# Patient Record
Sex: Female | Born: 1980 | Race: White | Hispanic: No | Marital: Married | Smoking: Current some day smoker
Health system: Southern US, Community
[De-identification: ages and names within clinical notes are randomized; demographics above are authoritative.]

## PROBLEM LIST (undated history)

## (undated) DIAGNOSIS — A749 Chlamydial infection, unspecified: Secondary | ICD-10-CM

## (undated) DIAGNOSIS — N946 Dysmenorrhea, unspecified: Secondary | ICD-10-CM

## (undated) DIAGNOSIS — N760 Acute vaginitis: Secondary | ICD-10-CM

## (undated) DIAGNOSIS — F32A Depression, unspecified: Secondary | ICD-10-CM

## (undated) DIAGNOSIS — K626 Ulcer of anus and rectum: Secondary | ICD-10-CM

## (undated) DIAGNOSIS — F329 Major depressive disorder, single episode, unspecified: Secondary | ICD-10-CM

## (undated) DIAGNOSIS — N879 Dysplasia of cervix uteri, unspecified: Secondary | ICD-10-CM

## (undated) DIAGNOSIS — L509 Urticaria, unspecified: Secondary | ICD-10-CM

## (undated) DIAGNOSIS — E559 Vitamin D deficiency, unspecified: Secondary | ICD-10-CM

## (undated) DIAGNOSIS — N83209 Unspecified ovarian cyst, unspecified side: Secondary | ICD-10-CM

## (undated) DIAGNOSIS — B9689 Other specified bacterial agents as the cause of diseases classified elsewhere: Secondary | ICD-10-CM

## (undated) DIAGNOSIS — O24419 Gestational diabetes mellitus in pregnancy, unspecified control: Secondary | ICD-10-CM

## (undated) DIAGNOSIS — R87629 Unspecified abnormal cytological findings in specimens from vagina: Secondary | ICD-10-CM

## (undated) DIAGNOSIS — N920 Excessive and frequent menstruation with regular cycle: Secondary | ICD-10-CM

## (undated) HISTORY — DX: Ulcer of anus and rectum: K62.6

## (undated) HISTORY — DX: Unspecified abnormal cytological findings in specimens from vagina: R87.629

## (undated) HISTORY — DX: Other specified bacterial agents as the cause of diseases classified elsewhere: N76.0

## (undated) HISTORY — DX: Dysplasia of cervix uteri, unspecified: N87.9

## (undated) HISTORY — DX: Urticaria, unspecified: L50.9

## (undated) HISTORY — PX: FINGER SURGERY: SHX640

## (undated) HISTORY — DX: Unspecified ovarian cyst, unspecified side: N83.209

## (undated) HISTORY — DX: Other specified bacterial agents as the cause of diseases classified elsewhere: B96.89

## (undated) HISTORY — DX: Major depressive disorder, single episode, unspecified: F32.9

## (undated) HISTORY — DX: Chlamydial infection, unspecified: A74.9

## (undated) HISTORY — PX: PILONIDAL CYST EXCISION: SHX744

## (undated) HISTORY — PX: CRYOTHERAPY: SHX1416

## (undated) HISTORY — PX: WISDOM TOOTH EXTRACTION: SHX21

## (undated) HISTORY — DX: Gestational diabetes mellitus in pregnancy, unspecified control: O24.419

## (undated) HISTORY — DX: Vitamin D deficiency, unspecified: E55.9

## (undated) HISTORY — DX: Depression, unspecified: F32.A

## (undated) HISTORY — DX: Dysmenorrhea, unspecified: N94.6

## (undated) HISTORY — DX: Excessive and frequent menstruation with regular cycle: N92.0

---

## 2012-03-28 ENCOUNTER — Ambulatory Visit: Payer: Self-pay | Admitting: General Practice

## 2013-03-30 ENCOUNTER — Ambulatory Visit: Payer: Self-pay | Admitting: Podiatry

## 2013-04-06 ENCOUNTER — Ambulatory Visit: Payer: Self-pay | Admitting: Podiatry

## 2013-04-17 ENCOUNTER — Ambulatory Visit (INDEPENDENT_AMBULATORY_CARE_PROVIDER_SITE_OTHER): Payer: No Typology Code available for payment source | Admitting: Podiatry

## 2013-04-17 ENCOUNTER — Encounter: Payer: Self-pay | Admitting: Podiatry

## 2013-04-17 VITALS — BP 113/71 | HR 64 | Resp 16 | Ht 61.0 in | Wt 145.0 lb

## 2013-04-17 DIAGNOSIS — M7750 Other enthesopathy of unspecified foot: Secondary | ICD-10-CM

## 2013-04-17 DIAGNOSIS — Q828 Other specified congenital malformations of skin: Secondary | ICD-10-CM

## 2013-04-17 MED ORDER — TRIAMCINOLONE ACETONIDE 10 MG/ML IJ SUSP
10.0000 mg | Freq: Once | INTRAMUSCULAR | Status: AC
  Administered 2013-04-17: 10 mg

## 2013-04-18 NOTE — Progress Notes (Signed)
Subjective:     Patient ID: Deborah Mccarthy, female   DOB: [birth year], 32 y.o.   MRN: [id number]  HPI patient is found to have lesions plantar aspect left that are improved over previous visit. She has developed inflammation base of fifth metatarsal right with fluid buildup and deep keratotic tissue formation   Review of Systems     Objective:   Physical Exam Neurovascular status intact with a painful inflamed lesion plantar right and lesions noted on both feet that have improved    Assessment:     Inflammation of base of fifth metatarsal right with fluid buildup and lesion formation noted of both feet    Plan:     Injected the base of fifth metatarsal 3 mg Kenalog 5 mg Xylocaine Marcaine mixture and did deep debridement of lesions on both feet with reappoint to occur in 3 months

## 2013-07-17 ENCOUNTER — Ambulatory Visit (INDEPENDENT_AMBULATORY_CARE_PROVIDER_SITE_OTHER): Payer: No Typology Code available for payment source | Admitting: Podiatry

## 2013-07-17 ENCOUNTER — Encounter: Payer: Self-pay | Admitting: Podiatry

## 2013-07-17 VITALS — BP 104/47 | HR 72 | Resp 16

## 2013-07-17 DIAGNOSIS — L84 Corns and callosities: Secondary | ICD-10-CM

## 2013-07-17 DIAGNOSIS — Q828 Other specified congenital malformations of skin: Secondary | ICD-10-CM

## 2013-07-17 NOTE — Progress Notes (Signed)
Subjective:     Patient ID: Deborah Mccarthy, female   DOB: September 18, 1980, 33 y.o.   MRN: [id number]  HPI patient presents with painful calluses of both feet and also the fact that she is now [redacted] weeks pregnant   Review of Systems     Objective:   Physical Exam Neurovascular status intact with severe keratotic lesions sub-left second metatarsal right fifth metatarsal and others that are not as deep    Assessment:     Porokeratosis with keratotic tissue formation    Plan:     Debridement of lesions on both feet with no iatrogenic bleeding noted

## 2013-11-13 ENCOUNTER — Encounter: Payer: Self-pay | Admitting: Podiatry

## 2013-11-13 ENCOUNTER — Ambulatory Visit (INDEPENDENT_AMBULATORY_CARE_PROVIDER_SITE_OTHER): Payer: No Typology Code available for payment source | Admitting: Podiatry

## 2013-11-13 DIAGNOSIS — Q828 Other specified congenital malformations of skin: Secondary | ICD-10-CM

## 2013-11-13 DIAGNOSIS — L84 Corns and callosities: Secondary | ICD-10-CM

## 2013-11-13 NOTE — Progress Notes (Signed)
Subjective:     Patient ID: Deborah Mccarthy, female   DOB: May 28, 1980, 33 y.o.   MRN: [id number]  HPI patient has extremely thick calluses on the bottom of both feet with the left one being nucleated and more painful   Review of Systems     Objective:   Physical Exam Neurovascular status intact with severe thick keratotic lesions plantar aspect of both feet with a nucleated core left    Assessment:     Corn callus with a porokeratosis of the left second metatarsal    Plan:     Debridement lesions on both feet and reappoint when symptomatic

## 2014-01-04 ENCOUNTER — Ambulatory Visit: Payer: No Typology Code available for payment source | Admitting: Podiatry

## 2014-01-04 ENCOUNTER — Ambulatory Visit (INDEPENDENT_AMBULATORY_CARE_PROVIDER_SITE_OTHER): Payer: No Typology Code available for payment source | Admitting: Podiatry

## 2014-01-04 VITALS — BP 110/63 | HR 82 | Resp 16

## 2014-01-04 DIAGNOSIS — L84 Corns and callosities: Secondary | ICD-10-CM

## 2014-01-04 DIAGNOSIS — M216X9 Other acquired deformities of unspecified foot: Secondary | ICD-10-CM

## 2014-01-04 DIAGNOSIS — M2040 Other hammer toe(s) (acquired), unspecified foot: Secondary | ICD-10-CM

## 2014-01-04 NOTE — Progress Notes (Signed)
Subjective:     Patient ID: Deborah Mccarthy, female   DOB: March 12, 1981, 33 y.o.   MRN: [id number]  HPI patient presents stating this lesion on the bottom my foot has awful and I may have to have it fixed. I'm due to have my baby in the beginning of October and I will be off of work for 3 months sore immediately get it done during that time   Review of Systems  All other systems reviewed and are negative.      Objective:   Physical Exam Neurovascular status intact with muscle strength adequate and range of motion subtalar and midtarsal joint within normal limits. Patient's found to have a painful keratotic lesion second metatarsal left and elevation of the second toe with inflammation and pain around the lesion itself. Also has lesions on the fifth toe and elevation of the fourth toe left that can become painful    Assessment:     Plantarflexed metatarsal with plantar keratotic lesion it's becoming increasingly tender and is not responding well to aggressive trimming    Plan:     Reviewed condition and discussed possibilities for elevating osteotomy removal of plantar skin wedge and correction of hammertoe second fourth and fifth toes left foot teary at we will see her back when symptomatic again and this decision will be deferred till then with possibility for surgical intervention depending on response to trimming which was accomplished today

## 2014-02-22 ENCOUNTER — Ambulatory Visit (INDEPENDENT_AMBULATORY_CARE_PROVIDER_SITE_OTHER): Payer: No Typology Code available for payment source | Admitting: Podiatry

## 2014-02-22 DIAGNOSIS — Q667 Congenital pes cavus: Secondary | ICD-10-CM

## 2014-02-22 DIAGNOSIS — M216X9 Other acquired deformities of unspecified foot: Secondary | ICD-10-CM

## 2014-02-22 DIAGNOSIS — L84 Corns and callosities: Secondary | ICD-10-CM

## 2014-02-24 NOTE — Progress Notes (Signed)
Subjective:     Patient ID: Deborah Mccarthy, female   DOB: Sep 04, 1980, 33 y.o.   MRN: [id number]  HPI patient presents stating the callus on my left foot is really bothering me and I had relief for about 4 weeks. Due to have my baby in 2 weeks   Review of Systems     Objective:   Physical Exam Neurovascular status intact with continued severe keratotic lesion subsecond metatarsal left with deep core noted    Assessment:     Lesion that is due to mechanical dysfunction and keratotic tissue formation    Plan:     Debris the lesion and discussed possibility for surgery during postop recovery. Which we may or may not do depending on continued response

## 2014-03-12 ENCOUNTER — Observation Stay: Payer: Self-pay

## 2014-03-13 ENCOUNTER — Inpatient Hospital Stay: Payer: Self-pay | Admitting: Obstetrics and Gynecology

## 2014-03-13 LAB — CBC WITH DIFFERENTIAL/PLATELET
Basophil #: 0.3 10*3/uL — ABNORMAL HIGH (ref 0.0–0.1)
Basophil %: 1.1 %
EOS PCT: 0.4 %
Eosinophil #: 0.1 10*3/uL (ref 0.0–0.7)
HCT: 35.3 % (ref 35.0–47.0)
HGB: 11.1 g/dL — ABNORMAL LOW (ref 12.0–16.0)
LYMPHS ABS: 1.8 10*3/uL (ref 1.0–3.6)
Lymphocyte %: 7.6 %
MCH: 28.8 pg (ref 26.0–34.0)
MCHC: 31.4 g/dL — ABNORMAL LOW (ref 32.0–36.0)
MCV: 92 fL (ref 80–100)
MONO ABS: 1.6 x10 3/mm — AB (ref 0.2–0.9)
Monocyte %: 6.5 %
NEUTROS ABS: 20.3 10*3/uL — AB (ref 1.4–6.5)
Neutrophil %: 84.4 %
PLATELETS: 340 10*3/uL (ref 150–440)
RBC: 3.85 10*6/uL (ref 3.80–5.20)
RDW: 12.9 % (ref 11.5–14.5)
WBC: 24 10*3/uL — AB (ref 3.6–11.0)

## 2014-03-13 LAB — GC/CHLAMYDIA PROBE AMP

## 2014-03-14 LAB — HEMATOCRIT: HCT: 27.9 % — AB (ref 35.0–47.0)

## 2014-03-15 LAB — URINE CULTURE

## 2014-03-25 ENCOUNTER — Encounter: Payer: Self-pay | Admitting: Podiatry

## 2014-04-05 ENCOUNTER — Ambulatory Visit (INDEPENDENT_AMBULATORY_CARE_PROVIDER_SITE_OTHER): Payer: No Typology Code available for payment source | Admitting: Podiatry

## 2014-04-05 ENCOUNTER — Encounter: Payer: Self-pay | Admitting: Podiatry

## 2014-04-05 VITALS — BP 116/59 | HR 91 | Resp 16

## 2014-04-05 DIAGNOSIS — L84 Corns and callosities: Secondary | ICD-10-CM

## 2014-04-05 DIAGNOSIS — M216X9 Other acquired deformities of unspecified foot: Secondary | ICD-10-CM

## 2014-04-05 DIAGNOSIS — Q667 Congenital pes cavus: Secondary | ICD-10-CM

## 2014-04-07 NOTE — Progress Notes (Signed)
Subjective:     Patient ID: Deborah Mccarthy, female   DOB: 09-18-1980, 33 y.o.   MRN: [id number]  HPIpatient presents with painful sub-callus second metatarsal left that is very painful when pressed and is causing difficulty with walking. She is just had her child and at this time has not been weightbearing so it's not been as tender as previously   Review of Systems     Objective:   Physical Exam Neurovascular status intact no change in health history with severe keratotic lesion subsecond metatarsal left it's painful when pressed and makes ambulation difficult    Assessment:     Plantar flexed second metatarsal left with pain when pressed    Plan:     H&P was performed condition discussed and reviewed again elevating osteotomy with excision of plantar lesion. Today deep debridement of lesion accomplished which will be done for the short-term with eventual surgery which will be necessary due to the long-term nature of this condition

## 2014-08-09 ENCOUNTER — Ambulatory Visit (INDEPENDENT_AMBULATORY_CARE_PROVIDER_SITE_OTHER): Payer: No Typology Code available for payment source | Admitting: Podiatry

## 2014-08-09 DIAGNOSIS — M7752 Other enthesopathy of left foot: Secondary | ICD-10-CM

## 2014-08-09 DIAGNOSIS — M779 Enthesopathy, unspecified: Secondary | ICD-10-CM

## 2014-08-09 DIAGNOSIS — L84 Corns and callosities: Secondary | ICD-10-CM | POA: Diagnosis not present

## 2014-08-09 MED ORDER — TRIAMCINOLONE ACETONIDE 10 MG/ML IJ SUSP
10.0000 mg | Freq: Once | INTRAMUSCULAR | Status: AC
  Administered 2014-08-09: 10 mg

## 2014-08-09 NOTE — Progress Notes (Signed)
Subjective:     Patient ID: Deborah Mccarthy, female   DOB: Aug 15, 1980, 34 y.o.   MRN: [id number]  HPI presents stating that the calluses started to hurt me again and there is 1 on my right foot that's inflamed with fluid and it is also sore   Review of Systems     Objective:   Physical Exam Neurovascular status intact with muscle strength adequate and noted to have severe keratotic lesion subsecond metatarsal left sub-fifth metatarsal right with fluid buildup around the base of the fifth metatarsal right and pain noted with pressure with fluid accumulation    Assessment:     Inflammatory capsulitis base of fifth metatarsal right and keratotic lesion second metatarsal left fifth metatarsal right    Plan:     Injected the base of the fifth metatarsal 3 mg dexamethasone Kenalog 5 mg Xylocaine and did deep debridement of lesion on right and left foot with no iatrogenic bleeding noted

## 2014-09-06 ENCOUNTER — Ambulatory Visit: Payer: No Typology Code available for payment source | Admitting: Podiatry

## 2014-10-01 NOTE — H&P (Signed)
L&D Evaluation:  History:  HPI 34 year old G6 P10 with EDC=03/07/14 by LMP=05/31/2013 and c/w 6wk5d ultrasound presents at 40 6/7 weeks with c/o intense ctxs since 0300 this AM. Was seen yesterday in L&D for ctxs and sent home with cx dilated to 1 cm.  She has progressed to 4 cm. Denies VB (had spotting after membranes stripped) or LOF. Recently had cough, nasal/sinus congestion...which has largely resolved. No fever, dysuria, sore throat. Prenatal care at [facility] remarkable for Chlamydia and Trichimonas (negative TOC), AMA (normal Informaseq and normal anatomy scan), and mild anemia. Patient is a smoker(cigars)-stopped smoking early pregnancy.  LABS: A POS, VI, RI, GBS negative. Elevated one hr=140 with normal 3hr GTT. Given TDAP 8/6. Desires to breastfeed. Plans minipill. OB HX: SVD 2005 7#5oz female. Elective AB 2007, 2014. SAB 2009   Presents with contractions   Patient's Medical History cervical dysplasia   Patient's Surgical History cryo 2008, finger surgery 2004, excision of pilonidal cyst 2008, wisdom teeth extraction 2002   Medications Pre Natal Vitamins   Allergies NKDA   Social History tobacco   Family History Non-Contributory   ROS:  ROS see HPI   Exam:  Vital Signs stable  afebrile   General breathing with contractions   Mental Status clear   Chest rhonchi cleared with cough   Heart normal sinus rhythm, no murmur/gallop/rubs   Abdomen gravid, tender with contractions   Estimated Fetal Weight Average for gestational age, 7-10 EFW   Fetal Position cephalic   Edema 1+   Pelvic 4cm/75%/-1 on arrival, now 6cm per RN exam   Mebranes Intact   FHT normal rate with no decels, 140s with minimal variability, initially, improved with IV fluids   Ucx q4-5 with coupling   Skin dry   Other WBC=24K   Impression:  Impression IUP at 40 6/7 weeks in labor. Leukocytosis   Plan:  Plan EFM/NST, monitor contractions and for cervical change, fluids, Stadol for pain  which she has already received. Anesthesia consulted for epidural-won't be able to do due to elevated WBCs.   Electronic Signatures: Karene Fry (CNM)  (Signed 21-Oct-15 08:51)  Authored: L&D Evaluation   Last Updated: 21-Oct-15 08:51 by Karene Fry (CNM)

## 2014-12-19 ENCOUNTER — Ambulatory Visit (INDEPENDENT_AMBULATORY_CARE_PROVIDER_SITE_OTHER): Payer: PRIVATE HEALTH INSURANCE | Admitting: Podiatry

## 2014-12-19 DIAGNOSIS — Q828 Other specified congenital malformations of skin: Secondary | ICD-10-CM

## 2014-12-19 DIAGNOSIS — B070 Plantar wart: Secondary | ICD-10-CM | POA: Diagnosis not present

## 2014-12-19 NOTE — Progress Notes (Signed)
Subjective:     Patient ID: Deborah Mccarthy, female   DOB: [birth year], 34 y.o.   MRN: [id number]  HPI 34 year old female presents the office today for follow-up evaluation and continued care of calluses to bilateral feet. She states that she periodically comes the office of the areas trimmed once the become painful. She states that of left Achilles insert become painful particularly with weightbearing. She denies any redness or drainage. Denies any acute changes since last appointment and no other complaints at this time.  Review of Systems  All other systems reviewed and are negative.      Objective:   Physical Exam AAO x3, NAD DP/PT pulses palpable bilaterally, CRT less than 3 seconds Protective sensation intact with Simms Weinstein monofilament, Achilles tendon reflex intact Submetatarsal 2 and 5 bilaterally are hyperkeratotic lesions. Lesions are also present to medial hallux. Particularly submetatarsal 2 are deep deep, annular lesions. Upon debridement there is no underlying ulceration, drainage or other clinical signs of infection. There is tenderness to palpation overlying hyperkeratotic lesions prior to debridement. No areas of tenderness to bilateral lower extremities. MMT 5/5, ROM WNL.  No open lesions or pre-ulcerative lesions.  No overlying edema, erythema, increase in warmth to bilateral lower extremities.  No pain with calf compression, swelling, warmth, erythema bilaterally.      Assessment:     Symptomatic porokeratosis    Plan:     -Treatment options discussed including all alternatives, risks, and complications -Lesions were sharply debrided 6 without complication/bleeding. Over submetatarsal 2 a donut pad was applied followed by salinocaine and a DSD. Post procedure instructions were discussed the patient. Monitor for any clinical signs or symptoms of infection and directed to call the office immediately should any occur or go to the ER. -Follow-up as needed or sooner  if any problems arise. In the meantime, encouraged to call the office with any questions, concerns, change in symptoms.   Celesta Gentile, DPM

## 2015-07-09 ENCOUNTER — Other Ambulatory Visit: Payer: Self-pay | Admitting: Emergency Medicine

## 2015-07-09 MED ORDER — BUPROPION HCL ER (XL) 300 MG PO TB24: 300.0000 mg | ORAL_TABLET | Freq: Every day | ORAL | Status: DC

## 2015-07-09 NOTE — Telephone Encounter (Signed)
Received a faxed medication request from [facility] [place] [address].  Please advise.  Thank you.

## 2015-07-09 NOTE — Telephone Encounter (Signed)
ok'd refill on wellbutrin, refused request for flexeril, don't like for patients to be on this for long period of time

## 2015-07-11 ENCOUNTER — Encounter: Payer: Self-pay | Admitting: Physician Assistant

## 2015-07-11 ENCOUNTER — Ambulatory Visit: Payer: Self-pay | Admitting: Physician Assistant

## 2015-07-11 VITALS — BP 119/65 | HR 78 | Temp 98.3°F

## 2015-07-11 DIAGNOSIS — M62838 Other muscle spasm: Secondary | ICD-10-CM

## 2015-07-11 MED ORDER — METHOCARBAMOL 750 MG PO TABS: 750.0000 mg | ORAL_TABLET | Freq: Four times a day (QID) | ORAL | Status: DC

## 2015-07-11 NOTE — Progress Notes (Signed)
   Subjective:Neck muscle spasm    Patient ID: Deborah Mccarthy, female    DOB: Aug 30, 1980, 35 y.o.   MRN: [id number]  HPI Patient c/o muscle spasms radiating from neck to left shoulder. States increase spasm after Chiropractor sessions. Denies loss of sensation or strength.    Review of Systems Chronic neck/back pain.    Objective:   Physical Exam No spinal deformity. F/E ROM of neck. Left Scapular muscle spasms with over head reaching.       Assessment & Plan:Left scapular muscle strain/spasm. Trial of Robaxin..  Follow if no improvement or worsen of compliant.

## 2015-10-02 ENCOUNTER — Encounter: Payer: Self-pay | Admitting: Podiatry

## 2015-10-02 ENCOUNTER — Ambulatory Visit (INDEPENDENT_AMBULATORY_CARE_PROVIDER_SITE_OTHER): Payer: Managed Care, Other (non HMO) | Admitting: Podiatry

## 2015-10-02 DIAGNOSIS — L84 Corns and callosities: Secondary | ICD-10-CM

## 2015-10-02 DIAGNOSIS — Q828 Other specified congenital malformations of skin: Secondary | ICD-10-CM

## 2015-10-02 NOTE — Progress Notes (Signed)
Patient ID: Deborah Mccarthy, female   DOB: [birth year], 35 y.o.   MRN: [id number]  Subjective: 35 year old female presents the office today for follow-up evaluation and continued care of calluses to bilateral feet. She states they have started to become painful again. No redness or swelling or drainage.   Objective: AAO x3, NAD DP/PT pulses palpable bilaterally, CRT less than 3 seconds Submetatarsal 2 and 5 bilaterally are hyperkeratotic lesions. Lesions are also present to medial hallux. Particularly submetatarsal 2 on the left and sub 5 on the right are deep, annular lesions. Upon debridement there is no underlying ulceration, drainage or other clinical signs of infection. There is tenderness to palpation overlying hyperkeratotic lesions prior to debridement. No areas of tenderness to bilateral lower extremities. MMT 5/5, ROM WNL.  No open lesions or pre-ulcerative lesions.  No overlying edema, erythema, increase in warmth to bilateral lower extremities.  No pain with calf compression, swelling, warmth, erythema bilaterally.   Assessment: Symptomatic porokeratosis  Plan: -Treatment options discussed including all alternatives, risks, and complications -Lesions were sharply debrided 6 without complication/bleeding. Over submetatarsal 2 on the left and sub 5 on the right a donut pad was applied followed by salinocaine and a DSD. Post procedure instructions were discussed the patient. Monitor for any clinical signs or symptoms of infection and directed to call the office immediately should any occur or go to the ER. -Follow-up as needed or sooner if any problems arise. In the meantime, encouraged to call the office with any questions, concerns, change in symptoms.   Celesta Gentile, DPM

## 2015-10-02 NOTE — Patient Instructions (Signed)
Can use urea cream.

## 2015-12-25 ENCOUNTER — Encounter: Payer: Self-pay | Admitting: Podiatry

## 2015-12-25 ENCOUNTER — Ambulatory Visit (INDEPENDENT_AMBULATORY_CARE_PROVIDER_SITE_OTHER): Payer: Managed Care, Other (non HMO) | Admitting: Podiatry

## 2015-12-25 DIAGNOSIS — Q828 Other specified congenital malformations of skin: Secondary | ICD-10-CM

## 2015-12-25 DIAGNOSIS — M79673 Pain in unspecified foot: Secondary | ICD-10-CM

## 2015-12-29 NOTE — Progress Notes (Signed)
Patient ID: Deborah Mccarthy, female   DOB: February 09, 1981, 35 y.o.   MRN: [id number]  Subjective: 35 year old female presents the office today for painful calluses to bilateral feet. She states they have started to become painful again. No redness or swelling or drainage. No new complaints.   Objective: AAO x3, NAD DP/PT pulses palpable bilaterally, CRT less than 3 seconds Submetatarsal 2 and 5 bilaterally are hyperkeratotic lesions. Lesions are also present to medial hallux. Particularly submetatarsal 2 on the left and sub 5 on the right are deep, annular lesions. Upon debridement there is no underlying ulceration, drainage or other clinical signs of infection. There is tenderness to palpation overlying hyperkeratotic lesions prior to debridement. No areas of tenderness to bilateral lower extremities. MMT 5/5, ROM WNL.  No open lesions or pre-ulcerative lesions.  No overlying edema, erythema, increase in warmth to bilateral lower extremities.  No pain with calf compression, swelling, warmth, erythema bilaterally.   Assessment: Symptomatic porokeratosis  Plan: -Treatment options discussed including all alternatives, risks, and complications -Lesions were sharply debrided 6 without complication/bleeding. Over submetatarsal 2 on the left and sub 5 on the right a donut pad was applied followed by salinocaine and a DSD. Post procedure instructions were discussed the patient. Monitor for any clinical signs or symptoms of infection and directed to call the office immediately should any occur or go to the ER. -Follow-up as needed or sooner if any problems arise. In the meantime, encouraged to call the office with any questions, concerns, change in symptoms.   Celesta Gentile, DPM

## 2016-02-02 ENCOUNTER — Encounter: Payer: Self-pay | Admitting: Physician Assistant

## 2016-02-02 ENCOUNTER — Ambulatory Visit: Payer: Self-pay | Admitting: Physician Assistant

## 2016-02-02 VITALS — BP 120/60 | HR 72 | Temp 98.7°F

## 2016-02-02 DIAGNOSIS — F329 Major depressive disorder, single episode, unspecified: Secondary | ICD-10-CM

## 2016-02-02 DIAGNOSIS — M542 Cervicalgia: Secondary | ICD-10-CM

## 2016-02-02 DIAGNOSIS — F32A Depression, unspecified: Secondary | ICD-10-CM

## 2016-02-02 MED ORDER — BACLOFEN 10 MG PO TABS: 10.0000 mg | ORAL_TABLET | Freq: Three times a day (TID) | ORAL | 0 refills | Status: DC

## 2016-02-02 MED ORDER — BUPROPION HCL ER (XL) 300 MG PO TB24: 300.0000 mg | ORAL_TABLET | Freq: Every day | ORAL | 11 refills | Status: DC

## 2016-02-02 NOTE — Progress Notes (Signed)
S: here for med refill of wellbutrin , states is doing well on the medication, also ?if can have a rx for muscle relaxer due to trigger point pain in neck/shoulders , doesn't take med all of the time just occasionally, no numbness or tingling  O: vitals wnl, nad, good mood and affect, cspine nontender, + trigger point tenderness in shoulders b/l, grips = b/l, n/v intact  A: med refill, (depression), shoulder and neck painj  P: wellbutrin xl 300mg  qd, baclofen

## 2016-11-01 ENCOUNTER — Ambulatory Visit (INDEPENDENT_AMBULATORY_CARE_PROVIDER_SITE_OTHER): Payer: Managed Care, Other (non HMO) | Admitting: Obstetrics & Gynecology

## 2016-11-01 ENCOUNTER — Encounter: Payer: Self-pay | Admitting: Obstetrics & Gynecology

## 2016-11-01 VITALS — BP 112/58 | HR 87 | Ht 61.0 in | Wt 158.0 lb

## 2016-11-01 DIAGNOSIS — N9089 Other specified noninflammatory disorders of vulva and perineum: Secondary | ICD-10-CM | POA: Diagnosis not present

## 2016-11-01 DIAGNOSIS — Z124 Encounter for screening for malignant neoplasm of cervix: Secondary | ICD-10-CM | POA: Diagnosis not present

## 2016-11-01 DIAGNOSIS — Z01419 Encounter for gynecological examination (general) (routine) without abnormal findings: Secondary | ICD-10-CM

## 2016-11-01 DIAGNOSIS — F17200 Nicotine dependence, unspecified, uncomplicated: Secondary | ICD-10-CM | POA: Diagnosis not present

## 2016-11-01 DIAGNOSIS — Z0000 Encounter for general adult medical examination without abnormal findings: Secondary | ICD-10-CM

## 2016-11-01 MED ORDER — NORETHIN ACE-ETH ESTRAD-FE 1-20 MG-MCG(24) PO CHEW: 1.0000 | CHEWABLE_TABLET | Freq: Every day | ORAL | 12 refills | Status: DC

## 2016-11-01 NOTE — Progress Notes (Signed)
HPI:      [name] is a 36 y.o. G1P0 who LMP was Patient's last menstrual period was 10/01/2016 (approximate)., she presents today for her annual examination. The patient has no complaints today. The patient is sexually active. Her last pap: approximate date 2015 and was normal. The patient does perform self breast exams.  There is notable family history of breast or ovarian cancer in her family.  The patient has regular exercise: yes.  The patient denies current symptoms of depression.  Stopped tobacco use in January.  GYN History: Contraception: OCP (estrogen/progesterone). No periods on this regimen, wants to continue.  PMHx: History reviewed. No pertinent past medical history. History reviewed. No pertinent surgical history. Family History  Problem Relation Age of Onset  . Breast cancer Maternal Grandmother 28  . Colon cancer Other    Social History  Substance Use Topics  . Smoking status: Smoker, Current Status Unknown    Types: Cigarettes  . Smokeless tobacco: Never Used  . Alcohol use No    Current Outpatient Prescriptions:  .  baclofen (LIORESAL) 10 MG tablet, Take 1 tablet (10 mg total) by mouth 3 (three) times daily., Disp: 30 each, Rfl: 0 .  buPROPion (WELLBUTRIN XL) 300 MG 24 hr tablet, Take 1 tablet (300 mg total) by mouth daily., Disp: 30 tablet, Rfl: 11 .  Norethin Ace-Eth Estrad-FE (MIBELAS 24 FE) 1-20 MG-MCG(24) CHEW, Chew 1 tablet by mouth daily., Disp: 28 tablet, Rfl: 12 Allergies: Patient has no known allergies.  Review of Systems  Constitutional: Negative for chills, fever and malaise/fatigue.  HENT: Negative for congestion, sinus pain and sore throat.   Eyes: Negative for blurred vision and pain.  Respiratory: Negative for cough and wheezing.   Cardiovascular: Negative for chest pain and leg swelling.  Gastrointestinal: Negative for abdominal pain, constipation, diarrhea, heartburn, nausea and vomiting.  Genitourinary: Negative for dysuria,  frequency, hematuria and urgency.  Musculoskeletal: Negative for back pain, joint pain, myalgias and neck pain.  Skin: Negative for itching and rash.  Neurological: Negative for dizziness, tremors and weakness.  Endo/Heme/Allergies: Does not bruise/bleed easily.  Psychiatric/Behavioral: Negative for depression. The patient is not nervous/anxious and does not have insomnia.     Objective: BP (!) 112/58 (BP Location: Right Arm, Patient Position: Sitting, Cuff Size: Normal)   Pulse 87   Ht 5\' 1"  (1.549 m)   Wt 158 lb (71.7 kg)   LMP 10/01/2016 (Approximate)   Breastfeeding? No   BMI 29.85 kg/m   Filed Weights   11/01/16 0905  Weight: 158 lb (71.7 kg)   Body mass index is 29.85 kg/m. Physical Exam  Constitutional: She is oriented to person, place, and time. She appears well-developed and well-nourished. No distress.  Genitourinary: Rectum normal, vagina normal and uterus normal. Pelvic exam was performed with patient supine. There is no rash or lesion on the right labia. There is no rash or lesion on the left labia. Vagina exhibits no lesion. No bleeding in the vagina. Right adnexum does not display mass and does not display tenderness. Left adnexum does not display mass and does not display tenderness. Cervix does not exhibit motion tenderness, lesion, friability or polyp.   Uterus is mobile and midaxial. Uterus is not enlarged or exhibiting a mass.  HENT:  Head: Normocephalic and atraumatic. Head is without laceration.  Right Ear: Hearing normal.  Left Ear: Hearing normal.  Nose: No epistaxis.  No foreign bodies.  Mouth/Throat: Uvula is midline, oropharynx is clear and moist and  mucous membranes are normal.  Eyes: Pupils are equal, round, and reactive to light.  Neck: Normal range of motion. Neck supple. No thyromegaly present.  Cardiovascular: Normal rate and regular rhythm.  Exam reveals no gallop and no friction rub.   No murmur heard. Pulmonary/Chest: Effort normal and breath  sounds normal. No respiratory distress. She has no wheezes. Right breast exhibits no mass, no skin change and no tenderness. Left breast exhibits no mass, no skin change and no tenderness.  Abdominal: Soft. Bowel sounds are normal. She exhibits no distension. There is no tenderness. There is no rebound.  Musculoskeletal: Normal range of motion.  Neurological: She is alert and oriented to person, place, and time. No cranial nerve deficit.  Skin: Skin is warm and dry.  Psychiatric: She has a normal mood and affect. Judgment normal.  Vitals reviewed.   Assessment:  ANNUAL EXAM 1. Annual physical exam   2. Screening for cervical cancer   3. Skin tag of female perineum   4. Tobacco dependence    Screening Plan:            1.  Cervical Screening-  Pap smear done today  2. Breast screening- Exam annually and mammogram>40 planned   3. Colonoscopy every 10 years, Hemoccult testing - after age 51  4. Labs managed by PCP  5. Counseling for contraception: oral contraceptives (estrogen/progesterone)  Other:  1. Annual physical exam  2. Screening for cervical cancer  3. Skin tag of female perineum Plan visit for excision, prefers a Friday  4. Tobacco dependence Stopped since January.  If restarts need to discuss again alternatives to OCPs.    F/U  Return Any Friday for procedure visit (any room, skin tag excision).  Barnett Applebaum, MD, [facility], [facility] 11/01/2016  9:46 AM

## 2016-11-01 NOTE — Addendum Note (Signed)
Addended by: Gae Dry on: 11/01/2016 10:59 AM   Modules accepted: Orders

## 2016-11-04 ENCOUNTER — Encounter: Payer: Self-pay | Admitting: Obstetrics & Gynecology

## 2016-11-04 LAB — IGP, APTIMA HPV
HPV Aptima: NEGATIVE
PAP SMEAR COMMENT: 0

## 2016-11-23 ENCOUNTER — Encounter: Payer: Self-pay | Admitting: Obstetrics & Gynecology

## 2016-11-26 ENCOUNTER — Encounter: Payer: Self-pay | Admitting: Obstetrics & Gynecology

## 2016-11-26 ENCOUNTER — Ambulatory Visit (INDEPENDENT_AMBULATORY_CARE_PROVIDER_SITE_OTHER): Payer: Managed Care, Other (non HMO) | Admitting: Obstetrics & Gynecology

## 2016-11-26 VITALS — BP 110/70 | HR 80 | Ht 61.0 in | Wt 157.0 lb

## 2016-11-26 DIAGNOSIS — N9089 Other specified noninflammatory disorders of vulva and perineum: Secondary | ICD-10-CM

## 2016-11-26 NOTE — Progress Notes (Signed)
VULVAR Skin Lesion Excision NOTE The indications for vulvar excision of lesion (rule out neoplasia, establish lichen sclerosus diagnosis) were reviewed.   Risks of the biopsy including pain, bleeding, infection, inadequate specimen, scarring and need for additional procedures  were discussed. The patient stated understanding and agreed to undergo procedure today. Consent was signed,  time out performed.   The patient's vulva was prepped with Betadine. 1% lidocaine was injected into area of concern. A scapel was used to excise raised .75cm skin lesion, with biopsy tissue was picked up with sterile forceps and sterile scissors were used to excise the lesion.  Small bleeding was noted and hemostasis was achieved using3-0 Vicryl Suture.  The patient tolerated the procedure well. Post-procedure instructions  (pelvic rest for one week) were given to the patient. The patient is to call with heavy bleeding, fever greater than 100.4, foul smelling vaginal discharge or other concerns.   F/u 2 weeks for evaluation of healing.  Minimize activity.  Barnett Applebaum, MD, [facility], [facility] 11/26/2016  11:53 AM

## 2016-11-27 ENCOUNTER — Other Ambulatory Visit: Payer: Self-pay | Admitting: Obstetrics & Gynecology

## 2016-11-27 NOTE — Addendum Note (Signed)
Addended by: Gae Dry on: 11/27/2016 08:42 AM   Modules accepted: Orders

## 2016-12-01 LAB — PATHOLOGY

## 2016-12-02 NOTE — Progress Notes (Signed)
Patient called.  Unable to reach patient. Pt has f/u appt scheduled

## 2016-12-16 ENCOUNTER — Encounter: Payer: Self-pay | Admitting: Obstetrics & Gynecology

## 2016-12-16 ENCOUNTER — Ambulatory Visit (INDEPENDENT_AMBULATORY_CARE_PROVIDER_SITE_OTHER): Payer: Managed Care, Other (non HMO) | Admitting: Obstetrics & Gynecology

## 2016-12-16 VITALS — BP 120/80 | HR 99 | Ht 61.0 in | Wt 157.0 lb

## 2016-12-16 DIAGNOSIS — L918 Other hypertrophic disorders of the skin: Secondary | ICD-10-CM

## 2016-12-16 DIAGNOSIS — N9089 Other specified noninflammatory disorders of vulva and perineum: Secondary | ICD-10-CM

## 2016-12-16 NOTE — Progress Notes (Signed)
  History of Present Illness:  Deborah Mccarthy is a 36 y.o. who had excision of vulvar lesion 2 weeks ago, w no c/o pain bleeding or infection since that time. Path revealed fibroepithelial polyp.  Pt has another one near it she would also like removed due to irritation w activity.  PMHx: She  has a past medical history of Abnormal Pap smear of vagina; BV (bacterial vaginosis); Cervical dysplasia; Chlamydia; Dysmenorrhea; Menorrhagia; Ovarian cyst; and Vitamin D deficiency. Also,  has a past surgical history that includes Cryotherapy; Finger surgery; Pilonidal cyst excision; and Wisdom tooth extraction., family history includes Breast cancer (age of onset: 26) in her maternal grandmother; Colon cancer in her maternal grandfather and other; Coronary artery disease in her father and maternal grandmother; Hypertension in her father.,  reports that she has been smoking Cigarettes.  She has never used smokeless tobacco. She reports that she does not drink alcohol or use drugs. No outpatient prescriptions have been marked as taking for the 12/16/16 encounter (Office Visit) with Gae Dry, MD.  . Also, has No Known Allergies..  Review of Systems  All other systems reviewed and are negative.   Physical Exam:  BP 120/80   Pulse 99   Ht 5\' 1"  (1.549 m)   Wt 157 lb (71.2 kg)   BMI 29.66 kg/m  Body mass index is 29.66 kg/m. Constitutional: Well nourished, well developed female in no acute distress.  Abdomen: diffusely non tender to palpation, non distended, and no masses, hernias Neuro: Grossly intact Psych:  Normal mood and affect. GU- well healed right labia where excision took place Left perineal skin tag pigmented and raised  Assessment:  Problem List Items Addressed This Visit      Genitourinary   Skin tag of female perineum   Relevant Orders   Pathology    Other Visit Diagnoses    Fibroepithelial polyp    -  Primary   Relevant Orders   Pathology    Plan: She will undergo  excision of second lesion today. First site well healed.  VULVAR Skin Lesion Excision NOTE The indications for vulvar excision of lesion (rule out neoplasia, establish lichen sclerosus diagnosis) were reviewed. Risks of the biopsy including pain, bleeding, infection, inadequate specimen, scarring and need for additional procedures were discussed. The patient stated understanding and agreed to undergo procedure today. Consent was signed,  time out performed.   The patient's vulva was prepped with Betadine. 1% lidocaine was injected into area of concern. A scapel was used to excise raised .75cm skin lesion, with biopsy tissue was picked up with sterile forceps and sterile scissors were used to excise the lesion. Small bleeding was noted and hemostasis was achieved using3-0 Vicryl Suture.  The patient tolerated the procedure well. Post-procedure instructions (pelvic rest for one week) were given to the patient. The patient is to call with heavy bleeding, fever greater than 100.4, foul smelling vaginal discharge or other concerns.   F/u 2 weeks for evaluation of healing.  Minimize activity.  Barnett Applebaum, MD, [facility], [facility] 12/16/2016  2:33 PM

## 2016-12-20 LAB — PATHOLOGY

## 2017-01-04 ENCOUNTER — Ambulatory Visit (INDEPENDENT_AMBULATORY_CARE_PROVIDER_SITE_OTHER): Payer: Managed Care, Other (non HMO) | Admitting: Obstetrics & Gynecology

## 2017-01-04 ENCOUNTER — Encounter: Payer: Self-pay | Admitting: Obstetrics & Gynecology

## 2017-01-04 VITALS — BP 100/60 | HR 80 | Ht 61.0 in | Wt 153.0 lb

## 2017-01-04 DIAGNOSIS — A630 Anogenital (venereal) warts: Secondary | ICD-10-CM

## 2017-01-04 NOTE — Progress Notes (Signed)
History of Present Illness:  Deborah Mccarthy is a 36 y.o. who had excision of vulvar lesion 6 weeks ago, and another lesion on left buttocks excised 2 weeks ago, w no c/o pain bleeding or infection since that time. 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    PMHx: She  has a past medical history of Abnormal Pap smear of vagina; BV (bacterial vaginosis); Cervical dysplasia; Chlamydia; Dysmenorrhea; Menorrhagia; Ovarian cyst; and Vitamin D deficiency. Also,  has a past surgical history that includes Cryotherapy; Finger surgery; Pilonidal cyst excision; and Wisdom tooth extraction., family history includes Breast cancer (age of onset: 3) in her maternal grandmother; Colon cancer in her maternal grandfather and other; Coronary artery disease in her father and maternal grandmother; Hypertension in her father.,  reports that she has been smoking Cigarettes.  She has never used smokeless tobacco. She reports that she does not drink alcohol or use drugs. Meds- none Also, has No Known Allergies..  Review of Systems  All other systems reviewed and are negative.  Physical Exam:  BP 100/60   Pulse 80   Ht 5\' 1"  (1.549 m)   Wt 153 lb (69.4 kg)   BMI 28.91 kg/m  Constitutional: Well nourished, well developed female in no acute distress.  Abdomen: diffusely non tender to palpation, non distended, and no masses, hernias Neuro: Grossly intact Psych:  Normal mood and affect. GU- well healed right labia where excision took place Left buttocks with eschar at site but no signs of infection or wound breakdown  1. Condyloma acuminata Consider Aldara if future lesions to avoid repeated excisions/ procedures. No new sx's now. Cont to heal Annual/PRN f/u  Deborah Applebaum, MD, [facility], [facility] 01/04/2017  9:15 AM

## 2017-06-27 ENCOUNTER — Other Ambulatory Visit: Payer: Self-pay | Admitting: Maternal Newborn

## 2017-06-27 DIAGNOSIS — Z3041 Encounter for surveillance of contraceptive pills: Secondary | ICD-10-CM

## 2017-06-27 MED ORDER — NORETHIN ACE-ETH ESTRAD-FE 1-20 MG-MCG PO TABS: 1.0000 | ORAL_TABLET | Freq: Every day | ORAL | 6 refills | Status: DC

## 2017-06-27 NOTE — Progress Notes (Signed)
Patient requested new OCP Rx due to cost.  Avel Sensor, CNM 06/27/2017  8:55 PM

## 2017-06-28 ENCOUNTER — Telehealth: Payer: Self-pay

## 2017-06-28 NOTE — Telephone Encounter (Signed)
Pt called after hour nurse c/o bloating and bc too expensive, wants generic.  Was adv to apply heat to lower abdomen.  On call provider called in generic bcp.

## 2017-06-29 NOTE — Telephone Encounter (Signed)
Pt calling triage stating that she has not heard back about RX for OCP generic. Lm with pt informing her that JS has sent in generic to [facility]. This should be more affordable.

## 2017-07-11 ENCOUNTER — Ambulatory Visit: Payer: Self-pay | Admitting: Emergency Medicine

## 2017-07-11 VITALS — BP 122/76 | HR 85 | Temp 98.3°F

## 2017-07-11 DIAGNOSIS — J209 Acute bronchitis, unspecified: Secondary | ICD-10-CM

## 2017-07-11 MED ORDER — AZITHROMYCIN 250 MG PO TABS: ORAL_TABLET | ORAL | 0 refills | Status: DC

## 2017-07-11 MED ORDER — BENZONATATE 200 MG PO CAPS: ORAL_CAPSULE | ORAL | 0 refills | Status: DC

## 2017-07-11 NOTE — Progress Notes (Addendum)
sheSubjective All started 2 weeks ago with sore throat and hoarseness, but that has been improving over the past several days. Then, 4 days ago, symptoms worsened with acute flu symptoms of myalgias, fever, chills, chest congestion and worsening hoarseness. However, the above flu symptoms have seemed to improve somewhat and fever and chills have resolved. She is still fatigued but no focal neurologic symptoms. Trying OTC Mucinex and that helps a little.  Objective: Fatigued, but not toxic . No acute distress. Mildly hoarse. TMs normal Nose, boggy terminates, slight seromucoid drainage. Pharynx, mild injection without exudate. Otherwise negative. Mildly hoarse. Occasional cough noted. Neck supple, no adenopathy Lungs: Few anterior rhonchi, otherwise clear. No wheezing. Breath sounds equal. No rales. Heart, regular rate and rhythm Skin, no rash  Assessment: Likely has bacterial bronchitis, onset 2 weeks ago, without improvement. She may have had acute influenza 4 days ago, but the fever has resolved resolved and myalgias improving. Therefore, not a candidate for Tamiflu.  Plans: Treatment options discussed, as well as risks, benefits, alternatives. Patient voiced understanding and agreement with the following plans: Z-Pak Tessalon Perles Rest, push fluids, other symptomatic care Follow-up with your primary care doctor in 5-7 days if not improving, or sooner if symptoms become worse. Precautions discussed. Red flags discussed. Questions invited and answered. Patient voiced understanding and agreement.

## 2017-07-11 NOTE — Patient Instructions (Addendum)
Work note: Excused from work Monday and Tuesday,  Return to work Wed 07/13/2017.   Acute Bronchitis, Adult Acute bronchitis is sudden (acute) swelling of the air tubes (bronchi) in the lungs. Acute bronchitis causes these tubes to fill with mucus, which can make it hard to breathe. It can also cause coughing or wheezing. In adults, acute bronchitis usually goes away within 2 weeks. A cough caused by bronchitis may last up to 3 weeks. Smoking, allergies, and asthma can make the condition worse. Repeated episodes of bronchitis may cause further lung problems, such as chronic obstructive pulmonary disease (COPD). What are the causes? This condition can be caused by germs and by substances that irritate the lungs, including:  Cold and flu viruses. This condition is most often caused by the same virus that causes a cold.  Bacteria.  Exposure to tobacco smoke, dust, fumes, and air pollution.  What increases the risk? This condition is more likely to develop in people who:  Have close contact with someone with acute bronchitis.  Are exposed to lung irritants, such as tobacco smoke, dust, fumes, and vapors.  Have a weak immune system.  Have a respiratory condition such as asthma.  What are the signs or symptoms? Symptoms of this condition include:  A cough.  Coughing up clear, yellow, or green mucus.  Wheezing.  Chest congestion.  Shortness of breath.  A fever.  Body aches.  Chills.  A sore throat.  How is this diagnosed? This condition is usually diagnosed with a physical exam. During the exam, your health care provider may order tests, such as chest X-rays, to rule out other conditions. He or she may also:  Test a sample of your mucus for bacterial infection.  Check the level of oxygen in your blood. This is done to check for pneumonia.  Do a chest X-ray or lung function testing to rule out pneumonia and other conditions.  Perform blood tests.  Your health care  provider will also ask about your symptoms and medical history. How is this treated? Most cases of acute bronchitis clear up over time without treatment. Your health care provider may recommend:  Drinking more fluids. Drinking more makes your mucus thinner, which may make it easier to breathe.  Taking a medicine for a fever or cough.  Taking an antibiotic medicine.  Using an inhaler to help improve shortness of breath and to control a cough.  Using a cool mist vaporizer or humidifier to make it easier to breathe.  Follow these instructions at home: Medicines  Take over-the-counter and prescription medicines only as told by your health care provider.  If you were prescribed an antibiotic, take it as told by your health care provider. Do not stop taking the antibiotic even if you start to feel better. General instructions  Get plenty of rest.  Drink enough fluids to keep your urine clear or pale yellow.  Avoid smoking and secondhand smoke. Exposure to cigarette smoke or irritating chemicals will make bronchitis worse. If you smoke and you need help quitting, ask your health care provider. Quitting smoking will help your lungs heal faster.  Use an inhaler, cool mist vaporizer, or humidifier as told by your health care provider.  Keep all follow-up visits as told by your health care provider. This is important. How is this prevented? To lower your risk of getting this condition again:  Wash your hands often with soap and water. If soap and water are not available, use hand sanitizer.  Avoid  contact with people who have cold symptoms.  Try not to touch your hands to your mouth, nose, or eyes.  Make sure to get the flu shot every year.  Contact a health care provider if:  Your symptoms do not improve in 2 weeks of treatment. Get help right away if:  You cough up blood.  You have chest pain.  You have severe shortness of breath.  You become dehydrated.  You faint or  keep feeling like you are going to faint.  You keep vomiting.  You have a severe headache.  Your fever or chills gets worse. This information is not intended to replace advice given to you by your health care provider. Make sure you discuss any questions you have with your health care provider. Document Released: 06/17/2004 Document Revised: 12/03/2015 Document Reviewed: 10/29/2015 Elsevier Interactive Patient Education  [employer].

## 2017-10-06 ENCOUNTER — Ambulatory Visit: Payer: Self-pay | Admitting: Family Medicine

## 2017-10-06 VITALS — BP 117/74 | HR 84 | Temp 98.3°F | Resp 18 | Ht 61.0 in | Wt 166.0 lb

## 2017-10-06 DIAGNOSIS — Z0189 Encounter for other specified special examinations: Principal | ICD-10-CM

## 2017-10-06 DIAGNOSIS — Z008 Encounter for other general examination: Secondary | ICD-10-CM

## 2017-10-06 LAB — GLUCOSE, POCT (MANUAL RESULT ENTRY): POC Glucose: 104 mg/dl — AB (ref 70–99)

## 2017-10-06 NOTE — Progress Notes (Signed)
Subjective: Annual biometrics screening  Patient presents for her annual biometric screening. Patient reports regularly using her OB/GYN Dr. Kenton Kingfisher for primary care.  Patient reports that she began eating a healthy, well-rounded diet about 1 month ago.  Previous to that she has been eating a lot of fast food and not getting regular exercise due to stress and lack of time. Patient works for Therapist, art. Patient denies any other issues or concerns.   Review of Systems Unremarkable  Objective  Physical Exam General: Awake, alert and oriented. No acute distress. Well developed, hydrated and nourished. Appears stated age.  HEENT: Supple neck without adenopathy. Sclera is non-icteric. The ear canal is clear without discharge. The tympanic membrane is normal in appearance with normal landmarks and cone of light. Nasal mucosa is pink and moist. Oral mucosa is pink and moist. The pharynx is normal in appearance without tonsillar swelling or exudates.  Skin: Skin in warm, dry and intact without rashes or lesions. Appropriate color for ethnicity. Cardiac: Heart rate and rhythm are normal. No murmurs, gallops, or rubs are auscultated.  Respiratory: The chest wall is symmetric and without deformity. No signs of respiratory distress. Lung sounds are clear in all lobes bilaterally without rales, ronchi, or wheezes.  Neurological: The patient is awake, alert and oriented to person, place, and time with normal speech.  Memory is normal and thought processes intact. No gait abnormalities are appreciated.  Psychiatric: Appropriate mood and affect.   Assessment Annual biometrics screening  Plan  Lipid panel pending. Encouraged routine visits with primary care provider.  Fasting blood sugar is 104 today.  Discussed normal values and impaired fasting glucose.  Advised her to follow-up with her primary care provider for further evaluation of this. Encouraged patient to get regular exercise and eat a healthy,  well-rounded diet.

## 2017-10-06 NOTE — Progress Notes (Signed)
040 

## 2017-10-07 LAB — LIPID PANEL
CHOL/HDL RATIO: 3.8 ratio (ref 0.0–4.4)
Cholesterol, Total: 169 mg/dL (ref 100–199)
HDL: 44 mg/dL (ref >39)
LDL CALC: 108 mg/dL — AB (ref 0–99)
Triglycerides: 84 mg/dL (ref 0–149)
VLDL Cholesterol Cal: 17 mg/dL (ref 5–40)

## 2017-10-10 NOTE — Progress Notes (Signed)
Deborah Mccarthy, Will you call the patient and inform them that their lipid panel came back?  Everything is normal, with the exception of her LDL cholesterol. The LDL cholesterol ("bad cholesterol") is elevated at 108, normal values are below 99.  Please advise the patient to follow-up with their primary care provider regarding these results.

## 2017-10-26 ENCOUNTER — Ambulatory Visit (INDEPENDENT_AMBULATORY_CARE_PROVIDER_SITE_OTHER): Payer: Managed Care, Other (non HMO) | Admitting: Primary Care

## 2017-10-26 ENCOUNTER — Encounter: Payer: Self-pay | Admitting: Primary Care

## 2017-10-26 VITALS — BP 118/74 | HR 107 | Temp 98.1°F | Ht 61.0 in | Wt 166.8 lb

## 2017-10-26 DIAGNOSIS — R739 Hyperglycemia, unspecified: Secondary | ICD-10-CM | POA: Diagnosis not present

## 2017-10-26 DIAGNOSIS — Z8349 Family history of other endocrine, nutritional and metabolic diseases: Secondary | ICD-10-CM

## 2017-10-26 DIAGNOSIS — R7303 Prediabetes: Secondary | ICD-10-CM | POA: Insufficient documentation

## 2017-10-26 LAB — COMPREHENSIVE METABOLIC PANEL
ALT: 15 U/L (ref 0–35)
AST: 15 U/L (ref 0–37)
Albumin: 4.4 g/dL (ref 3.5–5.2)
Alkaline Phosphatase: 37 U/L — ABNORMAL LOW (ref 39–117)
BILIRUBIN TOTAL: 0.4 mg/dL (ref 0.2–1.2)
BUN: 8 mg/dL (ref 6–23)
CALCIUM: 9.7 mg/dL (ref 8.4–10.5)
CO2: 29 meq/L (ref 19–32)
Chloride: 102 mEq/L (ref 96–112)
Creatinine, Ser: 0.72 mg/dL (ref 0.40–1.20)
GFR: 95.95 mL/min (ref >60.00)
Glucose, Bld: 118 mg/dL — ABNORMAL HIGH (ref 70–99)
Potassium: 3.6 mEq/L (ref 3.5–5.1)
Sodium: 138 mEq/L (ref 135–145)
Total Protein: 7 g/dL (ref 6.0–8.3)

## 2017-10-26 LAB — HEMOGLOBIN A1C: Hgb A1c MFr Bld: 5.4 % (ref 4.6–6.5)

## 2017-10-26 LAB — TSH: TSH: 1.16 u[IU]/mL (ref 0.35–4.50)

## 2017-10-26 NOTE — Progress Notes (Signed)
Subjective:    Patient ID: Deborah Mccarthy, female    DOB: Jan 30, 1981, 38 y.o.   MRN: [id number]  HPI  Deborah Mccarthy is a 37 year old female who presents today to establish care and discuss the problems mentioned below. Will review records.  Her last physical was on 10/06/17 through her occupation. She is following with OB/GYN for contraceptives and women's health. During that visit her fasting glucose was 104. She also underwent lipid panel with LDL of 108.   Today she'd like further evaluation of hyperglycemia. She'd also like her thyroid tested. She has a family history of Grave's disease in her mother. She endorses chronic fatigue since her pregnancy 3 years ago, hair thinning, dry skin, brittle nails.   2) Depression: Previously involved in therapy and managed on Wellbutrin for which she stopped in December 2018 as she didn't have a PCP. Overall she thinks she's doing well off of Wellbutrin. She stopped therapy in late 2018, she has an open line of communication with her therapist and can return at any time.  Review of Systems  Constitutional: Positive for fatigue.  Respiratory: Negative for shortness of breath.   Cardiovascular: Negative for chest pain.  Skin:       Generalized hair thinning, brittle nails.  Psychiatric/Behavioral:       Feels well managed without Wellbutrin.       Past Medical History:  Diagnosis Date  . Abnormal Pap smear of vagina   . BV (bacterial vaginosis)   . Cervical dysplasia   . Chlamydia   . Depression   . Dysmenorrhea   . Hives   . Menorrhagia   . Ovarian cyst   . Rectal ulcer    Negative for Crohn's  . Vitamin D deficiency      Social History   Socioeconomic History  . Marital status: Single    Spouse name: Not on file  . Number of children: Not on file  . Years of education: Not on file  . Highest education level: Not on file  Occupational History  . Not on file  Social Needs  . Financial resource strain: Not on file  . Food  insecurity:    Worry: Not on file    Inability: Not on file  . Transportation needs:    Medical: Not on file    Non-medical: Not on file  Tobacco Use  . Smoking status: Current Every Day Smoker    Types: Cigarettes  . Smokeless tobacco: Never Used  Substance and Sexual Activity  . Alcohol use: No    Alcohol/week: 0.0 oz  . Drug use: No  . Sexual activity: Yes    Birth control/protection: Pill  Lifestyle  . Physical activity:    Days per week: Not on file    Minutes per session: Not on file  . Stress: Not on file  Relationships  . Social connections:    Talks on phone: Not on file    Gets together: Not on file    Attends religious service: Not on file    Active member of club or organization: Not on file    Attends meetings of clubs or organizations: Not on file    Relationship status: Not on file  . Intimate partner violence:    Fear of current or ex partner: Not on file    Emotionally abused: Not on file    Physically abused: Not on file    Forced sexual activity: Not on file  Other Topics Concern  .  Not on file  Social History Narrative   Married.   2 children.   Works as a Education officer, museum.   Enjoys reading, gardening, relaxing.     Past Surgical History:  Procedure Laterality Date  . CRYOTHERAPY    . FINGER SURGERY    . PILONIDAL CYST EXCISION    . WISDOM TOOTH EXTRACTION      Family History  Problem Relation Age of Onset  . Breast cancer Maternal Grandmother 69  . Coronary artery disease Maternal Grandmother   . Colon cancer Other   . Coronary artery disease Father   . Diabetes Father   . Colon cancer Maternal Grandfather   . Graves' disease Mother     No Known Allergies  Current Outpatient Medications on File Prior to Visit  Medication Sig Dispense Refill  . norethindrone-ethinyl estradiol (JUNEL FE,GILDESS FE,LOESTRIN FE) 1-20 MG-MCG tablet Take 1 tablet by mouth daily. 1 Package 6   No current facility-administered medications on file prior to  visit.     BP 118/74   Pulse (!) 107   Temp 98.1 F (36.7 C) (Oral)   Ht 5\' 1"  (1.549 m)   Wt 166 lb 12 oz (75.6 kg)   SpO2 98%   BMI 31.51 kg/m    Objective:   Physical Exam  Constitutional: She appears well-nourished.  Neck: Neck supple.  Cardiovascular: Normal rate and regular rhythm.  Respiratory: Effort normal and breath sounds normal.  Skin: Skin is warm and dry.  Psychiatric: She has a normal mood and affect.           Assessment & Plan:

## 2017-10-26 NOTE — Assessment & Plan Note (Signed)
Present in mother.  Patient also with fatigue and hair thinning. TSH pending today.

## 2017-10-26 NOTE — Assessment & Plan Note (Signed)
Endorses a history of elevated glucose readings. A1C pending today.  Encouraged a healthy diet and regular exercise.

## 2017-10-26 NOTE — Patient Instructions (Signed)
Stop by the lab prior to leaving today. I will notify you of your results once received.   It was a pleasure to meet you today! Please don't hesitate to call or message me with any questions. Welcome to [facility]!    

## 2017-11-01 ENCOUNTER — Encounter: Payer: Self-pay | Admitting: Primary Care

## 2017-11-02 ENCOUNTER — Other Ambulatory Visit: Payer: Self-pay | Admitting: Primary Care

## 2017-11-02 DIAGNOSIS — R5383 Other fatigue: Secondary | ICD-10-CM

## 2017-11-10 ENCOUNTER — Other Ambulatory Visit (INDEPENDENT_AMBULATORY_CARE_PROVIDER_SITE_OTHER): Payer: Managed Care, Other (non HMO)

## 2017-11-10 DIAGNOSIS — R5383 Other fatigue: Secondary | ICD-10-CM

## 2017-11-10 LAB — CBC
HCT: 42 % (ref 36.0–46.0)
HEMOGLOBIN: 13.9 g/dL (ref 12.0–15.0)
MCHC: 33.1 g/dL (ref 30.0–36.0)
MCV: 91.5 fl (ref 78.0–100.0)
Platelets: 331 10*3/uL (ref 150.0–400.0)
RBC: 4.59 Mil/uL (ref 3.87–5.11)
RDW: 13.1 % (ref 11.5–15.5)
WBC: 15.6 10*3/uL — ABNORMAL HIGH (ref 4.0–10.5)

## 2017-11-10 LAB — VITAMIN D 25 HYDROXY (VIT D DEFICIENCY, FRACTURES): VITD: 30.14 ng/mL (ref 30.00–100.00)

## 2017-11-10 LAB — VITAMIN B12: Vitamin B-12: 206 pg/mL — ABNORMAL LOW (ref 211–911)

## 2017-11-11 ENCOUNTER — Encounter: Payer: Self-pay | Admitting: Primary Care

## 2017-11-11 DIAGNOSIS — F330 Major depressive disorder, recurrent, mild: Secondary | ICD-10-CM

## 2017-11-11 LAB — THYROID PEROXIDASE ANTIBODY: THYROID PEROXIDASE ANTIBODY: 1 [IU]/mL (ref <9)

## 2017-11-11 MED ORDER — BUPROPION HCL ER (XL) 150 MG PO TB24: 150.0000 mg | ORAL_TABLET | Freq: Every day | ORAL | 1 refills | Status: DC

## 2017-12-12 ENCOUNTER — Telehealth: Payer: Self-pay

## 2017-12-12 DIAGNOSIS — Z3041 Encounter for surveillance of contraceptive pills: Secondary | ICD-10-CM

## 2017-12-12 MED ORDER — NORETHIN ACE-ETH ESTRAD-FE 1-20 MG-MCG PO TABS: 1.0000 | ORAL_TABLET | Freq: Every day | ORAL | 0 refills | Status: DC

## 2017-12-12 NOTE — Telephone Encounter (Signed)
1 Refill sent. Pt aware.

## 2017-12-12 NOTE — Telephone Encounter (Signed)
Pt has scheduled AE for 01/05/18 w/[facility]. She will run out of her OCP before then. Requesting 1 refill to get to her apt. Cb#[id number]

## 2017-12-15 ENCOUNTER — Encounter: Payer: Self-pay | Admitting: Primary Care

## 2017-12-27 ENCOUNTER — Encounter: Payer: Self-pay | Admitting: Primary Care

## 2017-12-27 DIAGNOSIS — F330 Major depressive disorder, recurrent, mild: Secondary | ICD-10-CM

## 2017-12-28 MED ORDER — BUPROPION HCL ER (XL) 150 MG PO TB24: 150.0000 mg | ORAL_TABLET | Freq: Every day | ORAL | 1 refills | Status: DC

## 2018-01-05 ENCOUNTER — Ambulatory Visit (INDEPENDENT_AMBULATORY_CARE_PROVIDER_SITE_OTHER): Payer: Managed Care, Other (non HMO) | Admitting: Obstetrics & Gynecology

## 2018-01-05 ENCOUNTER — Encounter: Payer: Self-pay | Admitting: Obstetrics & Gynecology

## 2018-01-05 VITALS — BP 100/60 | Ht 61.0 in | Wt 168.0 lb

## 2018-01-05 DIAGNOSIS — Z3041 Encounter for surveillance of contraceptive pills: Secondary | ICD-10-CM

## 2018-01-05 DIAGNOSIS — Z01419 Encounter for gynecological examination (general) (routine) without abnormal findings: Secondary | ICD-10-CM | POA: Diagnosis not present

## 2018-01-05 DIAGNOSIS — Z0000 Encounter for general adult medical examination without abnormal findings: Secondary | ICD-10-CM

## 2018-01-05 DIAGNOSIS — Z23 Encounter for immunization: Secondary | ICD-10-CM

## 2018-01-05 MED ORDER — NORETHIN ACE-ETH ESTRAD-FE 1-20 MG-MCG PO TABS: 1.0000 | ORAL_TABLET | Freq: Every day | ORAL | 3 refills | Status: DC

## 2018-01-05 NOTE — Progress Notes (Signed)
HPI:      [name] is a 37 y.o. [id number] who LMP was Patient's last menstrual period was 12/25/2017., she rarely has periods on pill; and she presents today for her annual examination. The patient has no complaints today. The patient is sexually active. Her last pap: approximate date 2018 and was normal. The patient does perform self breast exams.  There is no notable family history of breast or ovarian cancer in her family.  The patient has regular exercise: yes.  The patient denies current symptoms of depression.    GYN History: Contraception: OCP (estrogen/progesterone)  PMHx: Past Medical History:  Diagnosis Date  . Abnormal Pap smear of vagina   . BV (bacterial vaginosis)   . Cervical dysplasia   . Chlamydia   . Depression   . Dysmenorrhea   . Hives   . Menorrhagia   . Ovarian cyst   . Rectal ulcer    Negative for Crohn's  . Vitamin D deficiency    Past Surgical History:  Procedure Laterality Date  . CRYOTHERAPY    . FINGER SURGERY    . PILONIDAL CYST EXCISION    . WISDOM TOOTH EXTRACTION     Family History  Problem Relation Age of Onset  . Breast cancer Maternal Grandmother 4  . Coronary artery disease Maternal Grandmother   . Colon cancer Other   . Coronary artery disease Father   . Diabetes Father   . Colon cancer Maternal Grandfather   . Graves' disease Mother    Social History   Tobacco Use  . Smoking status: Current Every Day Smoker    Types: Cigarettes  . Smokeless tobacco: Never Used  Substance Use Topics  . Alcohol use: No    Alcohol/week: 0.0 standard drinks  . Drug use: No    Current Outpatient Medications:  .  buPROPion (WELLBUTRIN XL) 150 MG 24 hr tablet, Take 1 tablet (150 mg total) by mouth daily., Disp: 90 tablet, Rfl: 1 .  norethindrone-ethinyl estradiol (JUNEL FE,GILDESS FE,LOESTRIN FE) 1-20 MG-MCG tablet, Take 1 tablet by mouth daily., Disp: 1 Package, Rfl: 0 Allergies: Patient has no known allergies.  Review of Systems    Constitutional: Negative for chills, fever and malaise/fatigue.  HENT: Negative for congestion, sinus pain and sore throat.   Eyes: Negative for blurred vision and pain.  Respiratory: Negative for cough and wheezing.   Cardiovascular: Negative for chest pain and leg swelling.  Gastrointestinal: Negative for abdominal pain, constipation, diarrhea, heartburn, nausea and vomiting.  Genitourinary: Negative for dysuria, frequency, hematuria and urgency.  Musculoskeletal: Negative for back pain, joint pain, myalgias and neck pain.  Skin: Negative for itching and rash.  Neurological: Negative for dizziness, tremors and weakness.  Endo/Heme/Allergies: Does not bruise/bleed easily.  Psychiatric/Behavioral: Negative for depression. The patient is not nervous/anxious and does not have insomnia.     Objective: BP 100/60   Ht 5\' 1"  (1.549 m)   Wt 168 lb (76.2 kg)   LMP 12/25/2017   BMI 31.74 kg/m   Filed Weights   01/05/18 0816  Weight: 168 lb (76.2 kg)   Body mass index is 31.74 kg/m. Physical Exam  Constitutional: She is oriented to person, place, and time. She appears well-developed and well-nourished. No distress.  Genitourinary: Rectum normal, vagina normal and uterus normal. Pelvic exam was performed with patient supine. There is no rash or lesion on the right labia. There is no rash or lesion on the left labia. Vagina exhibits no lesion. No bleeding  in the vagina. Right adnexum does not display mass and does not display tenderness. Left adnexum does not display mass and does not display tenderness. Cervix does not exhibit motion tenderness, lesion, friability or polyp.   Uterus is mobile and midaxial. Uterus is not enlarged or exhibiting a mass.  HENT:  Head: Normocephalic and atraumatic. Head is without laceration.  Right Ear: Hearing normal.  Left Ear: Hearing normal.  Nose: No epistaxis.  No foreign bodies.  Mouth/Throat: Uvula is midline, oropharynx is clear and moist and mucous  membranes are normal.  Eyes: Pupils are equal, round, and reactive to light.  Neck: Normal range of motion. Neck supple. No thyromegaly present.  Cardiovascular: Normal rate and regular rhythm. Exam reveals no gallop and no friction rub.  No murmur heard. Pulmonary/Chest: Effort normal and breath sounds normal. No respiratory distress. She has no wheezes. Right breast exhibits no mass, no skin change and no tenderness. Left breast exhibits no mass, no skin change and no tenderness.  Abdominal: Soft. Bowel sounds are normal. She exhibits no distension. There is no tenderness. There is no rebound.  Musculoskeletal: Normal range of motion.  Neurological: She is alert and oriented to person, place, and time. No cranial nerve deficit.  Skin: Skin is warm and dry.  Psychiatric: She has a normal mood and affect. Judgment normal.  Vitals reviewed.   Assessment:  ANNUAL EXAM 1. Annual physical exam     Screening Plan:            1.  Cervical Screening-  Pap smear schedule reviewed with patient  2. Breast screening- Exam annually and mammogram>40 planned   3. No new skin lesions  4. Labs managed by PCP  5. Counseling for contraception: oral contraceptives (estrogen/progesterone).  OCPs The risks /benefits of OCPs have been explained to the patient in detail.  Product literature has been given to her.  I have instructed her in the use of OCPs and have given her literature reinforcing this information.  I have explained to the patient that OCPs are not as effective for birth control during the first month of use, and that another form of contraception should be used during this time.  Both first-day start and Sunday start have been explained.  The risks and benefits of each was discussed.  She has been made aware of  the fact that other medications may affect the efficacy of OCPs.  I have answered all of her questions, and I believe that she has an understanding of the effectiveness and use of OCPs.   Pt counseled as to the risks of smoking and OCP use at her age.  She plans to try to quit again soon and does not want to change to any other birth control including progesterone only pills.  6. Declines flu shot    F/U  Return in about 1 year (around 01/06/2019) for Annual.  Barnett Applebaum, MD, [facility], [facility] 01/05/2018  8:39 AM

## 2019-01-26 ENCOUNTER — Other Ambulatory Visit: Payer: Self-pay

## 2019-01-26 ENCOUNTER — Telehealth: Payer: Self-pay | Admitting: Obstetrics & Gynecology

## 2019-01-26 DIAGNOSIS — Z3041 Encounter for surveillance of contraceptive pills: Secondary | ICD-10-CM

## 2019-01-26 MED ORDER — NORETHIN ACE-ETH ESTRAD-FE 1-20 MG-MCG PO TABS
1.0000 | ORAL_TABLET | Freq: Every day | ORAL | 0 refills | Status: DC
Start: 2019-01-26 — End: 2019-02-16

## 2019-01-26 NOTE — Telephone Encounter (Signed)
Done

## 2019-01-26 NOTE — Telephone Encounter (Signed)
Patient is schedule on Friday, 02/16/19 with [facility] for annual. Patient is requesting birth control refill to get to her appointment

## 2019-02-05 ENCOUNTER — Ambulatory Visit: Payer: Managed Care, Other (non HMO) | Admitting: Adult Health

## 2019-02-05 ENCOUNTER — Other Ambulatory Visit: Payer: Self-pay

## 2019-02-05 ENCOUNTER — Encounter: Payer: Self-pay | Admitting: Adult Health

## 2019-02-05 VITALS — BP 108/62 | HR 84 | Temp 97.9°F | Resp 16 | Ht 61.0 in | Wt 178.0 lb

## 2019-02-05 DIAGNOSIS — Z008 Encounter for other general examination: Secondary | ICD-10-CM | POA: Diagnosis not present

## 2019-02-05 DIAGNOSIS — Z0189 Encounter for other specified special examinations: Secondary | ICD-10-CM

## 2019-02-05 NOTE — Progress Notes (Signed)
[facility]  [facility] M[facility] DOB: 38 y.o. MRN: [id number]  Subjective:  Here for Biometric Screen/brief exam Patient is a 38 year old female in no acute distress who comes to the clinic for her biometric exam and glucose and lipids.   She works as a Education officer, museum for [employer].   She reports she has no suicidal or homicidal ideations or plans. She reports she is doing well on her Wellbutrin and has no unwanted side effects at this time.   She does smoke 1/2 pack of cigarettes daily.  Social drinker only. Denies recreational drug use.   She reports she has a primary care provider and she is due for her annual physical that she will schedule.  Patient  denies any fever, body aches,chills, rash, chest pain, shortness of breath, nausea, vomiting, or diarrhea.   She denies any other concerns at this time.   Current Outpatient Medications:  .  buPROPion (WELLBUTRIN XL) 150 MG 24 hr tablet, Take 1 tablet (150 mg total) by mouth daily., Disp: 90 tablet, Rfl: 1 .  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  Objective: Blood pressure 108/62, pulse 84, temperature 97.9 F (36.6 C), temperature source Temporal, resp. rate 16, height 5\' 1"  (1.549 m), weight 178 lb (80.7 kg), SpO2 97 %. NAD, well developed, well nourished   HEENT: Within normal limits  Neck: Normal, thyroid normal, no cervical lymphadenopathy  Heart: Regular rate and rhythm no murmurs, rubs, or gallops Lungs: Clear to auscultation without any adventitious lung sounds   Assessment: Biometric screen   ICD-10-CM   1. Encounter for other general examination- not an annual physical /  brief biometric exam with labs  Z00.8 Lipid panel    Glucose, random  2.  Encounter for biometric screening  Z01.89 Lipid panel    Glucose, random     Plan:  Fasting glucose and lipids. Discussed with patient that today's visit here is a limited biometric screening visit (not a comprehensive exam or management of any chronic problems) Discussed some health issues, including healthy eating habits and exercise. Encouraged to follow-up with PCP for annual comprehensive preventive and wellness care (and if applicable, any chronic issues). Questions invited and answered.   I will have the office call you on your glucose and cholesterol results when they return if you have not heard within 1 week please call the office.  This biometric physical is a brief physical and the only labs done are glucose and your lipid panel(cholesterol) and is  not a substitute for seeing a primary care provider for a complete annual physical. Please see a primary care physician for routine health maintenance, labs and full physical at least yearly and follow up as recommended by your provider. Provider also recommends if you do not have a primary care provider for patient to establish care as soon as possible .Patient may chose provider of choice. Also gave the [place] at [phone] or web site at [email or web address] to help assist with finding a primary care doctor.  Patient verbalizes understanding that his office is acute care only and not a substitute for a primary care or for the management of chronic conditions.    Provider also recommends patient see primary care physician for a  routine physical and to establish primary care. Patient may chose provider of choice. Also gave the [place] at [phone] or web site at [email or web address] to help assist with finding a primary care doctor. Patient understands this office is acute care office only and no primary care is done at this office.

## 2019-02-05 NOTE — Patient Instructions (Signed)
I will have the office call you on your glucose and cholesterol results when they return if you have not heard within 1 week please call the office.  This biometric physical is a brief physical and the only labs done are glucose and your lipid panel(cholesterol) and is  not a substitute for seeing a primary care provider for a complete annual physical. Please see a primary care physician for routine health maintenance, labs and full physical at least yearly and follow up as recommended by your provider. Provider also recommends if you do not have a primary care provider for patient to establish care as soon as possible .Patient may chose provider of choice. Also gave the [place] at [phone] or web site at [email or web address] to help assist with finding a primary care doctor.  Patient verbalizes understanding that his office is acute care only and not a substitute for a primary care or for the management of chronic conditions.    Health Maintenance, Female Adopting a healthy lifestyle and getting preventive care are important in promoting health and wellness. Ask your health care provider about:  The right schedule for you to have regular tests and exams.  Things you can do on your own to prevent diseases and keep yourself healthy. What should I know about diet, weight, and exercise? Eat a healthy diet   Eat a diet that includes plenty of vegetables, fruits, low-fat dairy products, and lean protein.  Do not eat a lot of foods that are high in solid fats, added sugars, or sodium. Maintain a healthy weight Body mass index (BMI) is used to identify weight problems. It estimates body fat based on height and weight. Your health care provider can help determine your BMI and help you achieve or maintain a healthy weight. Get regular exercise Get regular exercise. This is one of the most important things you can do for your health. Most adults should:   Exercise for at least 150 minutes each week. The exercise should increase your heart rate and make you sweat (moderate-intensity exercise).  Do strengthening exercises at least twice a week. This is in addition to the moderate-intensity exercise.  Spend less time sitting. Even light physical activity can be beneficial. Watch cholesterol and blood lipids Have your blood tested for lipids and cholesterol at 40 years of age, then have this test every 5 years. Have your cholesterol levels checked more often if:  Your lipid or cholesterol levels are high.  You are older than 40 years of age.  You are at high risk for heart disease. What should I know about cancer screening? Depending on your health history and family history, you may need to have cancer screening at various ages. This may include screening for:  Breast cancer.  Cervical cancer.  Colorectal cancer.  Skin cancer.  Lung cancer. What should I know about heart disease, diabetes, and high blood pressure? Blood pressure and heart disease  High blood pressure causes heart disease and increases the risk of stroke. This is more likely to develop in people who have high blood pressure readings, are of African descent, or are overweight.  Have your blood pressure checked: ? Every 3-5 years if you are 69-49 years of age. ? Every year if you are 56 years old or older. Diabetes Have regular diabetes screenings. This checks your fasting blood sugar level. Have the screening done:  Once every three years after age 71 if you are at  a normal weight and have a low risk for diabetes.  More often and at a younger age if you are overweight or have a high risk for diabetes. What should I know about preventing infection? Hepatitis B If you have a higher risk for hepatitis B, you should be screened for this virus. Talk with your health care provider to find out if you are at risk for hepatitis B infection. Hepatitis C Testing is  recommended for:  Everyone born from [birth year] through 1967.  Anyone with known risk factors for hepatitis C. Sexually transmitted infections (STIs)  Get screened for STIs, including gonorrhea and chlamydia, if: ? You are sexually active and are younger than 40 years of age. ? You are older than 40 years of age and your health care provider tells you that you are at risk for this type of infection. ? Your sexual activity has changed since you were last screened, and you are at increased risk for chlamydia or gonorrhea. Ask your health care provider if you are at risk.  Ask your health care provider about whether you are at high risk for HIV. Your health care provider may recommend a prescription medicine to help prevent HIV infection. If you choose to take medicine to prevent HIV, you should first get tested for HIV. You should then be tested every 3 months for as long as you are taking the medicine. Pregnancy  If you are about to stop having your period (premenopausal) and you may become pregnant, seek counseling before you get pregnant.  Take 400 to 800 micrograms (mcg) of folic acid every day if you become pregnant.  Ask for birth control (contraception) if you want to prevent pregnancy. Osteoporosis and menopause Osteoporosis is a disease in which the bones lose minerals and strength with aging. This can result in bone fractures. If you are 35 years old or older, or if you are at risk for osteoporosis and fractures, ask your health care provider if you should:  Be screened for bone loss.  Take a calcium or vitamin D supplement to lower your risk of fractures.  Be given hormone replacement therapy (HRT) to treat symptoms of menopause. Follow these instructions at home: Lifestyle  Do not use any products that contain nicotine or tobacco, such as cigarettes, e-cigarettes, and chewing tobacco. If you need help quitting, ask your health care provider.  Do not use street drugs.  Do not  share needles.  Ask your health care provider for help if you need support or information about quitting drugs. Alcohol use  Do not drink alcohol if: ? Your health care provider tells you not to drink. ? You are pregnant, may be pregnant, or are planning to become pregnant.  If you drink alcohol: ? Limit how much you use to 0-1 drink a day. ? Limit intake if you are breastfeeding.  Be aware of how much alcohol is in your drink. In the [place] one drink equals one 12 oz bottle of beer (355 mL), one 5 oz glass of wine (148 mL), or one 1 oz glass of hard liquor (44 mL). General instructions  Schedule regular health, dental, and eye exams.  Stay current with your vaccines.  Tell your health care provider if: ? You often feel depressed. ? You have ever been abused or do not feel safe at home. Summary  Adopting a healthy lifestyle and getting preventive care are important in promoting health and wellness.  Follow your health care provider's instructions about healthy  healthy diet, exercising, and getting tested or screened for diseases.  Follow your health care provider's instructions on monitoring your cholesterol and blood pressure. This information is not intended to replace advice given to you by your health care provider. Make sure you discuss any questions you have with your health care provider. Document Released: 11/23/2010 Document Revised: 05/03/2018 Document Reviewed: 05/03/2018 Elsevier Patient Education  2020 Elsevier Inc.  

## 2019-02-06 ENCOUNTER — Encounter: Payer: Self-pay | Admitting: Adult Health

## 2019-02-06 LAB — GLUCOSE, RANDOM: Glucose: 103 mg/dL — ABNORMAL HIGH (ref 65–99)

## 2019-02-06 LAB — LIPID PANEL
Chol/HDL Ratio: 4 ratio (ref 0.0–4.4)
Cholesterol, Total: 198 mg/dL (ref 100–199)
HDL: 49 mg/dL (ref >39)
LDL Chol Calc (NIH): 129 mg/dL — ABNORMAL HIGH (ref 0–99)
Triglycerides: 113 mg/dL (ref 0–149)
VLDL Cholesterol Cal: 20 mg/dL (ref 5–40)

## 2019-02-16 ENCOUNTER — Encounter: Payer: Self-pay | Admitting: Obstetrics & Gynecology

## 2019-02-16 ENCOUNTER — Other Ambulatory Visit: Payer: Self-pay

## 2019-02-16 ENCOUNTER — Ambulatory Visit (INDEPENDENT_AMBULATORY_CARE_PROVIDER_SITE_OTHER): Payer: Managed Care, Other (non HMO) | Admitting: Obstetrics & Gynecology

## 2019-02-16 VITALS — BP 130/90 | Ht 61.0 in | Wt 177.0 lb

## 2019-02-16 DIAGNOSIS — Z3041 Encounter for surveillance of contraceptive pills: Secondary | ICD-10-CM

## 2019-02-16 DIAGNOSIS — Z01419 Encounter for gynecological examination (general) (routine) without abnormal findings: Secondary | ICD-10-CM | POA: Diagnosis not present

## 2019-02-16 DIAGNOSIS — Z1239 Encounter for other screening for malignant neoplasm of breast: Secondary | ICD-10-CM

## 2019-02-16 DIAGNOSIS — R635 Abnormal weight gain: Secondary | ICD-10-CM

## 2019-02-16 MED ORDER — CONTRAVE 8-90 MG PO TB12: ORAL_TABLET | ORAL | 0 refills | Status: DC

## 2019-02-16 MED ORDER — NORETHIN ACE-ETH ESTRAD-FE 1-20 MG-MCG PO TABS: 1.0000 | ORAL_TABLET | Freq: Every day | ORAL | 3 refills | Status: DC

## 2019-02-16 NOTE — Progress Notes (Signed)
HPI:      [name] is a 38 y.o. [id number] who LMP was Patient's last menstrual period was 01/25/2019., she presents today for her annual examination. The patient has no complaints today. The patient is sexually active. Her last pap: approximate date 2018 and was normal. The patient does perform self breast exams.  There is notable family history of breast or ovarian cancer in her family.  The patient has regular exercise: yes.  The patient denies current symptoms of depression.    GYN History: Contraception: OCP (estrogen/progesterone)  Pt has been on this pill for years, reg cycles. She has recently re-started smoking, and desires to help quit Also concerned over recent 20 lb weight gain this year Not yet has had MMG  PMHx: Past Medical History:  Diagnosis Date  . Abnormal Pap smear of vagina   . BV (bacterial vaginosis)   . Cervical dysplasia   . Chlamydia   . Depression   . Dysmenorrhea   . Hives   . Menorrhagia   . Ovarian cyst   . Rectal ulcer    Negative for Crohn's  . Vitamin D deficiency    Past Surgical History:  Procedure Laterality Date  . CRYOTHERAPY    . FINGER SURGERY    . PILONIDAL CYST EXCISION    . WISDOM TOOTH EXTRACTION     Family History  Problem Relation Age of Onset  . Breast cancer Maternal Grandmother 92  . Coronary artery disease Maternal Grandmother   . Colon cancer Other   . Coronary artery disease Father   . Diabetes Father   . Colon cancer Maternal Grandfather   . Graves' disease Mother    Social History   Tobacco Use  . Smoking status: Current Every Day Smoker    Types: Cigarettes  . Smokeless tobacco: Never Used  Substance Use Topics  . Alcohol use: No    Alcohol/week: 0.0 standard drinks  . Drug use: No    Current Outpatient Medications:  .  norethindrone-ethinyl estradiol (LOESTRIN FE) 1-20 MG-MCG tablet, Take 1 tablet by mouth daily., Disp: 3 Package, Rfl: 3 .  Naltrexone-buPROPion HCl ER (CONTRAVE) 8-90 MG TB12,  1 tablet po once daily for 7 days, 1 tablet po bid for 7 days,  2 tablets po every morning and 1 tablet po every evening for 7 days, then 2 tablets po bid maintenance, Disp: 84 tablet, Rfl: 0 Allergies: Patient has no known allergies.  Review of Systems  Constitutional: Positive for malaise/fatigue. Negative for chills and fever.  HENT: Negative for congestion, sinus pain and sore throat.   Eyes: Negative for blurred vision and pain.  Respiratory: Negative for cough and wheezing.   Cardiovascular: Negative for chest pain and leg swelling.  Gastrointestinal: Negative for abdominal pain, constipation, diarrhea, heartburn, nausea and vomiting.  Genitourinary: Negative for dysuria, frequency, hematuria and urgency.  Musculoskeletal: Negative for back pain, joint pain, myalgias and neck pain.  Skin: Negative for itching and rash.  Neurological: Negative for dizziness, tremors and weakness.  Endo/Heme/Allergies: Does not bruise/bleed easily.  Psychiatric/Behavioral: Negative for depression. The patient is not nervous/anxious and does not have insomnia.     Objective: BP 130/90   Ht 5\' 1"  (1.549 m)   Wt 177 lb (80.3 kg)   LMP 01/25/2019   BMI 33.44 kg/m   Filed Weights   02/16/19 1429  Weight: 177 lb (80.3 kg)   Body mass index is 33.44 kg/m. Physical Exam Constitutional:  General: She is not in acute distress.    Appearance: She is well-developed.  Genitourinary:     Pelvic exam was performed with patient supine.     Vagina, uterus and rectum normal.     No lesions in the vagina.     No vaginal bleeding.     No cervical motion tenderness, friability, lesion or polyp.     Uterus is mobile.     Uterus is not enlarged.     No uterine mass detected.    Uterus is midaxial.     No right or left adnexal mass present.     Right adnexa not tender.     Left adnexa not tender.  HENT:     Head: Normocephalic and atraumatic. No laceration.     Right Ear: Hearing normal.     Left  Ear: Hearing normal.     Mouth/Throat:     Pharynx: Uvula midline.  Eyes:     Pupils: Pupils are equal, round, and reactive to light.  Neck:     Musculoskeletal: Normal range of motion and neck supple.     Thyroid: No thyromegaly.  Cardiovascular:     Rate and Rhythm: Normal rate and regular rhythm.     Heart sounds: No murmur. No friction rub. No gallop.   Pulmonary:     Effort: Pulmonary effort is normal. No respiratory distress.     Breath sounds: Normal breath sounds. No wheezing.  Chest:     Breasts:        Right: No mass, skin change or tenderness.        Left: No mass, skin change or tenderness.  Abdominal:     General: Bowel sounds are normal. There is no distension.     Palpations: Abdomen is soft.     Tenderness: There is no abdominal tenderness. There is no rebound.  Musculoskeletal: Normal range of motion.  Neurological:     Mental Status: She is alert and oriented to person, place, and time.     Cranial Nerves: No cranial nerve deficit.  Skin:    General: Skin is warm and dry.  Psychiatric:        Judgment: Judgment normal.  Vitals signs reviewed.     Assessment:  ANNUAL EXAM 1. Women's annual routine gynecological examination   2. Screening for breast cancer   3. Abnormal weight gain   4. Oral contraceptive pill surveillance     Screening Plan:            1.  Cervical Screening-  Pap smear due 2021  2. Breast screening- Exam annually and mammogram>40 planned   3. Labs managed by PCP   4. Weight gain, plan Contrave Pros and cons, SE discussed  Naltrexone-buPROPion HCl ER (CONTRAVE) 8-90 MG TB12; 1 tablet po once daily for 7 days, 1 tablet po bid for 7 days,  2 tablets po every morning and 1 tablet po every evening for 7 days, then 2 tablets po bid maintenance  Dispense: 84 tablet; Refill: 0  5. Counseling for contraception: oral contraceptives (estrogen/progesterone).  Counseled as to the risks of OCPs and tobacco use, and as she is trying to quit  again now and has been on this regimen for years and does not wish to change, will continue for now.  Consider change to progesterone only pills if unable to quit.  Risks understood by patient. - norethindrone-ethinyl estradiol (LOESTRIN FE) 1-20 MG-MCG tablet; Take 1 tablet by mouth daily.  Dispense:  3 Package; Refill: 3      F/U  Return in about 4 weeks (around 03/16/2019) for Follow up tele (weight loss).  Barnett Applebaum, MD, [facility], [facility] 02/16/2019  3:09 PM

## 2019-02-16 NOTE — Patient Instructions (Signed)
PAP every three years Mammogram every year    Call [phone] to schedule at [facility]    Bupropion; Naltrexone extended-release tablets What is this medicine? BUPROPION; NALTREXONE (byoo PROE pee on; nal TREX one) is a combination product used to promote and maintain weight loss in obese adults or overweight adults who also have weight related medical problems. This medicine should be used with a reduced calorie diet and increased physical activity. This medicine may be used for other purposes; ask your health care provider or pharmacist if you have questions. COMMON BRAND NAME(S): Contrave What should I tell my health care provider before I take this medicine? They need to know if you have any of these conditions:  an eating disorder, such as anorexia or bulimia  bipolar disorder  diabetes  depression  drug abuse or addiction  glaucoma  head injury  heart disease  high blood pressure  history of a tumor or infection of your brain or spine  history of stroke  history of irregular heartbeat  if you often drink alcohol  kidney disease  liver disease  schizophrenia  seizures  suicidal thoughts, plans, or attempt; a previous suicide attempt by you or a family member  an unusual or allergic reaction to bupropion, naltrexone, other medicines, foods, dyes, or preservatives  breast-feeding  pregnant or trying to become pregnant How should I use this medicine? Take this medicine by mouth with a glass of water. Follow the directions on the prescription label. Take this medicine in the morning and in the evenings as directed by your healthcare professional. Dennis Bast can take it with or without food. Do not take with high-fat meals as this may increase your risk of seizures. Do not crush, chew, or cut these tablets. Do not take your medicine more often than directed. Do not stop taking this medicine suddenly except upon the advice of your doctor. A special MedGuide will be  given to you by the pharmacist with each prescription and refill. Be sure to read this information carefully each time. Talk to your pediatrician regarding the use of this medicine in children. Special care may be needed. Overdosage: If you think you have taken too much of this medicine contact a poison control center or emergency room at once. NOTE: This medicine is only for you. Do not share this medicine with others. What if I miss a dose? If you miss a dose, skip the missed dose and take your next tablet at the regular time. Do not take double or extra doses. What may interact with this medicine? Do not take this medicine with any of the following medications:  any prescription or street opioid drug like codeine, heroin, methadone  linezolid  MAOIs like Carbex, Eldepryl, Marplan, Nardil, and Parnate  methylene blue (injected into a vein)  other medicines that contain bupropion like Zyban or Wellbutrin This medicine may also interact with the following medications:  alcohol  certain medicines for anxiety or sleep  certain medicines for blood pressure like metoprolol, propranolol  certain medicines for depression or psychotic disturbances  certain medicines for HIV or AIDS like efavirenz, lopinavir, nelfinavir, ritonavir  certain medicines for irregular heart beat like propafenone, flecainide  certain medicines for Parkinson's disease like amantadine, levodopa  certain medicines for seizures like carbamazepine, phenytoin, phenobarbital  cimetidine  clopidogrel  cyclophosphamide  digoxin  disulfiram  furazolidone  isoniazid  nicotine  orphenadrine  procarbazine  steroid medicines like prednisone or cortisone  stimulant medicines for attention disorders, weight loss, or  to stay awake  tamoxifen  theophylline  thioridazine  thiotepa  ticlopidine  tramadol  warfarin This list may not describe all possible interactions. Give your health care  provider a list of all the medicines, herbs, non-prescription drugs, or dietary supplements you use. Also tell them if you smoke, drink alcohol, or use illegal drugs. Some items may interact with your medicine. What should I watch for while using this medicine? This medicine is intended to be used in addition to a healthy diet and appropriate exercise. The best results are achieved this way. Do not increase or in any way change your dose without consulting your doctor or healthcare provider. Do not take this medicine with other prescription or over-the-counter weight loss products without consulting your doctor or healthcare provider. Your doctor should tell you to stop taking this medicine if you do not lose a certain amount of weight within the first 12 weeks of treatment. Visit your doctor or healthcare provider for regular checkups. Your doctor may order blood tests or other tests to see how you are doing. This medicine may cause serious skin reactions. They can happen weeks to months after starting the medicine. Contact your healthcare provider right away if you notice fevers or flu-like symptoms with a rash. The rash may be red or purple and then turn into blisters or peeling of the skin. Or, you might notice a red rash with swelling of the face, lips or lymph nodes in your neck or under your arms. This medicine may affect blood sugar levels. If you have diabetes, check with your doctor or health care professional before you change your diet or the dose of your diabetic medicine. Patients and their families should watch out for new or worsening depression or thoughts of suicide. Also watch out for sudden changes in feelings such as feeling anxious, agitated, panicky, irritable, hostile, aggressive, impulsive, severely restless, overly excited and hyperactive, or not being able to sleep. If this happens, especially at the beginning of treatment or after a change in dose, call your healthcare  provider. Avoid alcoholic drinks while taking this medicine. Drinking large amounts of alcoholic beverages, using sleeping or anxiety medicines, or quickly stopping the use of these agents while taking this medicine may increase your risk for a seizure. What side effects may I notice from receiving this medicine? Side effects that you should report to your doctor or health care professional as soon as possible:  allergic reactions like skin rash, itching or hives, swelling of the face, lips, or tongue  breathing problems  changes in vision  confusion  elevated mood, decreased need for sleep, racing thoughts, impulsive behavior  fast or irregular heartbeat  hallucinations, loss of contact with reality  increased blood pressure  rash, fever, and swollen lymph nodes  redness, blistering, peeling, or loosening of the skin, including inside the mouth  seizures  signs and symptoms of liver injury like dark yellow or brown urine; general ill feeling or flu-like symptoms; light-colored stools; loss of appetite; nausea; right upper belly pain; unusually weak or tired; yellowing of the eyes or skin  suicidal thoughts or other mood changes  vomiting Side effects that usually do not require medical attention (report to your doctor or health care professional if they continue or are bothersome):  constipation  headache  loss of appetite  indigestion, stomach upset  tremors This list may not describe all possible side effects. Call your doctor for medical advice about side effects. You may report side effects  to FDA at 1-800-FDA-1088. Where should I keep my medicine? Keep out of the reach of children. Store at room temperature between 15 and 30 degrees C (59 and 86 degrees F). Throw away any unused medicine after the expiration date. NOTE: This sheet is a summary. It may not cover all possible information. If you have questions about this medicine, talk to your doctor, pharmacist, or  health care provider.  2020 Elsevier/Gold Standard (2018-08-03 14:09:01)

## 2019-02-19 NOTE — Telephone Encounter (Signed)
I just completed the PA,

## 2019-02-27 ENCOUNTER — Encounter: Payer: Self-pay | Admitting: Obstetrics & Gynecology

## 2019-03-01 ENCOUNTER — Other Ambulatory Visit: Payer: Self-pay | Admitting: Obstetrics & Gynecology

## 2019-03-01 DIAGNOSIS — R635 Abnormal weight gain: Secondary | ICD-10-CM

## 2019-03-01 MED ORDER — BUPROPION HCL ER (XL) 300 MG PO TB24: 300.0000 mg | ORAL_TABLET | Freq: Every day | ORAL | 5 refills | Status: DC

## 2019-03-01 MED ORDER — NALTREXONE HCL 50 MG PO TABS: 50.0000 mg | ORAL_TABLET | Freq: Every day | ORAL | 5 refills | Status: DC

## 2019-03-19 ENCOUNTER — Encounter: Payer: Self-pay | Admitting: Obstetrics & Gynecology

## 2019-03-19 ENCOUNTER — Other Ambulatory Visit: Payer: Self-pay

## 2019-03-19 ENCOUNTER — Ambulatory Visit (INDEPENDENT_AMBULATORY_CARE_PROVIDER_SITE_OTHER): Payer: Managed Care, Other (non HMO) | Admitting: Obstetrics & Gynecology

## 2019-03-19 VITALS — Ht 61.0 in | Wt 166.0 lb

## 2019-03-19 DIAGNOSIS — Z6831 Body mass index (BMI) 31.0-31.9, adult: Secondary | ICD-10-CM | POA: Diagnosis not present

## 2019-03-19 DIAGNOSIS — E669 Obesity, unspecified: Secondary | ICD-10-CM | POA: Diagnosis not present

## 2019-03-19 NOTE — Progress Notes (Signed)
Virtual Visit via Telephone Note  I connected with Deborah Mccarthy on 03/19/19 at  8:20 AM EDT by telephone and verified that I am speaking with the correct person using two identifiers.   I discussed the limitations, risks, security and privacy concerns of performing an evaluation and management service by telephone and the availability of in person appointments. I also discussed with the patient that there may be a patient responsible charge related to this service. The patient expressed understanding and agreed to proceed. She was at home and I was in my office.  History of Present Illness:  Deborah Mccarthy is a 38 y.o. who was started on  Current Outpatient Medications on File Prior to Visit  Medication Sig Dispense Refill  . buPROPion (WELLBUTRIN XL) 300 MG 24 hr tablet Take 1 tablet (300 mg total) by mouth daily. 30 tablet 5  . naltrexone (DEPADE) 50 MG tablet Take 1 tablet (50 mg total) by mouth daily. 30 tablet 5  approximately 4 weeks ago due to obesity/abnormal weight gain. The patient has lost 11 pounds over the past month due to meds and lifestyle changes..   She has these side effects: none.  Also, has cut down to 1-2 cig/day; trying to quit  PMHx: She  has a past medical history of Abnormal Pap smear of vagina, BV (bacterial vaginosis), Cervical dysplasia, Chlamydia, Depression, Dysmenorrhea, Hives, Menorrhagia, Ovarian cyst, Rectal ulcer, and Vitamin D deficiency. Also,  has a past surgical history that includes Cryotherapy; Finger surgery; Pilonidal cyst excision; and Wisdom tooth extraction., family history includes Breast cancer (age of onset: 23) in her maternal grandmother; Colon cancer in her maternal grandfather and another family member; Coronary artery disease in her father and maternal grandmother; Diabetes in her father; Berenice Primas' disease in her mother.,  reports that she has been smoking cigarettes. She has never used smokeless tobacco. She reports that she does not drink  alcohol or use drugs.  She has a current medication list which includes the following prescription(s): bupropion, naltrexone, and norethindrone-ethinyl estradiol. Also, has No Known Allergies.  Review of Systems  All other systems reviewed and are negative.    Observations/Objective: No exam today, due to telephone eVisit due to [facility] virus restriction on elective visits and procedures.  Prior visits reviewed along with ultrasounds/labs as indicated. Ht 5\' 1"  (1.549 m)   Wt 166 lb (75.3 kg)   BMI 31.37 kg/m  (at home)  Assessment and Plan:   ICD-10-CM   1. Obesity (BMI 30-39.9)  E66.9   Assessment: obesity Medication treatment is going well for her.  Plan: Patient is continued/added to self-directed dieting and medicine (unable to approves contrave but utilizing individual dosing of its constituents.   Will continue to assist patient in incorporating positive experiences into her life to promote a positive mental attitude.  Education given regarding appropriate lifestyle changes for weight loss, including regular physical activity, healthy coping strategies, caloric restriction, and healthy eating patterns.  The risks and benefits as well as side effects of medication, such as Phenteramine or Tenuate, is discussed.  The pros and cons of suppressing appetite and boosting metabolism is counseled.  Risks of tolerance and addiction discussed.  Use of medicine will be short term.  Pt to call with any negative side effects and agrees to keep follow up appointments.  Also will continue to encourage smoking cessation and assist as able.  Consider change in contraception (estrogen in OCP a risk, this is discussed again today).   Follow Up Instructions:  3 mos   I discussed the assessment and treatment plan with the patient. The patient was provided an opportunity to ask questions and all were answered. The patient agreed with the plan and demonstrated an understanding of the instructions.    The patient was advised to call back or seek an in-person evaluation if the symptoms worsen or if the condition fails to improve as anticipated.  I provided 13 minutes of non-face-to-face time during this encounter.   Hoyt Koch, MD

## 2019-06-19 ENCOUNTER — Ambulatory Visit (INDEPENDENT_AMBULATORY_CARE_PROVIDER_SITE_OTHER): Payer: Managed Care, Other (non HMO) | Admitting: Obstetrics & Gynecology

## 2019-06-19 ENCOUNTER — Encounter: Payer: Self-pay | Admitting: Obstetrics & Gynecology

## 2019-06-19 ENCOUNTER — Other Ambulatory Visit: Payer: Self-pay

## 2019-06-19 VITALS — Ht 61.0 in | Wt 162.0 lb

## 2019-06-19 DIAGNOSIS — Z6830 Body mass index (BMI) 30.0-30.9, adult: Secondary | ICD-10-CM | POA: Diagnosis not present

## 2019-06-19 DIAGNOSIS — E669 Obesity, unspecified: Secondary | ICD-10-CM | POA: Diagnosis not present

## 2019-06-19 MED ORDER — NALTREXONE HCL 50 MG PO TABS: 50.0000 mg | ORAL_TABLET | Freq: Every day | ORAL | 8 refills | Status: DC

## 2019-06-19 MED ORDER — BUPROPION HCL ER (XL) 300 MG PO TB24: 300.0000 mg | ORAL_TABLET | Freq: Every day | ORAL | 8 refills | Status: DC

## 2019-06-19 NOTE — Progress Notes (Signed)
Virtual Visit via Video Note  I connected with Deborah Mccarthy on 06/19/19 at  9:00 AM EST by a video enabled telemedicine application and verified that I am speaking with the correct person using two identifiers.  Location: Patient: home Provider: office   I discussed the limitations of evaluation and management by telemedicine and the availability of in person appointments. The patient expressed understanding and agreed to proceed.  History of Present Illness: Deborah Mccarthy is a 39 y.o. who was started on  Current Outpatient Medications on File Prior to Visit  Medication Sig Dispense Refill  . buPROPion (WELLBUTRIN XL) 300 MG 24 hr tablet Take 1 tablet (300 mg total) by mouth daily. 30 tablet 5  . naltrexone (DEPADE) 50 MG tablet Take 1 tablet (50 mg total) by mouth daily. 30 tablet 5  . norethindrone-ethinyl estradiol (LOESTRIN FE) 1-20 MG-MCG tablet Take 1 tablet by mouth daily. 3 Package 3   No current facility-administered medications on file prior to visit.   approximately 6 months ago due to obesity/abnormal weight gain. The patient has lost 4 pounds over the past 3 mos due to meds and lifestyle measures..   She has these side effects: none.  She has had more stress these past few mos (father passed away unexpectedly).  She has stopped smoking.  PMHx: She  has a past medical history of Abnormal Pap smear of vagina, BV (bacterial vaginosis), Cervical dysplasia, Chlamydia, Depression, Dysmenorrhea, Hives, Menorrhagia, Ovarian cyst, Rectal ulcer, and Vitamin D deficiency. Also,  has a past surgical history that includes Cryotherapy; Finger surgery; Pilonidal cyst excision; and Wisdom tooth extraction., family history includes Breast cancer (age of onset: 68) in her maternal grandmother; Colon cancer in her maternal grandfather and another family member; Coronary artery disease in her father and maternal grandmother; Diabetes in her father; Berenice Primas' disease in her mother.,  reports that  she has been smoking cigarettes. She has never used smokeless tobacco. She reports that she does not drink alcohol or use drugs.  She has a current medication list which includes the following prescription(s): bupropion, naltrexone, and norethindrone-ethinyl estradiol. Also, has No Known Allergies.  Review of Systems  All other systems reviewed and are negative.    Observations/Objective: No exam today, due to telephone eVisit due to [facility] virus restriction on elective visits and procedures.  Prior visits reviewed along with ultrasounds/labs as indicated. From Home: Ht 5\' 1"  (1.549 m)   Wt 162 lb (73.5 kg)   BMI 30.61 kg/m  Body mass index is 30.61 kg/m.  Filed Weights   06/19/19 0857  Weight: 162 lb (73.5 kg)    Assessment and Plan:   ICD-10-CM   1. Obesity (BMI 30-39.9)  E66.9    Assessment: obesity Medication treatment is going well for her.  Plan: Patient is continued/added to prescription appetite suppressants: Wellbutrin and Naltrexone (unable to get Contrave yet responsive to these meds).   Will continue to assist patient in incorporating positive experiences into her life to promote a positive mental attitude.  Education given regarding appropriate lifestyle changes for weight loss, including regular physical activity, healthy coping strategies, caloric restriction, and healthy eating patterns.  The risks and benefits as well as side effects of medication, such as Wellbutrin and Naltrexone, is discussed.  The pros and cons of suppressing appetite and boosting metabolism is counseled. Pt to call with any negative side effects and agrees to keep follow up appointments.  Follow Up Instructions: Annual and PAP in August 2021   I discussed  the assessment and treatment plan with the patient. The patient was provided an opportunity to ask questions and all were answered. The patient agreed with the plan and demonstrated an understanding of the instructions.   The patient was  advised to call back or seek an in-person evaluation if the symptoms worsen or if the condition fails to improve as anticipated.  A total of 12 minutes were spent face-to-face with the patient as well as preparation, review, communication, and documentation during this encounter.   Barnett Applebaum, MD, [facility], [facility] 06/19/2019  9:33 AM

## 2019-06-21 NOTE — Progress Notes (Signed)
Pleas Koch, NP   Chief Complaint  Patient presents with  . STD testing    HPI:      [name] is a 39 y.o. [id number] who LMP was Patient's last menstrual period was 05/27/2019 (approximate)., presents today for STD testing. Husband may have been unfaithful--he's being treated for UTI. Pt with no known exposures. Hx of chlamydia in past. Having normal d/c, no odor/irritation. Has had urinary frequency/urgency without dysuria recently, and even urin incont last night when sleeping. Drinks a lot of caffeine. Hx of UTI sx improved with abx in past, but then had neg C&S. No hx of kidney stones.  Noticed some pelvic cramping and chills, no fever. No LBP.  On OCPs with monthly menses, lasting 3-4 days, light flow.    Patient Active Problem List   Diagnosis Date Noted  . Hyperglycemia 10/26/2017  . Family history of Graves' disease 10/26/2017  . Condyloma acuminata 01/04/2017  . Skin tag of female perineum 11/01/2016    Past Surgical History:  Procedure Laterality Date  . CRYOTHERAPY    . FINGER SURGERY    . PILONIDAL CYST EXCISION    . WISDOM TOOTH EXTRACTION      Family History  Problem Relation Age of Onset  . Breast cancer Maternal Grandmother 49  . Coronary artery disease Maternal Grandmother   . Colon cancer Other   . Coronary artery disease Father   . Diabetes Father   . Colon cancer Maternal Grandfather   . Graves' disease Mother     Social History   Socioeconomic History  . Marital status: Married    Spouse name: Not on file  . Number of children: Not on file  . Years of education: Not on file  . Highest education level: Not on file  Occupational History  . Not on file  Tobacco Use  . Smoking status: Former Smoker    Types: Cigarettes  . Smokeless tobacco: Never Used  Substance and Sexual Activity  . Alcohol use: No    Alcohol/week: 0.0 standard drinks  . Drug use: No  . Sexual activity: Yes    Birth control/protection: Pill  Other  Topics Concern  . Not on file  Social History Narrative   Married.   2 children.   Works as a Education officer, museum.   Enjoys reading, gardening, relaxing.    Social Determinants of Health   Financial Resource Strain:   . Difficulty of Paying Living Expenses: Not on file  Food Insecurity:   . Worried About Charity fundraiser in the Last Year: Not on file  . Ran Out of Food in the Last Year: Not on file  Transportation Needs:   . Lack of Transportation (Medical): Not on file  . Lack of Transportation (Non-Medical): Not on file  Physical Activity:   . Days of Exercise per Week: Not on file  . Minutes of Exercise per Session: Not on file  Stress:   . Feeling of Stress : Not on file  Social Connections:   . Frequency of Communication with Friends and Family: Not on file  . Frequency of Social Gatherings with Friends and Family: Not on file  . Attends Religious Services: Not on file  . Active Member of Clubs or Organizations: Not on file  . Attends Archivist Meetings: Not on file  . Marital Status: Not on file  Intimate Partner Violence:   . Fear of Current or Ex-Partner: Not on file  .  Emotionally Abused: Not on file  . Physically Abused: Not on file  . Sexually Abused: Not on file    Outpatient Medications Prior to Visit  Medication Sig Dispense Refill  . buPROPion (WELLBUTRIN XL) 300 MG 24 hr tablet Take 1 tablet (300 mg total) by mouth daily. 30 tablet 8  . naltrexone (DEPADE) 50 MG tablet Take 1 tablet (50 mg total) by mouth daily. 30 tablet 8  . norethindrone-ethinyl estradiol (LOESTRIN FE) 1-20 MG-MCG tablet Take 1 tablet by mouth daily. 3 Package 3   No facility-administered medications prior to visit.      ROS:  Review of Systems  Constitutional: Negative for fever.  Gastrointestinal: Negative for blood in stool, constipation, diarrhea, nausea and vomiting.  Genitourinary: Positive for frequency, urgency and vaginal discharge. Negative for dyspareunia,  dysuria, flank pain, hematuria, vaginal bleeding and vaginal pain.  Musculoskeletal: Negative for back pain.  Skin: Negative for rash.  BREAST: No symptoms   OBJECTIVE:   Vitals:  BP 120/80   Ht 5\' 1"  (1.549 m)   Wt 164 lb (74.4 kg)   LMP 05/27/2019 (Approximate)   BMI 30.99 kg/m   Physical Exam Vitals reviewed.  Constitutional:      Appearance: She is well-developed.  Pulmonary:     Effort: Pulmonary effort is normal.  Genitourinary:    General: Normal vulva.     Pubic Area: No rash.      Labia:        Right: No rash, tenderness or lesion.        Left: No rash, tenderness or lesion.      Vagina: Normal. No vaginal discharge, erythema or tenderness.     Cervix: Normal.     Uterus: Normal. Not enlarged and not tender.      Adnexa: Right adnexa normal and left adnexa normal.       Right: No mass or tenderness.         Left: No mass or tenderness.    Musculoskeletal:        General: Normal range of motion.     Cervical back: Normal range of motion.  Skin:    General: Skin is warm and dry.  Neurological:     General: No focal deficit present.     Mental Status: She is alert and oriented to person, place, and time.  Psychiatric:        Mood and Affect: Mood normal.        Behavior: Behavior normal.        Thought Content: Thought content normal.        Judgment: Judgment normal.     Results: Results for orders placed or performed in visit on 06/22/19 (from the past 24 hour(s))  POCT Urinalysis Dipstick     Status: Abnormal   Collection Time: 06/22/19 11:26 AM  Result Value Ref Range   Color, UA amber    Clarity, UA clear    Glucose, UA Negative Negative   Bilirubin, UA neg    Ketones, UA neg    Spec Grav, UA 1.020 1.010 - 1.025   Blood, UA mod    pH, UA 6.0 5.0 - 8.0   Protein, UA Negative Negative   Urobilinogen, UA     Nitrite, UA neg    Leukocytes, UA Trace (A) Negative   Appearance     Odor       Assessment/Plan: Screening for STD (sexually  transmitted disease) - Plan: Cervicovaginal ancillary only, HIV Antibody (routine testing  w rflx), RPR, HSV 2 antibody, IgG, Hepatitis C antibody; Will f/u with results.   Acute cystitis with hematuria - Plan: Urine Culture, POCT Urinalysis Dipstick, nitrofurantoin, macrocrystal-monohydrate, (MACROBID) 100 MG capsule; Pos sx and UA. Rx macrobid. F/u prn.    Meds ordered this encounter  Medications  . nitrofurantoin, macrocrystal-monohydrate, (MACROBID) 100 MG capsule    Sig: Take 1 capsule (100 mg total) by mouth 2 (two) times daily for 7 days.    Dispense:  14 capsule    Refill:  0    Order Specific Question:   Supervising Provider    Answer:   Gae Dry [id number]      Return if symptoms worsen or fail to improve.  Eliott Amparan B. Lysette Lindenbaum, PA-C 06/22/2019 11:28 AM

## 2019-06-21 NOTE — Patient Instructions (Signed)
I value your feedback and entrusting us with your care. If you get a [facility] patient survey, I would appreciate you taking the time to let us know about your experience today. Thank you!  As of May 03, 2019, your lab results will be released to your MyChart immediately, before I even have a chance to see them. Please give me time to review them and contact you if there are any abnormalities. Thank you for your patience.  

## 2019-06-22 ENCOUNTER — Other Ambulatory Visit (HOSPITAL_COMMUNITY)
Admission: RE | Admit: 2019-06-22 | Discharge: 2019-06-22 | Disposition: A | Discharge status: Home / Self Care | Payer: Managed Care, Other (non HMO) | Source: Ambulatory Visit | Attending: Obstetrics and Gynecology | Admitting: Obstetrics and Gynecology

## 2019-06-22 ENCOUNTER — Ambulatory Visit (INDEPENDENT_AMBULATORY_CARE_PROVIDER_SITE_OTHER): Payer: Managed Care, Other (non HMO) | Admitting: Obstetrics and Gynecology

## 2019-06-22 ENCOUNTER — Other Ambulatory Visit: Payer: Self-pay

## 2019-06-22 ENCOUNTER — Encounter: Payer: Self-pay | Admitting: Obstetrics and Gynecology

## 2019-06-22 VITALS — BP 120/80 | Ht 61.0 in | Wt 164.0 lb

## 2019-06-22 DIAGNOSIS — Z113 Encounter for screening for infections with a predominantly sexual mode of transmission: Secondary | ICD-10-CM | POA: Insufficient documentation

## 2019-06-22 DIAGNOSIS — N898 Other specified noninflammatory disorders of vagina: Secondary | ICD-10-CM

## 2019-06-22 DIAGNOSIS — N3001 Acute cystitis with hematuria: Secondary | ICD-10-CM | POA: Diagnosis not present

## 2019-06-22 LAB — POCT URINALYSIS DIPSTICK
Bilirubin, UA: NEGATIVE
Glucose, UA: NEGATIVE
Ketones, UA: NEGATIVE
Nitrite, UA: NEGATIVE
Protein, UA: NEGATIVE
Spec Grav, UA: 1.02 (ref 1.010–1.025)
pH, UA: 6 (ref 5.0–8.0)

## 2019-06-22 MED ORDER — NITROFURANTOIN MONOHYD MACRO 100 MG PO CAPS: 100.0000 mg | ORAL_CAPSULE | Freq: Two times a day (BID) | ORAL | 0 refills | Status: AC

## 2019-06-23 LAB — HEPATITIS C ANTIBODY: Hep C Virus Ab: <0.1 s/co ratio (ref 0.0–0.9)

## 2019-06-23 LAB — HSV 2 ANTIBODY, IGG: HSV 2 IgG, Type Spec: <0.91 index (ref 0.00–0.90)

## 2019-06-23 LAB — HIV ANTIBODY (ROUTINE TESTING W REFLEX): HIV Screen 4th Generation wRfx: NONREACTIVE

## 2019-06-23 LAB — RPR: RPR Ser Ql: NONREACTIVE

## 2019-06-24 LAB — URINE CULTURE

## 2019-06-25 ENCOUNTER — Telehealth: Payer: Self-pay | Admitting: Obstetrics and Gynecology

## 2019-06-25 LAB — CERVICOVAGINAL ANCILLARY ONLY
Chlamydia: NEGATIVE
Comment: NEGATIVE
Comment: NEGATIVE
Comment: NORMAL
Neisseria Gonorrhea: NEGATIVE
Trichomonas: NEGATIVE

## 2019-06-25 NOTE — Telephone Encounter (Signed)
Pt aware of neg STD testing. Still having urinary frequency and urgency. Has cut back on caffeine. Had neg C&S. Try AZO for bladder spasm to see if sx improve. Rechk UA (office and lab if hematuria persists) in 1 wk due to hematuria and neg C&S. Pt to sched RN appt

## 2019-08-02 ENCOUNTER — Ambulatory Visit: Payer: Managed Care, Other (non HMO)

## 2019-08-02 ENCOUNTER — Telehealth: Payer: Self-pay

## 2019-08-02 DIAGNOSIS — Z3041 Encounter for surveillance of contraceptive pills: Secondary | ICD-10-CM

## 2019-08-02 MED ORDER — NORETHIN ACE-ETH ESTRAD-FE 1-20 MG-MCG PO TABS: 1.0000 | ORAL_TABLET | Freq: Every day | ORAL | 3 refills | Status: DC

## 2019-08-02 NOTE — Telephone Encounter (Signed)
Pt aware.

## 2019-08-02 NOTE — Telephone Encounter (Signed)
Pt calling; p/u bc Friday; has lost it; all 99m of it; can't find it anywhere.  What to do?  [phone]

## 2019-09-20 ENCOUNTER — Telehealth: Payer: Self-pay

## 2019-09-20 NOTE — Telephone Encounter (Signed)
-----   Message from Gae Dry, MD sent at 09/11/2019 10:32 AM EDT ----- Regarding: MMG Received notice she has not received MMG yet as ordered at her Annual. Please check and encourage her to do this, and document conversation.

## 2019-09-20 NOTE — Telephone Encounter (Signed)
Left voice message to advise her to schedule her mammogram

## 2020-01-15 ENCOUNTER — Encounter: Payer: Self-pay | Admitting: Physician Assistant

## 2020-01-15 ENCOUNTER — Other Ambulatory Visit: Payer: Self-pay

## 2020-01-15 ENCOUNTER — Ambulatory Visit: Payer: Managed Care, Other (non HMO) | Admitting: Physician Assistant

## 2020-01-15 VITALS — BP 128/70 | HR 76 | Temp 98.0°F | Resp 15 | Ht 61.0 in | Wt 172.0 lb

## 2020-01-15 DIAGNOSIS — Z008 Encounter for other general examination: Secondary | ICD-10-CM

## 2020-01-15 DIAGNOSIS — Z0000 Encounter for general adult medical examination without abnormal findings: Secondary | ICD-10-CM | POA: Diagnosis not present

## 2020-01-15 NOTE — Progress Notes (Signed)
   Subjective:    Patient ID: Deborah Mccarthy, female    DOB: [birth year], 39 y.o.   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 Office has been very busy- Covid stressful on many levels for families  On BCPs- has breakthrough from mid-month and prolonged spotting or flow. Encouraged to snug up dosing schedule to same TIME everyday - though once bleeding pattern starts some providers will stop pack and start again.- encouraged to discuss with her GYN - sees Dr Kenton Kingfisher, [place]. UTD but needs to schedule 1st mammo  Had weight gain from 164 to 172 since January this year- 8 months. May be influential in BTB but significant in general as now BMI 32.50  Has had Covid-19 vaccinations x 2.  Review of Systems  Recently quit smoking- 4 months Otherwise non-contributory     Objective:   Physical Exam Vitals and nursing note reviewed.  Constitutional:      General: She is not in acute distress.    Appearance: Normal appearance. She is obese.  HENT:     Head: Normocephalic and atraumatic.     Right Ear: Tympanic membrane, ear canal and external ear normal.     Left Ear: Tympanic membrane, ear canal and external ear normal.     Nose: Nose normal.     Mouth/Throat:     Mouth: Mucous membranes are moist.     Comments: DDS q 6 mos    Good repair Eyes:     Extraocular Movements: Extraocular movements intact.     Conjunctiva/sclera: Conjunctivae normal.  Cardiovascular:     Rate and Rhythm: Normal rate and regular rhythm.     Pulses: Normal pulses.     Heart sounds: Normal heart sounds.  Pulmonary:     Effort: Pulmonary effort is normal.     Breath sounds: Normal breath sounds.     Comments: D/C smoke x 4 months per pt report Abdominal:     Palpations: Abdomen is soft. There is no mass.     Tenderness: There is no abdominal tenderness.  Genitourinary:    Comments: Defer to Gyn Musculoskeletal:        General: Normal  range of motion.     Cervical back: Normal range of motion and neck supple.  Skin:    General: Skin is warm and dry.     Capillary Refill: Capillary refill takes less than 2 seconds.  Neurological:     General: No focal deficit present.     Mental Status: She is alert.     Cranial Nerves: No cranial nerve deficit.     Deep Tendon Reflexes: Reflexes normal.  Psychiatric:        Mood and Affect: Mood normal.        Behavior: Behavior normal.       Assessment & Plan:  Encouraged focus on healthy food choices and portion control . 30 minute brisk walk daily-encourage accomplishing as part of work day to help make it happen. Suspect weight may be reflective of stop smoke .  Encouraged her to proceed with scheduling Screening Mammogram  Labs will be reported as available- has My Chart

## 2020-01-16 LAB — GLUCOSE, RANDOM: Glucose: 96 mg/dL (ref 65–99)

## 2020-01-16 LAB — LIPID PANEL
Chol/HDL Ratio: 4.1 ratio (ref 0.0–4.4)
Cholesterol, Total: 175 mg/dL (ref 100–199)
HDL: 43 mg/dL (ref >39)
LDL Chol Calc (NIH): 101 mg/dL — ABNORMAL HIGH (ref 0–99)
Triglycerides: 180 mg/dL — ABNORMAL HIGH (ref 0–149)
VLDL Cholesterol Cal: 31 mg/dL (ref 5–40)

## 2020-02-18 ENCOUNTER — Encounter: Payer: Self-pay | Admitting: Obstetrics & Gynecology

## 2020-02-18 ENCOUNTER — Other Ambulatory Visit (HOSPITAL_COMMUNITY)
Admission: RE | Admit: 2020-02-18 | Discharge: 2020-02-18 | Disposition: A | Discharge status: Home / Self Care | Payer: Managed Care, Other (non HMO) | Source: Ambulatory Visit | Attending: Obstetrics & Gynecology | Admitting: Obstetrics & Gynecology

## 2020-02-18 ENCOUNTER — Ambulatory Visit (INDEPENDENT_AMBULATORY_CARE_PROVIDER_SITE_OTHER): Payer: Managed Care, Other (non HMO) | Admitting: Obstetrics & Gynecology

## 2020-02-18 ENCOUNTER — Other Ambulatory Visit: Payer: Self-pay

## 2020-02-18 VITALS — BP 120/80 | Ht 61.0 in | Wt 174.0 lb

## 2020-02-18 DIAGNOSIS — E669 Obesity, unspecified: Secondary | ICD-10-CM

## 2020-02-18 DIAGNOSIS — Z3041 Encounter for surveillance of contraceptive pills: Secondary | ICD-10-CM

## 2020-02-18 DIAGNOSIS — Z01419 Encounter for gynecological examination (general) (routine) without abnormal findings: Secondary | ICD-10-CM

## 2020-02-18 DIAGNOSIS — Z1231 Encounter for screening mammogram for malignant neoplasm of breast: Secondary | ICD-10-CM

## 2020-02-18 DIAGNOSIS — Z124 Encounter for screening for malignant neoplasm of cervix: Secondary | ICD-10-CM

## 2020-02-18 MED ORDER — BUPROPION HCL ER (XL) 300 MG PO TB24: 300.0000 mg | ORAL_TABLET | Freq: Every day | ORAL | 3 refills | Status: DC

## 2020-02-18 MED ORDER — NALTREXONE HCL 50 MG PO TABS: 50.0000 mg | ORAL_TABLET | Freq: Every day | ORAL | 3 refills | Status: DC

## 2020-02-18 MED ORDER — NORETHIN ACE-ETH ESTRAD-FE 1-20 MG-MCG PO TABS: 1.0000 | ORAL_TABLET | Freq: Every day | ORAL | 3 refills | Status: DC

## 2020-02-18 NOTE — Progress Notes (Signed)
HPI:      Ms. Teriyah Purington is a 39 y.o. [id number] who LMP was Patient's last menstrual period was 02/16/2020., she presents today for her annual examination. The patient has no complaints today. The patient is sexually active. Her last pap: approximate date 2018 and was normal and last mammogram: approximate date fgew years ago and was normal. The patient does perform self breast exams.  There is notable family history of breast or ovarian cancer in her family (MGM).  The patient has regular exercise: yes.  The patient denies current symptoms of depression.    GYN History: Contraception: OCP (estrogen/progesterone)     Light periods, improved w OCPs  PMHx: Past Medical History:  Diagnosis Date  . Abnormal Pap smear of vagina   . BV (bacterial vaginosis)   . Cervical dysplasia   . Chlamydia   . Depression   . Dysmenorrhea   . Hives   . Menorrhagia   . Ovarian cyst   . Rectal ulcer    Negative for Crohn's  . Vitamin D deficiency    Past Surgical History:  Procedure Laterality Date  . CRYOTHERAPY    . FINGER SURGERY    . PILONIDAL CYST EXCISION    . WISDOM TOOTH EXTRACTION     Family History  Problem Relation Age of Onset  . Breast cancer Maternal Grandmother 54  . Coronary artery disease Maternal Grandmother   . Colon cancer Other   . Coronary artery disease Father   . Diabetes Father   . Colon cancer Maternal Grandfather   . Graves' disease Mother    Social History   Tobacco Use  . Smoking status: Former Smoker    Types: Cigarettes  . Smokeless tobacco: Never Used  Vaping Use  . Vaping Use: Never used  Substance Use Topics  . Alcohol use: No    Alcohol/week: 0.0 standard drinks  . Drug use: No    Current Outpatient Medications:  .  buPROPion (WELLBUTRIN XL) 300 MG 24 hr tablet, Take 1 tablet (300 mg total) by mouth daily., Disp: 90 tablet, Rfl: 3 .  naltrexone (DEPADE) 50 MG tablet, Take 1 tablet (50 mg total) by mouth daily., Disp: 90 tablet, Rfl: 3 .   norethindrone-ethinyl estradiol (LOESTRIN FE) 1-20 MG-MCG tablet, Take 1 tablet by mouth daily., Disp: 84 tablet, Rfl: 3 Allergies: Patient has no known allergies.  Review of Systems  Constitutional: Negative for chills, fever and malaise/fatigue.  HENT: Negative for congestion, sinus pain and sore throat.   Eyes: Negative for blurred vision and pain.  Respiratory: Negative for cough and wheezing.   Cardiovascular: Negative for chest pain and leg swelling.  Gastrointestinal: Negative for abdominal pain, constipation, diarrhea, heartburn, nausea and vomiting.  Genitourinary: Negative for dysuria, frequency, hematuria and urgency.  Musculoskeletal: Negative for back pain, joint pain, myalgias and neck pain.  Skin: Negative for itching and rash.  Neurological: Positive for headaches. Negative for dizziness, tremors and weakness.  Endo/Heme/Allergies: Does not bruise/bleed easily.  Psychiatric/Behavioral: Negative for depression. The patient is not nervous/anxious and does not have insomnia.     Objective: BP 120/80   Ht 5\' 1"  (1.549 m)   Wt 174 lb (78.9 kg)   LMP 02/16/2020   BMI 32.88 kg/m   Filed Weights   02/18/20 1436  Weight: 174 lb (78.9 kg)   Body mass index is 32.88 kg/m. Physical Exam Constitutional:      General: She is not in acute distress.    Appearance: She  is well-developed.  Genitourinary:     Pelvic exam was performed with patient supine.     Vagina, uterus and rectum normal.     No lesions in the vagina.     No vaginal bleeding.     No cervical motion tenderness, friability, lesion or polyp.     Uterus is mobile.     Uterus is not enlarged.     No uterine mass detected.    Uterus is midaxial.     No right or left adnexal mass present.     Right adnexa not tender.     Left adnexa not tender.  HENT:     Head: Normocephalic and atraumatic. No laceration.     Right Ear: Hearing normal.     Left Ear: Hearing normal.     Mouth/Throat:     Pharynx: Uvula  midline.  Eyes:     Pupils: Pupils are equal, round, and reactive to light.  Neck:     Thyroid: No thyromegaly.  Cardiovascular:     Rate and Rhythm: Normal rate and regular rhythm.     Heart sounds: No murmur heard.  No friction rub. No gallop.   Pulmonary:     Effort: Pulmonary effort is normal. No respiratory distress.     Breath sounds: Normal breath sounds. No wheezing.  Chest:     Breasts:        Right: No mass, skin change or tenderness.        Left: No mass, skin change or tenderness.  Abdominal:     General: Bowel sounds are normal. There is no distension.     Palpations: Abdomen is soft.     Tenderness: There is no abdominal tenderness. There is no rebound.  Musculoskeletal:        General: Normal range of motion.     Cervical back: Normal range of motion and neck supple.  Neurological:     Mental Status: She is alert and oriented to person, place, and time.     Cranial Nerves: No cranial nerve deficit.  Skin:    General: Skin is warm and dry.     Comments: Left neck skin lesion, persistent for months per pt.  It is slightly raised, NT.  Rec Derm.  Psychiatric:        Judgment: Judgment normal.  Vitals reviewed.     Assessment:  ANNUAL EXAM 1. Women's annual routine gynecological examination   2. Screening for cervical cancer   3. Encounter for screening mammogram for malignant neoplasm of breast   4. Oral contraceptive pill surveillance   5. Obesity (BMI 30-39.9)      Screening Plan:            1.  Cervical Screening-  Pap smear done today  2. Breast screening- Exam annually and mammogram>40 planned   3. Colonoscopy every 10 years, Hemoccult testing - after age 66  4. Labs managed by PCP  5. Counseling for contraception: oral contraceptives (estrogen/progesterone)  The pregnancy intention screening data noted above was reviewed. Potential methods of contraception were discussed. The patient elected to proceed with Oral Contraceptive.  Risks  explained of OCPs.  Especially w smoking, and pt says she is not smoking at this time (has had periods of brief relapse of smoking, then quitting again.)  - norethindrone-ethinyl estradiol (LOESTRIN FE) 1-20 MG-MCG tablet; Take 1 tablet by mouth daily.  Dispense: 84 tablet; Refill: 3   6. Obesity (BMI 30-39.9) Restart medicine regimen as has  worked in past - naltrexone (DEPADE) 50 MG tablet; Take 1 tablet (50 mg total) by mouth daily.  Dispense: 90 tablet; Refill: 3 - buPROPion (WELLBUTRIN XL) 300 MG 24 hr tablet; Take 1 tablet (300 mg total) by mouth daily.  Dispense: 90 tablet; Refill: 3   7. Rec Dermatology for neck skin lesion    F/U  Return in about 1 year (around 02/17/2021) for Annual.  Barnett Applebaum, MD, [facility], [facility] 02/18/2020  3:14 PM

## 2020-02-18 NOTE — Patient Instructions (Signed)
PAP every three years Mammogram every year    Call [phone] to schedule at [facility] yearly (with PCP)  Thank you for choosing [facility]. As part of our ongoing efforts to improve patient experience, we would appreciate your feedback. Please fill out the short survey that you will receive by mail or MyChart. Your opinion is important to us! - Dr. Nasrin Lanzo   

## 2020-02-20 LAB — CYTOLOGY - PAP
Adequacy: ABSENT
Comment: NEGATIVE
Diagnosis: NEGATIVE
High risk HPV: NEGATIVE

## 2020-06-11 ENCOUNTER — Telehealth: Payer: Self-pay

## 2020-06-11 NOTE — Telephone Encounter (Signed)
Left message to remind pt to schedule her mammogram. 

## 2020-06-11 NOTE — Telephone Encounter (Signed)
-----   Message from Gae Dry, MD sent at 05/14/2020  7:58 AM EST ----- Regarding: MMG Received notice she has not received MMG yet as ordered at her Annual. Please check and encourage her to do this, and document conversation.

## 2020-06-25 LAB — FETAL NONSTRESS TEST

## 2020-09-25 ENCOUNTER — Ambulatory Visit
Admission: RE | Admit: 2020-09-25 | Discharge: 2020-09-25 | Disposition: A | Discharge status: Home / Self Care | Payer: Managed Care, Other (non HMO) | Source: Ambulatory Visit | Attending: Obstetrics & Gynecology | Admitting: Obstetrics & Gynecology

## 2020-09-25 ENCOUNTER — Other Ambulatory Visit: Payer: Self-pay

## 2020-09-25 DIAGNOSIS — Z1231 Encounter for screening mammogram for malignant neoplasm of breast: Secondary | ICD-10-CM | POA: Insufficient documentation

## 2020-11-14 ENCOUNTER — Other Ambulatory Visit: Payer: Self-pay

## 2020-11-14 ENCOUNTER — Other Ambulatory Visit (HOSPITAL_COMMUNITY)
Admission: RE | Admit: 2020-11-14 | Discharge: 2020-11-14 | Disposition: A | Discharge status: Home / Self Care | Payer: Managed Care, Other (non HMO) | Source: Ambulatory Visit | Attending: Obstetrics and Gynecology | Admitting: Obstetrics and Gynecology

## 2020-11-14 ENCOUNTER — Encounter: Payer: Self-pay | Admitting: Obstetrics and Gynecology

## 2020-11-14 ENCOUNTER — Ambulatory Visit (INDEPENDENT_AMBULATORY_CARE_PROVIDER_SITE_OTHER): Payer: Managed Care, Other (non HMO) | Admitting: Obstetrics and Gynecology

## 2020-11-14 VITALS — BP 124/70 | Wt 185.0 lb

## 2020-11-14 DIAGNOSIS — O0990 Supervision of high risk pregnancy, unspecified, unspecified trimester: Secondary | ICD-10-CM | POA: Insufficient documentation

## 2020-11-14 DIAGNOSIS — O0991 Supervision of high risk pregnancy, unspecified, first trimester: Secondary | ICD-10-CM | POA: Diagnosis not present

## 2020-11-14 DIAGNOSIS — O09529 Supervision of elderly multigravida, unspecified trimester: Secondary | ICD-10-CM | POA: Insufficient documentation

## 2020-11-14 DIAGNOSIS — Z3A11 11 weeks gestation of pregnancy: Secondary | ICD-10-CM | POA: Diagnosis not present

## 2020-11-14 DIAGNOSIS — O99211 Obesity complicating pregnancy, first trimester: Secondary | ICD-10-CM

## 2020-11-14 DIAGNOSIS — O99210 Obesity complicating pregnancy, unspecified trimester: Secondary | ICD-10-CM | POA: Insufficient documentation

## 2020-11-14 DIAGNOSIS — Z113 Encounter for screening for infections with a predominantly sexual mode of transmission: Secondary | ICD-10-CM | POA: Insufficient documentation

## 2020-11-14 DIAGNOSIS — Z369 Encounter for antenatal screening, unspecified: Secondary | ICD-10-CM

## 2020-11-14 DIAGNOSIS — O09521 Supervision of elderly multigravida, first trimester: Secondary | ICD-10-CM | POA: Diagnosis not present

## 2020-11-14 HISTORY — DX: Supervision of elderly multigravida, unspecified trimester: O09.529

## 2020-11-14 LAB — POCT URINALYSIS DIPSTICK OB
Bilirubin, UA: NEGATIVE
Blood, UA: NEGATIVE
Glucose, UA: NEGATIVE
Ketones, UA: NEGATIVE
Nitrite, UA: NEGATIVE
POC,PROTEIN,UA: NEGATIVE
Spec Grav, UA: <=1.005 — AB (ref 1.010–1.025)
Urobilinogen, UA: 0.2 E.U./dL
pH, UA: 6 (ref 5.0–8.0)

## 2020-11-14 LAB — POCT URINE PREGNANCY: Preg Test, Ur: POSITIVE — AB

## 2020-11-14 NOTE — Progress Notes (Signed)
New Obstetric Patient H&P   Chief Complaint: "Desires prenatal care"  History of Present Illness: Patient is a 40 y.o. [id number] Not Hispanic or Latino female,unsure  LMP 08/25/2020 presents with amenorrhea and positive home pregnancy test.  She had been on a birth control pill (low dose). Sometimes her periods are light and variable. Her last pap smear was last than 1 year ago and was no abnormalities.    She had a urine pregnancy test which was positive 5 days ago.  Since her LMP she claims she has experienced no issues. She denies vaginal bleeding. Her past medical history is noncontributory. Her prior pregnancies are notable for none  Since her LMP, she admits to the use of tobacco products  former smoker as of a couple months ago.  She claims she has gained 9 pounds since the start of her pregnancy.  There are cats in the home in the home  yes If yes Indoor She admits close contact with children on a regular basis  yes  She has had chicken pox in the past yes She has had Tuberculosis exposures, symptoms, or previously tested positive for TB   no Current or past history of domestic violence. no  Genetic Screening/Teratology Counseling: (Includes patient, baby's father, or anyone in either family with:)   81. Patient's age >/= 24 [facility]  yes 2. Thalassemia ([place], [place], [place], or Asian background): MCV<80  no 3. Neural tube defect (meningomyelocele, spina bifida, anencephaly)  no 4. Congenital heart defect  no  5. Down syndrome  no 6. Tay-Sachs (Jewish, [place])  no 7. Canavan's Disease  no 8. Sickle cell disease or trait (African)  no  9. Hemophilia or other blood disorders  no  10. Muscular dystrophy  no  11. Cystic fibrosis  no  12. Huntington's Chorea  no  13. Mental retardation/autism  no 14. Other inherited genetic or chromosomal disorder  no 15. Maternal metabolic disorder (DM, PKU, etc)  no 16. Patient or FOB with a child with a birth defect not listed  above no  16a. Patient or FOB with a birth defect themselves no 17. Recurrent pregnancy loss, or stillbirth  no  18. Any medications since LMP other than prenatal vitamins (include vitamins, supplements, OTC meds, drugs, alcohol)  wellbutrin/naltrexone 19. Any other genetic/environmental exposure to discuss  no  Infection History:   1. Lives with someone with TB or TB exposed  no  2. Patient or partner has history of genital herpes  no 3. Rash or viral illness since LMP  no 4. History of STI (GC, CT, HPV, syphilis, HIV)  chlamydia and gonorrhea several years ago, treated.  5. History of recent travel :  no  Other pertinent information:  no   Review of Systems:10 point review of systems negative unless otherwise noted in HPI  Past Medical History:  Diagnosis Date   Abnormal Pap smear of vagina    BV (bacterial vaginosis)    Cervical dysplasia    Chlamydia    Depression    Dysmenorrhea    Hives    Menorrhagia    Ovarian cyst    Rectal ulcer    Negative for Crohn's   Vitamin D deficiency     Past Surgical History:  Procedure Laterality Date   CRYOTHERAPY     FINGER SURGERY     PILONIDAL CYST EXCISION     WISDOM TOOTH EXTRACTION      Gynecologic History: Patient's last menstrual period was 08/25/2020 (approximate).  Obstetric History: [id number]  Family History  Problem Relation Age of Onset   Graves' disease Mother    Coronary artery disease Father    Diabetes Father    Breast cancer Maternal Grandmother 26   Coronary artery disease Maternal Grandmother    Colon cancer Maternal Grandfather    Colon cancer Other     Social History   Socioeconomic History   Marital status: Married    Spouse name: Not on file   Number of children: Not on file   Years of education: Not on file   Highest education level: Not on file  Occupational History   Not on file  Tobacco Use   Smoking status: Former    Pack years: 0.00    Types: Cigarettes   Smokeless tobacco: Never   Vaping Use   Vaping Use: Never used  Substance and Sexual Activity   Alcohol use: No    Alcohol/week: 0.0 standard drinks   Drug use: No   Sexual activity: Yes    Birth control/protection: None  Other Topics Concern   Not on file  Social History Narrative   Married.   2 children.   Works as a Education officer, museum.   Enjoys reading, gardening, relaxing.    Social Determinants of Health   Financial Resource Strain: Not on file  Food Insecurity: Not on file  Transportation Needs: Not on file  Physical Activity: Not on file  Stress: Not on file  Social Connections: Not on file  Intimate Partner Violence: Not on file   Allergies: No Known Allergies  Prior to Admission medications: MVI   Physical Exam BP 124/70   Wt 185 lb (83.9 kg)   LMP 08/25/2020 (Approximate)   BMI 34.96 kg/m   Physical Exam Constitutional:      General: She is not in acute distress.    Appearance: Normal appearance. She is well-developed.  Genitourinary:     Vulva, bladder and urethral meatus normal.     Right Labia: No rash, tenderness, lesions, skin changes or Bartholin's cyst.    Left Labia: No tenderness, lesions, skin changes, Bartholin's cyst or rash.    No inguinal adenopathy present in the right or left side.    Pelvic Tanner Score: 5/5.     Right Adnexa: not tender, not full and no mass present.    Left Adnexa: not tender, not full and no mass present.    No cervical motion tenderness, friability, lesion or polyp.     Uterus is enlarged (slightly).     Uterus is not fixed or tender.     Uterus is anteverted.     No urethral tenderness or mass present.     Pelvic exam was performed with patient in the lithotomy position.  HENT:     Head: Normocephalic and atraumatic.  Eyes:     General: No scleral icterus.    Conjunctiva/sclera: Conjunctivae normal.  Cardiovascular:     Rate and Rhythm: Normal rate and regular rhythm.     Heart sounds: No murmur heard.   No friction rub. No gallop.   Pulmonary:     Effort: Pulmonary effort is normal. No respiratory distress.     Breath sounds: Normal breath sounds. No wheezing or rales.  Abdominal:     General: Bowel sounds are normal. There is no distension.     Palpations: Abdomen is soft. There is no mass.     Tenderness: There is no abdominal tenderness. There is no guarding or rebound.  Hernia: There is no hernia in the left inguinal area or right inguinal area.  Musculoskeletal:        General: Normal range of motion.     Cervical back: Normal range of motion and neck supple.  Lymphadenopathy:     Lower Body: No right inguinal adenopathy. No left inguinal adenopathy.  Neurological:     General: No focal deficit present.     Mental Status: She is alert and oriented to person, place, and time.     Cranial Nerves: No cranial nerve deficit.  Skin:    General: Skin is warm and dry.     Findings: No erythema.  Psychiatric:        Mood and Affect: Mood normal.        Behavior: Behavior normal.        Judgment: Judgment normal.   Female Chaperone present during breast and/or pelvic exam.  Urine pregnancy test: positive   Assessment: 40 y.o. [id number] at [redacted]w[redacted]d presenting to initiate prenatal care  Plan: 1) Avoid alcoholic beverages. 2) Patient encouraged not to smoke.  3) Discontinue the use of all non-medicinal drugs and chemicals.  4) Take prenatal vitamins daily.  5) Nutrition, food safety (fish, cheese advisories, and high nitrite foods) and exercise discussed. 6) Hospital and practice style discussed with cross coverage system.  7) Genetic Screening, such as with 1st Trimester Screening, cell free fetal DNA, AFP testing, and Ultrasound, as well as with amniocentesis and CVS as appropriate, is discussed with patient. At the conclusion of today's visit patient requested genetic testing 8) Patient is asked about travel to areas at risk for the Zika virus, and counseled to avoid travel and exposure to mosquitoes or sexual  partners who may have themselves been exposed to the virus. Testing is discussed, and will be ordered as appropriate.  9) Ultrasound to assess gestational age and EDD.  Labs then.   Prentice Docker, MD 11/14/2020 10:05 AM

## 2020-11-16 LAB — URINE CULTURE

## 2020-11-18 LAB — CERVICOVAGINAL ANCILLARY ONLY
Chlamydia: NEGATIVE
Comment: NEGATIVE
Comment: NEGATIVE
Comment: NORMAL
Neisseria Gonorrhea: NEGATIVE
Trichomonas: NEGATIVE

## 2020-11-28 ENCOUNTER — Encounter: Payer: Self-pay | Admitting: Obstetrics and Gynecology

## 2020-11-28 ENCOUNTER — Ambulatory Visit (INDEPENDENT_AMBULATORY_CARE_PROVIDER_SITE_OTHER): Payer: Managed Care, Other (non HMO) | Admitting: Obstetrics and Gynecology

## 2020-11-28 ENCOUNTER — Other Ambulatory Visit: Payer: Self-pay

## 2020-11-28 VITALS — BP 120/68 | Wt 192.0 lb

## 2020-11-28 DIAGNOSIS — Z3A01 Less than 8 weeks gestation of pregnancy: Secondary | ICD-10-CM | POA: Diagnosis not present

## 2020-11-28 DIAGNOSIS — O0991 Supervision of high risk pregnancy, unspecified, first trimester: Secondary | ICD-10-CM | POA: Diagnosis not present

## 2020-11-28 DIAGNOSIS — O09521 Supervision of elderly multigravida, first trimester: Secondary | ICD-10-CM | POA: Diagnosis not present

## 2020-11-28 DIAGNOSIS — O99211 Obesity complicating pregnancy, first trimester: Secondary | ICD-10-CM | POA: Diagnosis not present

## 2020-11-28 LAB — POCT URINALYSIS DIPSTICK OB
Glucose, UA: NEGATIVE
POC,PROTEIN,UA: NEGATIVE

## 2020-11-28 NOTE — Progress Notes (Signed)
ULTRASOUND REPORT  Location: [facility] Date of Service: 11/28/2020   Indications:dating (transvaginal) Findings:  Deborah Mccarthy intrauterine pregnancy is visualized with a CRL consistent with [redacted]w[redacted]d gestation, giving an (U/S) EDD of 07/11/2021. The (U/S) EDD is not consistent with the clinically established EDD of 06/01/2021.  FHR: 154 BPM CRL measurement: 14.8 mm Yolk sac is visualized and appears normal. Amnion: visualized and appears normal   Right Ovary is not visualized. Left Ovary is normal appearance. Corpus luteal cyst:  Left ovary Survey of the adnexa demonstrates no adnexal masses. There is no free peritoneal fluid in the cul de sac.  Female chaperone present for vaginal ultrasound:   Impression: 1. [redacted]w[redacted]d Viable Singleton Intrauterine pregnancy by U/S. 2. (U/S) EDD is consistent with Clinically established EDD of 07/11/2021.  There is a viable singleton gestation.  Detailed evaluation of the fetal anatomy is precluded by early gestational age.  It must be noted that a normal ultrasound particular at this early gestational age is unable to rule out fetal aneuploidy, risk of first trimester miscarriage, or anatomic birth defects.  I personally performed the ultrasound and interpreted the images.  Prentice Docker, MD, [facility], [facility] 11/28/2020 4:11 PM

## 2020-11-28 NOTE — Progress Notes (Signed)
  Routine Prenatal Care Visit  Subjective  Deborah Mccarthy is a 40 y.o. [id number] at [redacted]w[redacted]d being seen today for ongoing prenatal care.  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  ----------------------------------------------------------------------------------- Patient reports no complaints.    . Vag. Bleeding: None.   . Leaking Fluid denies.  Dating u/s changed EDD to 07/11/2021. See report under NOTES ----------------------------------------------------------------------------------- The following portions of the patient's history were reviewed and updated as appropriate: allergies, current medications, past family history, past medical history, past social history, past surgical history and problem list. Problem list updated.  Objective  Blood pressure 120/68, weight 192 lb (87.1 kg), last menstrual period 08/25/2020. Pregravid weight 178 lb (80.7 kg) Total Weight Gain 14 lb (6.35 kg) Urinalysis: Urine Protein Negative  Urine Glucose Negative  Fetal Status: Fetal Heart Rate (bpm): 154 ([place])         General:  Alert, oriented and cooperative. Patient is in no acute distress.  Skin: Skin is warm and dry. No rash noted.   Cardiovascular: Normal heart rate noted  Respiratory: Normal respiratory effort, no problems with respiration noted  Abdomen: Soft, gravid, appropriate for gestational age.       Pelvic:  Cervical exam deferred        Extremities: Normal range of motion.     Mental Status: Normal mood and affect. Normal behavior. Normal judgment and thought content.   Assessment   40 y.o. [id number] at [redacted]w[redacted]d by  07/11/2021, by Ultrasound presenting for routine prenatal visit  Plan   FIFTH Problems (from  11/14/20 to present)     Problem Noted Resolved   Supervision of high risk pregnancy, antepartum 11/14/2020 by Will Bonnet, MD No   Advanced maternal age in multigravida 11/14/2020 by Will Bonnet, MD No   Obesity affecting pregnancy 11/14/2020 by Will Bonnet, MD No        Preterm labor symptoms and general obstetric precautions including but not limited to vaginal bleeding, contractions, leaking of fluid and fetal movement were reviewed in detail with the patient. Please refer to After Visit Summary for other counseling recommendations.   - NOB labs/ 1h gtt/ NIPT testing next visit or in > 2 weeks.  Return in about 4 weeks (around 12/26/2020) for Routine Prenatal Appointment/labs.   Prentice Docker, MD, [facility], [facility] 11/28/2020 4:08 PM

## 2020-12-15 ENCOUNTER — Other Ambulatory Visit: Payer: Self-pay

## 2020-12-15 ENCOUNTER — Other Ambulatory Visit: Payer: Managed Care, Other (non HMO)

## 2020-12-15 DIAGNOSIS — Z113 Encounter for screening for infections with a predominantly sexual mode of transmission: Secondary | ICD-10-CM

## 2020-12-15 DIAGNOSIS — O99211 Obesity complicating pregnancy, first trimester: Secondary | ICD-10-CM

## 2020-12-15 DIAGNOSIS — O0991 Supervision of high risk pregnancy, unspecified, first trimester: Secondary | ICD-10-CM

## 2020-12-15 DIAGNOSIS — O09521 Supervision of elderly multigravida, first trimester: Secondary | ICD-10-CM

## 2020-12-16 ENCOUNTER — Other Ambulatory Visit: Payer: Self-pay | Admitting: Obstetrics and Gynecology

## 2020-12-16 DIAGNOSIS — Z1379 Encounter for other screening for genetic and chromosomal anomalies: Secondary | ICD-10-CM

## 2020-12-17 ENCOUNTER — Other Ambulatory Visit: Payer: Self-pay | Admitting: Obstetrics and Gynecology

## 2020-12-17 ENCOUNTER — Other Ambulatory Visit: Payer: Managed Care, Other (non HMO)

## 2020-12-17 ENCOUNTER — Other Ambulatory Visit: Payer: Self-pay

## 2020-12-17 DIAGNOSIS — Z1379 Encounter for other screening for genetic and chromosomal anomalies: Secondary | ICD-10-CM

## 2020-12-17 DIAGNOSIS — O99211 Obesity complicating pregnancy, first trimester: Secondary | ICD-10-CM

## 2020-12-17 DIAGNOSIS — O0991 Supervision of high risk pregnancy, unspecified, first trimester: Secondary | ICD-10-CM

## 2020-12-18 LAB — RPR+RH+ABO+RUB AB+AB SCR+CB...
Antibody Screen: NEGATIVE
HIV Screen 4th Generation wRfx: NONREACTIVE
Hematocrit: 35.5 % (ref 34.0–46.6)
Hemoglobin: 11.5 g/dL (ref 11.1–15.9)
Hepatitis B Surface Ag: NEGATIVE
MCH: 29.3 pg (ref 26.6–33.0)
MCHC: 32.4 g/dL (ref 31.5–35.7)
MCV: 90 fL (ref 79–97)
Platelets: 338 10*3/uL (ref 150–450)
RBC: 3.93 x10E6/uL (ref 3.77–5.28)
RDW: 13.3 % (ref 11.7–15.4)
RPR Ser Ql: NONREACTIVE
Rh Factor: POSITIVE
Rubella Antibodies, IGG: 5.32 index (ref >0.99)
Varicella zoster IgG: 3187 index (ref >165)
WBC: 9.9 10*3/uL (ref 3.4–10.8)

## 2020-12-18 LAB — GLUCOSE TOLERANCE, 1 HOUR: Glucose, 1Hr PP: 119 mg/dL (ref 65–199)

## 2020-12-22 LAB — MATERNIT21 PLUS CORE+SCA
Fetal Fraction: 12
Monosomy X (Turner Syndrome): NOT DETECTED
Result (T21): NEGATIVE
Trisomy 13 (Patau syndrome): NEGATIVE
Trisomy 18 (Edwards syndrome): NEGATIVE
Trisomy 21 (Down syndrome): NEGATIVE
XXX (Triple X Syndrome): NOT DETECTED
XXY (Klinefelter Syndrome): NOT DETECTED
XYY (Jacobs Syndrome): NOT DETECTED

## 2020-12-29 ENCOUNTER — Other Ambulatory Visit: Payer: Self-pay

## 2020-12-29 ENCOUNTER — Other Ambulatory Visit: Payer: Self-pay | Admitting: Obstetrics

## 2020-12-29 ENCOUNTER — Ambulatory Visit (INDEPENDENT_AMBULATORY_CARE_PROVIDER_SITE_OTHER): Payer: Managed Care, Other (non HMO) | Admitting: Obstetrics

## 2020-12-29 ENCOUNTER — Ambulatory Visit: Payer: Managed Care, Other (non HMO) | Admitting: Obstetrics

## 2020-12-29 VITALS — BP 130/76 | Wt 195.0 lb

## 2020-12-29 DIAGNOSIS — Z3A12 12 weeks gestation of pregnancy: Secondary | ICD-10-CM

## 2020-12-29 DIAGNOSIS — O0990 Supervision of high risk pregnancy, unspecified, unspecified trimester: Secondary | ICD-10-CM

## 2020-12-29 DIAGNOSIS — O0991 Supervision of high risk pregnancy, unspecified, first trimester: Secondary | ICD-10-CM

## 2020-12-29 LAB — POCT URINALYSIS DIPSTICK OB
Glucose, UA: NEGATIVE
POC,PROTEIN,UA: NEGATIVE

## 2020-12-29 NOTE — Progress Notes (Signed)
  Routine Prenatal Care Visit  Subjective  Deborah Mccarthy is a 40 y.o. [id number] at 58w2dbeing seen today for ongoing prenatal care.  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  ----------------------------------------------------------------------------------- Patient reports no complaints.    .  .   .Deborah ShellFluid denies.  ----------------------------------------------------------------------------------- The following portions of the patient's history were reviewed and updated as appropriate: allergies, current medications, past family history, past medical history, past social history, past surgical history and problem list. Problem list updated.  Objective  Last menstrual period 08/25/2020. Pregravid weight 178 lb (80.7 kg) Total Weight Gain 17 lb (7.711 kg) Urinalysis: Urine Protein    Urine Glucose    Fetal Status:           General:  Alert, oriented and cooperative. Patient is in no acute distress.  Skin: Skin is warm and dry. No rash noted.   Cardiovascular: Normal heart rate noted  Respiratory: Normal respiratory effort, no problems with respiration noted  Abdomen: Soft, gravid, appropriate for gestational age.       Pelvic:  Cervical exam deferred        Extremities: Normal range of motion.     Mental Status: Normal mood and affect. Normal behavior. Normal judgment and thought content.   Assessment   40y.o. [id number] 173w2dy  07/11/2021, by Ultrasound presenting for routine prenatal visit  Plan   FIFTH Problems (from 11/14/20 to present)    Problem Noted Resolved   Supervision of high risk pregnancy, antepartum 11/14/2020 by JaWill BonnetMD No   Advanced maternal age in  multigravida 11/14/2020 by JaWill BonnetMD No   Obesity affecting pregnancy 11/14/2020 by JaWill BonnetMD No       Preterm labor symptoms and general obstetric precautions including but not limited to vaginal bleeding, contractions, leaking of fluid and fetal movement were reviewed in detail with the patient. Please refer to After Visit Summary for other counseling recommendations.  Reveiwed her early 1hr GTT results. Anatomy scan is ordered for 6 weeks from now with the MFM (AMA, High BMI)Encouraged her to walk 5 x weekly, keep wt gain to minimum. Her anatomy scan will be with MFM. She has not named her baby yet.  Return in about 4 weeks (around 01/26/2021) for return OB.  Deborah RichesCNM  12/29/2020 8:27 AM

## 2020-12-29 NOTE — Progress Notes (Unsigned)
ROB- no concerns 

## 2021-01-28 ENCOUNTER — Other Ambulatory Visit: Payer: Self-pay | Admitting: Obstetrics

## 2021-01-28 DIAGNOSIS — O0990 Supervision of high risk pregnancy, unspecified, unspecified trimester: Secondary | ICD-10-CM

## 2021-01-28 DIAGNOSIS — O99211 Obesity complicating pregnancy, first trimester: Secondary | ICD-10-CM

## 2021-01-28 NOTE — Progress Notes (Signed)
[id number]  

## 2021-01-29 ENCOUNTER — Encounter: Payer: Managed Care, Other (non HMO) | Admitting: Obstetrics

## 2021-01-29 ENCOUNTER — Telehealth: Payer: Self-pay

## 2021-01-29 NOTE — Telephone Encounter (Signed)
Could you please look into this for this patient?

## 2021-01-29 NOTE — Telephone Encounter (Signed)
Patient was scheduled today for office visit. She showed up late (15 minutes)for her appointment . So we had to reschedule her to Monday, 9/12 with Opal Sidles. Upon rescheduling patient states she was on the phone with insurance about her upcoming scheduled ultrasound. Patient would like a call to go over her options for a regular ultrasound instead of the one ordered due to insurance reason. Please advise

## 2021-01-29 NOTE — Telephone Encounter (Signed)
She has already reached out about this and I have no control about the insurance coverage. She is Advanced Maternal Age. Please check with Deborah Mccarthy as to who can help with this.

## 2021-02-02 ENCOUNTER — Ambulatory Visit (INDEPENDENT_AMBULATORY_CARE_PROVIDER_SITE_OTHER): Payer: Managed Care, Other (non HMO) | Admitting: Advanced Practice Midwife

## 2021-02-02 ENCOUNTER — Encounter: Payer: Self-pay | Admitting: Advanced Practice Midwife

## 2021-02-02 ENCOUNTER — Other Ambulatory Visit: Payer: Self-pay

## 2021-02-02 VITALS — BP 128/70 | Wt 197.0 lb

## 2021-02-02 DIAGNOSIS — O0992 Supervision of high risk pregnancy, unspecified, second trimester: Secondary | ICD-10-CM

## 2021-02-02 DIAGNOSIS — Z23 Encounter for immunization: Secondary | ICD-10-CM

## 2021-02-02 DIAGNOSIS — O09522 Supervision of elderly multigravida, second trimester: Secondary | ICD-10-CM

## 2021-02-02 DIAGNOSIS — Z3A17 17 weeks gestation of pregnancy: Secondary | ICD-10-CM

## 2021-02-02 LAB — POCT URINALYSIS DIPSTICK OB: Glucose, UA: NEGATIVE

## 2021-02-02 NOTE — Progress Notes (Signed)
  Routine Prenatal Care Visit  Subjective  Deborah Mccarthy is a 40 y.o. [id number] at 47w2dbeing seen today for ongoing prenatal care.  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  ----------------------------------------------------------------------------------- Patient reports no complaints.    . Vag. Bleeding: None.  Movement: Present. Leaking Fluid denies.  ----------------------------------------------------------------------------------- The following portions of the patient's history were reviewed and updated as appropriate: allergies, current medications, past family history, past medical history, past social history, past surgical history and problem list. Problem list updated.  Objective  Blood pressure 128/70, weight 197 lb (89.4 kg), last menstrual period 08/25/2020. Pregravid weight 178 lb (80.7 kg) Total Weight Gain 19 lb (8.618 kg) Urinalysis: Urine Protein Trace  Urine Glucose Negative  Fetal Status: Fetal Heart Rate (bpm): 146   Movement: Present     General:  Alert, oriented and cooperative. Patient is in no acute distress.  Skin: Skin is warm and dry. No rash noted.   Cardiovascular: Normal heart rate noted  Respiratory: Normal respiratory effort, no problems with respiration noted  Abdomen: Soft, gravid, appropriate for gestational age. Pain/Pressure: Absent     Pelvic:  Cervical exam deferred        Extremities: Normal range of motion.  Edema: None  Mental Status: Normal mood and affect. Normal behavior. Normal judgment and thought content.   Assessment   40y.o. [id number] 186w2dy  07/11/2021, by Ultrasound presenting for routine prenatal visit  Plan   FIFTH Problems (from 11/14/20  to present)    Problem Noted Resolved   Supervision of high risk pregnancy, antepartum 11/14/2020 by JaWill BonnetMD No   Overview Addendum 02/02/2021  3:12 PM by [name]     Nursing Staff Provider  Office Location  [facility] Dating    Language  English Anatomy [place]  Flu Vaccine  02/02/21 Genetic Screen  NIPS:   TDaP vaccine    Hgb A1C or  GTT Early : Third trimester :   Covid    LAB RESULTS   Rhogam   Blood Type A/Positive/-- (07/27 1009)   Feeding Plan  Antibody Negative (07/27 1009)  Contraception  Rubella 5.32 (07/27 1009)  Circumcision  RPR Non Reactive (07/27 1009)   Pediatrician   HBsAg Negative (07/27 1009)   Support Person  HIV Non Reactive (07/27 1009)  Prenatal Classes  Varicella     GBS  (For PCN allergy, check sensitivities)   BTL Consent     VBAC Consent  Pap      Hgb Electro      CF      SMA               Advanced maternal age in multigravida 11/14/2020 by JaWill BonnetMD No   Obesity affecting pregnancy 11/14/2020 by JaWill BonnetMD No       Preterm labor symptoms and general obstetric precautions including but not limited to vaginal bleeding, contractions, leaking of fluid and fetal movement were reviewed in detail with the patient. Please refer to After Visit Summary for other counseling recommendations.   Return for scheduled visits.  JaRod CanCNM 02/02/2021 3:33 PM

## 2021-02-04 ENCOUNTER — Other Ambulatory Visit: Payer: Self-pay | Admitting: *Deleted

## 2021-02-09 NOTE — Telephone Encounter (Signed)
Spoke to the patient, who understands her ultrasounds should be done at [facility], but was not sure why her insurance would not cover the cost. She did not believe this was due to her deductible, but rather the policy would not cover. She mentioned they had trouble finding the location, she did not know the exact ultrasound, and said they would not cover 3D/4D scans. I confirmed the location and gave her the CPT and dx codes, and she will call again today. Patient will call or message if she needs further assistance.

## 2021-02-18 ENCOUNTER — Ambulatory Visit: Payer: Managed Care, Other (non HMO) | Attending: Obstetrics

## 2021-02-18 ENCOUNTER — Encounter: Payer: Self-pay | Admitting: *Deleted

## 2021-02-18 ENCOUNTER — Other Ambulatory Visit: Payer: Self-pay

## 2021-02-18 ENCOUNTER — Ambulatory Visit: Payer: Managed Care, Other (non HMO) | Admitting: *Deleted

## 2021-02-18 VITALS — BP 113/63 | HR 79

## 2021-02-18 DIAGNOSIS — O99212 Obesity complicating pregnancy, second trimester: Secondary | ICD-10-CM | POA: Insufficient documentation

## 2021-02-18 DIAGNOSIS — O0990 Supervision of high risk pregnancy, unspecified, unspecified trimester: Secondary | ICD-10-CM | POA: Diagnosis present

## 2021-02-18 DIAGNOSIS — O09522 Supervision of elderly multigravida, second trimester: Secondary | ICD-10-CM | POA: Insufficient documentation

## 2021-02-19 ENCOUNTER — Encounter: Payer: Managed Care, Other (non HMO) | Admitting: Obstetrics & Gynecology

## 2021-02-19 ENCOUNTER — Ambulatory Visit (INDEPENDENT_AMBULATORY_CARE_PROVIDER_SITE_OTHER): Payer: Managed Care, Other (non HMO) | Admitting: Obstetrics & Gynecology

## 2021-02-19 ENCOUNTER — Encounter: Payer: Self-pay | Admitting: Obstetrics & Gynecology

## 2021-02-19 ENCOUNTER — Other Ambulatory Visit: Payer: Self-pay | Admitting: *Deleted

## 2021-02-19 VITALS — BP 120/80 | Wt 200.0 lb

## 2021-02-19 DIAGNOSIS — O0992 Supervision of high risk pregnancy, unspecified, second trimester: Secondary | ICD-10-CM

## 2021-02-19 DIAGNOSIS — Z131 Encounter for screening for diabetes mellitus: Secondary | ICD-10-CM

## 2021-02-19 DIAGNOSIS — O4402 Placenta previa specified as without hemorrhage, second trimester: Secondary | ICD-10-CM

## 2021-02-19 DIAGNOSIS — Z6833 Body mass index (BMI) 33.0-33.9, adult: Secondary | ICD-10-CM

## 2021-02-19 DIAGNOSIS — O09522 Supervision of elderly multigravida, second trimester: Secondary | ICD-10-CM

## 2021-02-19 DIAGNOSIS — Z362 Encounter for other antenatal screening follow-up: Secondary | ICD-10-CM

## 2021-02-19 DIAGNOSIS — O0990 Supervision of high risk pregnancy, unspecified, unspecified trimester: Secondary | ICD-10-CM

## 2021-02-19 DIAGNOSIS — Z3A19 19 weeks gestation of pregnancy: Secondary | ICD-10-CM

## 2021-02-19 NOTE — Patient Instructions (Addendum)
Surgery to Prevent Pregnancy Sterilization is surgery to prevent pregnancy. Sterilization is permanent. It should only be done if you are sure that you do not want to have children. For females, the fallopian tubes are either blocked or closed off. When the fallopian tubes are closed, the eggs that the ovaries release cannot enter the uterus, sperm cannot reach the eggs, and pregnancy is prevented. For males, the vas deferens is cut and then tied or burned (cauterized). The vas deferens is a tube that carries sperm from the testicles. This procedure prevents pregnancy by blocking sperm from going through the vas deferens and penis during ejaculation. Types of sterilization   For females, the surgeries include: Laparoscopic tubal ligation. In this surgery, the fallopian tubes are tied off, sealed with heat, or blocked with a clip, ring, or clamp. A small portion of each fallopian tube may also be removed. This surgery is done through several small cuts (incisions) with special instruments that are inserted into the abdomen. Postpartum tubal ligation. This is also called a mini-laparotomy. This surgery is done right after childbirth or 1 or 2 days after childbirth. In this surgery, the fallopian tubes are tied off, sealed with heat, or blocked with a clip, ring, or clamp. A small portion of each fallopian tube may also be removed. The surgery is done through a single incision in the abdomen. Tubal ligation during a C-section. In this surgery, the fallopian tubes are tied off, sealed with heat, or blocked with a clip, ring, or clamp. A small portion of each fallopian tube may also be removed. The surgery is done at the same time as a C-section delivery. For males, the surgeries include: Incision vasectomy. In this surgery, one or two small incisions are made in the scrotum. The vas deferens will be pulled out of the scrotum and cut. The vas deferens will be tied off or sealed with heat and placed back into  your scrotum. The incision will be closed with absorbable stitches (sutures). No scalpel vasectomy. In this surgery, a punctured opening is made in the scrotum. The vas deferens will be pulled out of the scrotum and cut. The vas deferens will be tied off or sealed with heat and placed back into your scrotum. The opening is small and will not require sutures. What are the benefits of sterilization? It is usually effective for a lifetime. The procedures are generally safe. For females, sterilization does not affect the hormones, like other types of birth control. Because of this, menstrual periods will not be affected. For both males and females, sexual desire and sexual performance will not be affected. What are the disadvantages of sterilization? Risks from the surgery Generally, sterilization is safe. Complications are rare. However, there are some risks. They include: Bleeding. Infection. Reaction to medicine used during the procedure. Injury to surrounding organs. Risks after sterilization After a successful surgery, you may have other problems. Female sterilization risks may include: Failure of the procedure. Sterilization is nearly 100% effective, but it can fail. In rare cases, the fallopian tubes can grow back together over time. If this happens, a female will be able to get pregnant again. A higher risk of having an ectopic pregnancy. An ectopic pregnancy is a pregnancy that grows outside of the uterus. This kind of pregnancy can lead to serious bleeding if it is not treated. Female sterilization risks may include: Bleeding and swelling of the scrotum. Failure of the procedure. There is a very small chance that the tied or cauterized  ends of the vas deferens may reconnect (recanalization). If this happens, a female could still make a female pregnant. Other risks may include: A risk that you may change your mind and decide you want have children. Sterilization may be reversed, but a reversal  is not always successful. Lack of protection against sexually transmitted infections (STIs). What happens during the procedure? The steps of the procedure depend on the type of sterilization you are having. The procedure may vary among health care providers and hospitals. Questions to ask your health care provider How effective are sterilization procedures? What type of procedure is right for me? Is it possible to reverse the procedure if I change my mind? What can I expect after the procedure? Where to find more information [employer]: JewelryExec.com.pt U.S. Department of Health and Human Services: DustingSprays.pl [facility]: www.urologyhealth.org Summary Sterilization is surgery to prevent pregnancy. There are different types of sterilization surgeries. Sterilization may be reversed, but a reversal is not always successful. Sterilization does not protect against STIs. This information is not intended to replace advice given to you by your health care provider. Make sure you discuss any questions you have with your health care provider. Document Revised: 02/09/2020 Document Reviewed: 02/09/2020 Elsevier Patient Education  2022 [employer].

## 2021-02-19 NOTE — Progress Notes (Signed)
  Subjective  Fetal Movement? yes Contractions? no Leaking Fluid? no Vaginal Bleeding? no  Objective  BP 120/80   Wt 200 lb (90.7 kg)   LMP 08/25/2020 (Approximate)   BMI 37.79 kg/m  General: NAD Pumonary: no increased work of breathing Abdomen: gravid, non-tender Extremities: no edema Psychiatric: mood appropriate, affect full  Assessment  40 y.o. [id number] at [redacted]w[redacted]d by  07/11/2021, by Ultrasound presenting for routine prenatal visit  Plan   Problem List Items Addressed This Visit      Other   Supervision of high risk pregnancy, antepartum   Advanced maternal age in multigravida  Other Visit Diagnoses    Supervision of high risk pregnancy in second trimester    -  Primary   [redacted] weeks gestation of pregnancy       Screening for diabetes mellitus       Relevant Orders   28 Week RH+Panel    MFM [place] and growth monthly Glucola 2 mos (ordered)\ Flu shot UTD PNV Plans to breats feed Discussed contraception, planning Nexplanon, Vasec, or BTL DIscussed AMA risks      Nursing Staff Provider  Office Location  [facility] Dating  [place]  Language  English Anatomy US  MFM  Flu Vaccine  02/02/21 Genetic Screen  NIPS: nml XY   TDaP vaccine    Hgb A1C or  GTT Early :116 Third trimester :   Covid    LAB RESULTS   Rhogam  n/a Blood Type A/Positive/-- (07/27 1009)   Feeding Plan Breast Antibody Negative (07/27 1009)  Contraception Nexpl or BTL If CS then BTL Rubella 5.32 (07/27 1009)  Circumcision  RPR Non Reactive (07/27 1009)   Pediatrician   HBsAg Negative (07/27 1009)   Support Person  HIV Non Reactive (07/27 1009)  Prenatal Classes  Varicella Immune    GBS  (For PCN allergy, check sensitivities)   BTL Consent Not needed    VBAC Consent n/a Pap  02/18/20        Barnett Applebaum, MD, [facility], [facility] 02/19/2021  10:16 AM

## 2021-02-19 NOTE — Progress Notes (Unsigned)
u

## 2021-03-18 ENCOUNTER — Ambulatory Visit: Payer: Managed Care, Other (non HMO) | Attending: Obstetrics and Gynecology

## 2021-03-18 ENCOUNTER — Ambulatory Visit: Payer: Managed Care, Other (non HMO) | Admitting: *Deleted

## 2021-03-18 ENCOUNTER — Encounter: Payer: Self-pay | Admitting: *Deleted

## 2021-03-18 ENCOUNTER — Other Ambulatory Visit: Payer: Self-pay

## 2021-03-18 VITALS — BP 118/72 | HR 73

## 2021-03-18 DIAGNOSIS — Z363 Encounter for antenatal screening for malformations: Secondary | ICD-10-CM | POA: Diagnosis not present

## 2021-03-18 DIAGNOSIS — O0990 Supervision of high risk pregnancy, unspecified, unspecified trimester: Secondary | ICD-10-CM | POA: Diagnosis present

## 2021-03-18 DIAGNOSIS — O99212 Obesity complicating pregnancy, second trimester: Secondary | ICD-10-CM | POA: Diagnosis present

## 2021-03-18 DIAGNOSIS — O0992 Supervision of high risk pregnancy, unspecified, second trimester: Secondary | ICD-10-CM | POA: Insufficient documentation

## 2021-03-18 DIAGNOSIS — Z6833 Body mass index (BMI) 33.0-33.9, adult: Secondary | ICD-10-CM | POA: Diagnosis present

## 2021-03-18 DIAGNOSIS — O09522 Supervision of elderly multigravida, second trimester: Secondary | ICD-10-CM | POA: Diagnosis not present

## 2021-03-18 DIAGNOSIS — Z362 Encounter for other antenatal screening follow-up: Secondary | ICD-10-CM | POA: Insufficient documentation

## 2021-03-18 DIAGNOSIS — Z3A23 23 weeks gestation of pregnancy: Secondary | ICD-10-CM | POA: Diagnosis not present

## 2021-03-18 DIAGNOSIS — O4402 Placenta previa specified as without hemorrhage, second trimester: Secondary | ICD-10-CM | POA: Insufficient documentation

## 2021-03-19 ENCOUNTER — Telehealth: Payer: Self-pay | Admitting: Obstetrics and Gynecology

## 2021-03-19 ENCOUNTER — Encounter: Payer: Self-pay | Admitting: Obstetrics and Gynecology

## 2021-03-19 ENCOUNTER — Ambulatory Visit (INDEPENDENT_AMBULATORY_CARE_PROVIDER_SITE_OTHER): Payer: Managed Care, Other (non HMO) | Admitting: Obstetrics and Gynecology

## 2021-03-19 VITALS — BP 110/70 | Ht 61.0 in | Wt 203.6 lb

## 2021-03-19 DIAGNOSIS — Z3A23 23 weeks gestation of pregnancy: Secondary | ICD-10-CM

## 2021-03-19 DIAGNOSIS — O0990 Supervision of high risk pregnancy, unspecified, unspecified trimester: Secondary | ICD-10-CM

## 2021-03-19 LAB — POCT URINALYSIS DIPSTICK OB
Glucose, UA: NEGATIVE
POC,PROTEIN,UA: NEGATIVE

## 2021-03-19 NOTE — Patient Instructions (Signed)

## 2021-03-19 NOTE — Progress Notes (Signed)
Routine Prenatal Care Visit  Subjective  Deborah Mccarthy is a 40 y.o. [id number] at [redacted]w[redacted]d being seen today for ongoing prenatal care.  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  ----------------------------------------------------------------------------------- Patient reports no complaints.   Contractions: Not present. Vag. Bleeding: None.  Movement: Present. Denies leaking of fluid.  ----------------------------------------------------------------------------------- The following portions of the patient's history were reviewed and updated as appropriate: allergies, current medications, past family history, past medical history, past social history, past surgical history and problem list. Problem list updated.   Objective  Blood pressure 110/70, height 5\' 1"  (1.549 m), weight 203 lb 9.6 oz (92.4 kg), last menstrual period 08/25/2020. Pregravid weight 178 lb (80.7 kg) Total Weight Gain 25 lb 9.6 oz (11.6 kg) Urinalysis:      Fetal Status: Fetal Heart Rate (bpm): 140 Fundal Height: 28 cm Movement: Present     General:  Alert, oriented and cooperative. Patient is in no acute distress.  Skin: Skin is warm and dry. No rash noted.   Cardiovascular: Normal heart rate noted  Respiratory: Normal respiratory effort, no problems with respiration noted  Abdomen: Soft, gravid, appropriate for gestational age. Pain/Pressure: Absent     Pelvic:  Cervical exam deferred        Extremities: Normal range of motion.  Edema: None  Mental Status: Normal mood and affect. Normal behavior. Normal judgment and thought content.     Assessment   39 y.o. [id number] at [redacted]w[redacted]d by  07/11/2021, by Ultrasound presenting for routine  prenatal visit  Plan   FIFTH Problems (from 11/14/20 to present)     Problem Noted Resolved   Supervision of high risk pregnancy, antepartum 11/14/2020 by Will Bonnet, MD No   Overview Addendum 02/19/2021 10:15 AM by Gae Dry, MD     Nursing Staff Provider  Office Location  [facility] Dating  [place]  Language  English Anatomy US  MFM  Flu Vaccine  02/02/21 Genetic Screen  NIPS: nml XY   TDaP vaccine    Hgb A1C or  GTT Early :116 Third trimester :   Covid    LAB RESULTS   Rhogam  n/a Blood Type A/Positive/-- (07/27 1009)   Feeding Plan Breast Antibody Negative (07/27 1009)  Contraception Nexpl or BTL If CS then BTL Rubella 5.32 (07/27 1009)  Circumcision  RPR Non Reactive (07/27 1009)   Pediatrician   HBsAg Negative (07/27 1009)   Support Person  HIV Non Reactive (07/27 1009)  Prenatal Classes  Varicella Immune    GBS  (For PCN allergy, check sensitivities)   BTL Consent Not needed    VBAC Consent n/a Pap  02/18/20         Advanced maternal age in multigravida 11/14/2020 by Will Bonnet, MD No   Obesity affecting pregnancy 11/14/2020 by Will Bonnet, MD No       Discussed FMLA paperwork which is completed by front desk Discussed NOB visit can not be billed as an annual physical- discussed with Deborah Mccarthy- she will reach out to insurance.  Discussed limited [place] availability in office and growth [place] might need to continue through MFM. Patient would like to minimize if possible because of expense with being seen in their office.   Gestational age appropriate obstetric precautions including  but not limited to vaginal bleeding, contractions, leaking of fluid and fetal movement were reviewed in detail with the patient.    Return in about 4 weeks (around 04/16/2021) for ROB and 1 GTT.  Homero Fellers MD [facility], [facility] 03/19/2021, 8:45 AM

## 2021-03-19 NOTE — Telephone Encounter (Signed)
This pt has called about her prenatal visit in June.  She is wanting it to be coded a Wellness visit for work purposes.  She gets points from her work for having the well visit.  Some kind of work Aeronautical engineer.

## 2021-04-14 ENCOUNTER — Other Ambulatory Visit: Payer: Managed Care, Other (non HMO)

## 2021-04-14 ENCOUNTER — Encounter: Payer: Managed Care, Other (non HMO) | Admitting: Obstetrics

## 2021-04-15 ENCOUNTER — Encounter: Payer: Managed Care, Other (non HMO) | Admitting: Obstetrics & Gynecology

## 2021-04-15 ENCOUNTER — Other Ambulatory Visit: Payer: Managed Care, Other (non HMO)

## 2021-04-22 ENCOUNTER — Other Ambulatory Visit: Payer: Managed Care, Other (non HMO)

## 2021-04-22 ENCOUNTER — Other Ambulatory Visit: Payer: Self-pay

## 2021-04-22 ENCOUNTER — Ambulatory Visit (INDEPENDENT_AMBULATORY_CARE_PROVIDER_SITE_OTHER): Payer: Managed Care, Other (non HMO) | Admitting: Obstetrics

## 2021-04-22 VITALS — BP 114/72 | Ht 61.0 in | Wt 205.4 lb

## 2021-04-22 DIAGNOSIS — Z131 Encounter for screening for diabetes mellitus: Secondary | ICD-10-CM

## 2021-04-22 DIAGNOSIS — Z3A28 28 weeks gestation of pregnancy: Secondary | ICD-10-CM

## 2021-04-22 DIAGNOSIS — O0990 Supervision of high risk pregnancy, unspecified, unspecified trimester: Secondary | ICD-10-CM

## 2021-04-22 LAB — POCT URINALYSIS DIPSTICK OB
Glucose, UA: NEGATIVE
POC,PROTEIN,UA: NEGATIVE

## 2021-04-22 NOTE — Progress Notes (Signed)
Routine Prenatal Care Visit  Subjective  Deborah Mccarthy is a 40 y.o. [id number] at [redacted]w[redacted]d being seen today for ongoing prenatal care.  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  ----------------------------------------------------------------------------------- Patient reports no complaints.   Contractions: Not present. Vag. Bleeding: None.  Movement: Present. Leaking Fluid denies.  ----------------------------------------------------------------------------------- The following portions of the patient's history were reviewed and updated as appropriate: allergies, current medications, past family history, past medical history, past social history, past surgical history and problem list. Problem list updated.  Objective  Blood pressure 114/72, height 5\' 1"  (1.549 m), weight 205 lb 6.4 oz (93.2 kg), last menstrual period 08/25/2020. Pregravid weight 178 lb (80.7 kg) Total Weight Gain 27 lb 6.4 oz (12.4 kg) Urinalysis: Urine Protein Negative  Urine Glucose Negative  Fetal Status: Fetal Heart Rate (bpm): 135 Fundal Height: 32 cm Movement: Present     General:  Alert, oriented and cooperative. Patient is in no acute distress.  Skin: Skin is warm and dry. No rash noted.   Cardiovascular: Normal heart rate noted  Respiratory: Normal respiratory effort, no problems with respiration noted  Abdomen: Soft, gravid, appropriate for gestational age. Pain/Pressure: Absent     Pelvic:  Cervical exam deferred        Extremities: Normal range of motion.     Mental Status: Normal mood and affect. Normal behavior. Normal judgment and thought content.   Assessment   40 y.o. [id number] at [redacted]w[redacted]d by  07/11/2021, by Ultrasound  presenting for routine prenatal visit  Plan   FIFTH Problems (from 11/14/20 to present)    Problem Noted Resolved   Supervision of high risk pregnancy, antepartum 11/14/2020 by Will Bonnet, MD No   Overview Addendum 02/19/2021 10:15 AM by Gae Dry, MD     Nursing Staff Provider  Office Location  [facility] Dating  [place]  Language  English Anatomy US  MFM  Flu Vaccine  02/02/21 Genetic Screen  NIPS: nml XY   TDaP vaccine    Hgb A1C or  GTT Early :116 Third trimester :   Covid    LAB RESULTS   Rhogam  n/a Blood Type A/Positive/-- (07/27 1009)   Feeding Plan Breast Antibody Negative (07/27 1009)  Contraception Nexpl or BTL If CS then BTL Rubella 5.32 (07/27 1009)  Circumcision  RPR Non Reactive (07/27 1009)   Pediatrician   HBsAg Negative (07/27 1009)   Support Person  HIV Non Reactive (07/27 1009)  Prenatal Classes  Varicella Immune    GBS  (For PCN allergy, check sensitivities)   BTL Consent Not needed    VBAC Consent n/a Pap  02/18/20         Advanced maternal age in multigravida 11/14/2020 by Will Bonnet, MD No   Obesity affecting pregnancy 11/14/2020 by Will Bonnet, MD No       Preterm labor symptoms and general obstetric precautions including but not limited to vaginal bleeding, contractions, leaking of fluid and fetal movement were reviewed in detail with the patient. Please refer to After Visit Summary for other counseling recommendations.  28 week labs today.  Schedule for growth scan ordered for a month from now with MFM. Return in about 2 weeks (around 05/06/2021) for return OB.  Imagene Riches, CNM  04/22/2021  9:34 AM

## 2021-04-23 ENCOUNTER — Other Ambulatory Visit: Payer: Self-pay | Admitting: Obstetrics & Gynecology

## 2021-04-23 DIAGNOSIS — O99810 Abnormal glucose complicating pregnancy: Secondary | ICD-10-CM

## 2021-04-23 LAB — 28 WEEK RH+PANEL
Basophils Absolute: 0 10*3/uL (ref 0.0–0.2)
Basos: 0 %
EOS (ABSOLUTE): 0.4 10*3/uL (ref 0.0–0.4)
Eos: 3 %
Gestational Diabetes Screen: 178 mg/dL — ABNORMAL HIGH (ref 70–139)
HIV Screen 4th Generation wRfx: NONREACTIVE
Hematocrit: 30.7 % — ABNORMAL LOW (ref 34.0–46.6)
Hemoglobin: 10.5 g/dL — ABNORMAL LOW (ref 11.1–15.9)
Immature Grans (Abs): 0.1 10*3/uL (ref 0.0–0.1)
Immature Granulocytes: 1 %
Lymphocytes Absolute: 2.3 10*3/uL (ref 0.7–3.1)
Lymphs: 16 %
MCH: 29.5 pg (ref 26.6–33.0)
MCHC: 34.2 g/dL (ref 31.5–35.7)
MCV: 86 fL (ref 79–97)
Monocytes Absolute: 0.6 10*3/uL (ref 0.1–0.9)
Monocytes: 4 %
Neutrophils Absolute: 10.6 10*3/uL — ABNORMAL HIGH (ref 1.4–7.0)
Neutrophils: 76 %
Platelets: 336 10*3/uL (ref 150–450)
RBC: 3.56 x10E6/uL — ABNORMAL LOW (ref 3.77–5.28)
RDW: 11.9 % (ref 11.7–15.4)
RPR Ser Ql: NONREACTIVE
WBC: 13.9 10*3/uL — ABNORMAL HIGH (ref 3.4–10.8)

## 2021-04-23 NOTE — Progress Notes (Signed)
Sch 3 hour GTT tomorrow or next week (no later)

## 2021-04-28 ENCOUNTER — Other Ambulatory Visit: Payer: Self-pay

## 2021-04-28 ENCOUNTER — Other Ambulatory Visit: Payer: Managed Care, Other (non HMO)

## 2021-04-28 DIAGNOSIS — O99810 Abnormal glucose complicating pregnancy: Secondary | ICD-10-CM

## 2021-04-29 LAB — GESTATIONAL GLUCOSE TOLERANCE
Glucose, Fasting: 111 mg/dL — ABNORMAL HIGH (ref 70–94)
Glucose, GTT - 1 Hour: 240 mg/dL — ABNORMAL HIGH (ref 70–179)
Glucose, GTT - 2 Hour: 177 mg/dL — ABNORMAL HIGH (ref 70–154)
Glucose, GTT - 3 Hour: 94 mg/dL (ref 70–139)

## 2021-05-03 ENCOUNTER — Other Ambulatory Visit: Payer: Self-pay | Admitting: Obstetrics & Gynecology

## 2021-05-03 MED ORDER — ACCU-CHEK NANO SMARTVIEW W/DEVICE KIT: 1.0000 | PACK | 0 refills | Status: DC

## 2021-05-03 MED ORDER — ACCU-CHEK SOFTCLIX LANCETS MISC: 12 refills | Status: DC

## 2021-05-03 MED ORDER — GLUCOSE BLOOD VI STRP: ORAL_STRIP | 12 refills | Status: DC

## 2021-05-05 ENCOUNTER — Other Ambulatory Visit: Payer: Self-pay | Admitting: Obstetrics & Gynecology

## 2021-05-05 MED ORDER — ACCU-CHEK GUIDE W/DEVICE KIT: PACK | 2 refills | Status: DC

## 2021-05-07 ENCOUNTER — Ambulatory Visit (INDEPENDENT_AMBULATORY_CARE_PROVIDER_SITE_OTHER): Payer: Managed Care, Other (non HMO) | Admitting: Advanced Practice Midwife

## 2021-05-07 ENCOUNTER — Other Ambulatory Visit: Payer: Self-pay

## 2021-05-07 VITALS — BP 120/80 | Wt 206.0 lb

## 2021-05-07 DIAGNOSIS — O24419 Gestational diabetes mellitus in pregnancy, unspecified control: Secondary | ICD-10-CM | POA: Insufficient documentation

## 2021-05-07 DIAGNOSIS — Z3A30 30 weeks gestation of pregnancy: Secondary | ICD-10-CM

## 2021-05-07 DIAGNOSIS — O0990 Supervision of high risk pregnancy, unspecified, unspecified trimester: Secondary | ICD-10-CM

## 2021-05-07 DIAGNOSIS — O09523 Supervision of elderly multigravida, third trimester: Secondary | ICD-10-CM

## 2021-05-07 DIAGNOSIS — O24410 Gestational diabetes mellitus in pregnancy, diet controlled: Secondary | ICD-10-CM

## 2021-05-07 DIAGNOSIS — O99213 Obesity complicating pregnancy, third trimester: Secondary | ICD-10-CM

## 2021-05-07 NOTE — Progress Notes (Signed)
Routine Prenatal Care Visit  Subjective  Deborah Mccarthy is a 40 y.o. [id number] at [redacted]w[redacted]d being seen today for ongoing prenatal care.  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  ----------------------------------------------------------------------------------- Patient reports no complaints.  We discussed diagnosis of GDM, management, treatment, risks, etc.  Referral sent to [facility]. Information and log given. She started checking her blood sugar yesterday.   Contractions: Not present. Vag. Bleeding: None.  Movement: Present. Leaking Fluid denies.  ----------------------------------------------------------------------------------- The following portions of the patient's history were reviewed and updated as appropriate: allergies, current medications, past family history, past medical history, past social history, past surgical history and problem list. Problem list updated.  Objective  Blood pressure 120/80, weight 206 lb (93.4 kg), last menstrual period 08/25/2020. Pregravid weight 178 lb (80.7 kg) Total Weight Gain 28 lb (12.7 kg) Urinalysis: Urine Protein    Urine Glucose    Fetal Status: Fetal Heart Rate (bpm): 123 Fundal Height: 32 cm Movement: Present      BS since yesterday Fasting: 103, 95 After breakfast 130s After lunch 120s After dinner 130s  General:  Alert, oriented and cooperative. Patient is in no acute distress.  Skin: Skin is warm and dry. No rash noted.   Cardiovascular: Normal heart rate noted  Respiratory: Normal respiratory effort, no problems with respiration noted  Abdomen: Soft, gravid, appropriate for gestational age. Pain/Pressure: Absent     Pelvic:   Cervical exam deferred        Extremities: Normal range of motion.  Edema: None  Mental Status: Normal mood and affect. Normal behavior. Normal judgment and thought content.   Assessment   40 y.o. [id number] at [redacted]w[redacted]d by  07/11/2021, by Ultrasound presenting for routine prenatal visit  Plan   FIFTH Problems (from 11/14/20 to present)    Problem Noted Resolved   Gestational diabetes 05/07/2021 by Rod Can, CNM No   Supervision of high risk pregnancy, antepartum 11/14/2020 by Will Bonnet, MD No   Overview Addendum 02/19/2021 10:15 AM by Gae Dry, MD     Nursing Staff Provider  Office Location  [facility] Dating  [place]  Language  English Anatomy US  MFM  Flu Vaccine  02/02/21 Genetic Screen  NIPS: nml XY   TDaP vaccine    Hgb A1C or  GTT Early :116 Third trimester :   Covid    LAB RESULTS   Rhogam  n/a Blood Type A/Positive/-- (07/27 1009)   Feeding Plan Breast Antibody Negative (07/27 1009)  Contraception Nexpl or BTL If CS then BTL Rubella 5.32 (07/27 1009)  Circumcision  RPR Non Reactive (07/27 1009)   Pediatrician   HBsAg Negative (07/27 1009)   Support Person  HIV Non Reactive (07/27 1009)  Prenatal Classes  Varicella Immune    GBS  (For PCN allergy, check sensitivities)   BTL Consent Not needed    VBAC Consent n/a Pap  02/18/20         Advanced maternal age in multigravida 11/14/2020 by Will Bonnet, MD No   Obesity affecting pregnancy 11/14/2020 by Will Bonnet, MD No       Preterm labor symptoms and general obstetric precautions including but not limited to vaginal bleeding, contractions, leaking of fluid and fetal movement were reviewed in detail  with the patient. Please refer to After Visit Summary for other counseling recommendations.   Return in about 1 week (around 05/14/2021) for rob/BS log evaluation.  Rod Can, CNM 05/07/2021 3:12 PM

## 2021-05-07 NOTE — Patient Instructions (Signed)
Gestational Diabetes Mellitus, Self-Care When you have gestational diabetes mellitus, you must make sure your blood sugar (glucose) stays at a healthy level. What are the risks? If you do not get treated for this condition, it may cause problems for you and your unborn baby. For the mother Giving birth to the baby early. Having problems during labor and when giving birth. Needing surgery to give birth to the baby (cesarean delivery). Having problems with blood pressure. Getting this form of diabetes again when pregnant. Getting type 2 diabetes in the future. For the baby Low blood sugar. Bigger body size than is normal. Breathing problems. How to monitor blood sugar Check your blood sugar every day while you are pregnant. Check it as often as told by your doctor. To do this: Wash your hands with soap and water for at least 20 seconds. Prick the side of your finger (not the tip) with the lancet. Use a different finger each time. Gently rub the finger until a small drop of blood appears. Follow instructions that come with your meter for: Putting in the test strip. Putting blood on the strip. Getting the result. Write down your result and any notes. In general, your blood sugar levels should be: 95 mg/dL (5.3 mmol/L) if you have not eaten. 140 mg/dL (7.8 mmol/L) 1 hour after a meal. 120 mg/dL (6.7 mmol/L) 2 hours after a meal. Follow these instructions at home: Medicines Take over-the-counter and prescription medicines only as told by your doctor. If your doctor prescribed insulin or other diabetes medicines: Take them every day. Do not run out of insulin or other medicines. Plan ahead so you always have them. Eating and drinking  Follow instructions from your doctor about eating or drinking restrictions. See a food expert (dietician) to help you create an eating plan that helps control your blood sugar. The foods in this plan will include: Low-fat proteins. Dried beans, nuts,  and whole grain breads, cereals, or pasta. Fresh fruits and vegetables. Low-fat dairy products. Healthy fats. Eat healthy snacks between healthy meals. Drink enough fluid to keep your pee (urine) pale yellow. Keep track of carbs that you eat. To do this: Read food labels. Learn the serving sizes of foods. Follow your sick day plan when you cannot eat or drink normally. Make this plan with your doctor so it is ready to use. Activity Do exercises as told by your doctor. Exercise for 30 or more minutes a day, or as much as your doctor recommends. To help you control blood sugar levels after a meal: Do 10 minutes of exercise after each meal. Start this exercise 30 minutes after the meal. Talk with your doctor before you start a new exercise. Your doctor may tell you to change your insulin, other medicines, or food. Lifestyle Do not drink alcohol. Do not use any products that contain nicotine or tobacco, such as cigarettes, e-cigarettes, and chewing tobacco. If you need help quitting, ask your doctor. Learn how to deal with stress. If you need help with this, ask your doctor. Body care Stay up to date with your shots (vaccines). Take good care of your teeth. To do this: Brush your teeth and gums two times a day. Floss one or more times a day. Go to the dentist one or more times every 6 months. Stay at a healthy weight while you are pregnant. General instructions Ask your doctor about risks of high blood pressure in pregnancy. Share your diabetes care plan with: Your work or school. People  you live with. Check your pee for ketones: When you are sick. As told by your doctor. Carry a card or wear a bracelet that says you have diabetes. Keep all follow-up visits. Care after giving birth Have your blood sugar checked 4-12 weeks after you give birth. Get checked for diabetes one or more times every 3 years or as told. Where to find more information American Diabetes Association (ADA):  diabetes.org Association of Diabetes Care & Education Specialists (ADCES): diabeteseducator.org Centers for Disease Control and Prevention (CDC): StoreMirror.com.cy American Pregnancy Association: americanpregnancy.org U.S. Department of Agriculture MyPlate: http://www.wilson-mendoza.org/ Contact a doctor if: Your blood sugar is above your target for two tests in a row. You have a fever. You are sick for 2 days or more and do not get better. You have either of these problems for more than 6 hours: You vomit every time you eat or drink. You have watery poop (diarrhea). Get help right away if: You cannot think clearly. You have trouble breathing. You have moderate or high ketones in your pee. Blood or abnormal fluid starts to come out of your vagina. You feel your baby is not moving as usual. You start having early contractions. You may feel your belly tighten. You have a very bad headache. These symptoms may be an emergency. Get help right away. Call your local emergency services (911 in the [place] Do not wait to see if the symptoms will go away. Do not drive yourself to the hospital. Summary Check your blood sugar (glucose) while you are pregnant. Check it as often as told by your doctor. Take your insulin and diabetes medicines as told. Have your blood sugar checked 4-12 weeks after you give birth. Keep all follow-up visits. This information is not intended to replace advice given to you by your health care provider. Make sure you discuss any questions you have with your health care provider. Document Revised: 10/15/2019 Document Reviewed: 10/15/2019 Elsevier Patient Education  [place]. Gestational Diabetes Mellitus, Diagnosis Gestational diabetes mellitus is a form of diabetes. It can happen when you are pregnant. The diabetes goes away after you give birth. If you do not get treated for this condition, it may cause problems for you or your baby. What are the causes? This condition is caused by  changes in your body when you are pregnant. When these happen: A part of the body called the pancreas does not make enough insulin. The body cannot use insulin in the right way. Sugars cannot get into cells in your body. The sugars stay in your blood. This leads to high blood sugar. What increases the risk? Being older than age 45 when pregnant. Having someone with diabetes in your family. Too much body weight. Having had this condition in the past. Polycystic ovary syndrome. Being pregnant with more than one baby. What are the signs or symptoms? Being thirsty often. Being hungry often. Needing to pee more often. How is this treated? Eat a healthy diet. Get more exercise. Check your blood sugar often. Take insulin and other medicines, if needed. Work with an Cytogeneticist on this condition, if told. Follow these instructions at home: Learn about your diabetes Ask your doctor: How often should I check my blood sugar? Where do I get the equipment? What medicines do I need? When should I take them? Do I need to meet with an educator? Who can I call if I have questions? Where can I find a support group? General instructions Take medicines only as told  by your doctor. Stay at a healthy weight. Drink enough fluid to keep your pee pale yellow. Wear an alert bracelet or carry a card that shows you have this condition. Keep all follow-up visits. Where to find more information American Diabetes Association (ADA): diabetes.org Association of Diabetes Care & Education Specialists (ADCES): diabeteseducator.org Centers for Disease Control and Prevention (CDC): StoreMirror.com.cy American Pregnancy Association: americanpregnancy.org U.S. Department of Agriculture MyPlate: http://www.wilson-mendoza.org/ Contact a doctor if: Your blood sugar is at or above 240 mg/dL (13.3 mmol/L). Your blood sugar is at or above 200 mg/dL (11.1 mmol/L) and you have ketones in your pee. You have a fever. You are sick for 2 days or more and  you do not get better. You have either of these problems for more than 6 hours: You vomit every time you eat or drink. You have watery poop (diarrhea). Get help right away if: You cannot think clearly. You are not breathing well. You have a lot of ketones in your pee. Your baby seems to move less than normal. Abnormal fluid or blood starts to come out of your vagina. You start having contractions before your due date. You may feel your belly tighten. You have a very bad headache. These symptoms may be an emergency. Get help right away. Call your local emergency services (911 in the [place] Do not wait to see if the symptoms will go away. Do not drive yourself to the hospital. Summary Gestational diabetes is a form of diabetes. It can happen when you are pregnant. This condition occurs when your body cannot make or use insulin in the right way. Eat a healthy diet, exercise, and use medicines or insulin as told by your doctor. Tell your doctor if your blood sugar is high, you have a fever, or you vomit every time you eat or drink. Get help right away if you cannot think clearly, you are not breathing well, or your baby seems to move less than normal. This information is not intended to replace advice given to you by your health care provider. Make sure you discuss any questions you have with your health care provider. Document Revised: 10/15/2019 Document Reviewed: 10/15/2019 Elsevier Patient Education  [employer].

## 2021-05-11 ENCOUNTER — Ambulatory Visit: Payer: Managed Care, Other (non HMO)

## 2021-05-12 ENCOUNTER — Encounter: Payer: Self-pay | Admitting: *Deleted

## 2021-05-12 ENCOUNTER — Other Ambulatory Visit: Payer: Self-pay

## 2021-05-12 ENCOUNTER — Encounter: Payer: Managed Care, Other (non HMO) | Attending: Advanced Practice Midwife | Admitting: *Deleted

## 2021-05-12 VITALS — BP 116/76 | Ht 61.0 in | Wt 205.6 lb

## 2021-05-12 DIAGNOSIS — O24410 Gestational diabetes mellitus in pregnancy, diet controlled: Secondary | ICD-10-CM | POA: Insufficient documentation

## 2021-05-12 DIAGNOSIS — Z3A00 Weeks of gestation of pregnancy not specified: Secondary | ICD-10-CM | POA: Diagnosis not present

## 2021-05-12 DIAGNOSIS — O09523 Supervision of elderly multigravida, third trimester: Secondary | ICD-10-CM | POA: Diagnosis not present

## 2021-05-12 DIAGNOSIS — O0993 Supervision of high risk pregnancy, unspecified, third trimester: Secondary | ICD-10-CM | POA: Insufficient documentation

## 2021-05-12 NOTE — Progress Notes (Signed)
Diabetes Self-Management Education  Visit Type: First/Initial  Appt. Start Time: 1510 Appt. End Time: 1645  05/12/2021  [name], identified by name and date of birth, is a 40 y.o. female with a diagnosis of Diabetes: Gestational Diabetes.   ASSESSMENT  Blood pressure 116/76, height 5\' 1"  (1.549 m), weight 205 lb 9.6 oz (93.3 kg), last menstrual period 08/25/2020, estimated date of delivery 07/11/2021 Body mass index is 38.85 kg/m.   Diabetes Self-Management Education - 05/12/21 1618       Visit Information   Visit Type First/Initial      Initial Visit   Diabetes Type Gestational Diabetes    Are you currently following a meal plan? Yes    What type of meal plan do you follow? "reducing sweets"    Are you taking your medications as prescribed? Yes    Date Diagnosed 1 week      Health Coping   How would you rate your overall health? Fair      Psychosocial Assessment   Patient Belief/Attitude about Diabetes Other (comment)   "time to change"   Self-care barriers None    Self-management support Doctor's office;Family    Patient Concerns Nutrition/Meal planning;Glycemic Control;Weight Control;Monitoring;Healthy Lifestyle;Other (comment)   "reduce stress and anxiety"   Special Needs None    Preferred Learning Style Visual;Hands on    Learning Readiness Change in progress    How often do you need to have someone help you when you read instructions, pamphlets, or other written materials from your doctor or pharmacy? 1 - Never    What is the last grade level you completed in school? BA      Pre-Education Assessment   Patient understands the diabetes disease and treatment process. Needs Instruction    Patient understands incorporating nutritional management into lifestyle. Needs Instruction    Patient undertands incorporating physical activity into lifestyle. Needs Instruction    Patient understands using medications safely. Needs Instruction    Patient understands  monitoring blood glucose, interpreting and using results Needs Review    Patient understands prevention, detection, and treatment of acute complications. Needs Instruction    Patient understands prevention, detection, and treatment of chronic complications. Needs Instruction    Patient understands how to develop strategies to address psychosocial issues. Needs Instruction    Patient understands how to develop strategies to promote health/change behavior. Needs Instruction      Complications   How often do you check your blood sugar? 3-4 times/day    Fasting Blood glucose range (mg/dL) 70-129   Pt left her glucometer at work. She reports FBG's range from 90-100 with reading of 103 mg/dL today.   Postprandial Blood glucose range (mg/dL) 70-129;130-179   She reports pp's range from 88-140 mg/dL.   Have you had a dilated eye exam in the past 12 months? Yes    Have you had a dental exam in the past 12 months? Yes    Are you checking your feet? No      Dietary Intake   Breakfast English muffin and fruit spread    Lunch chicken with couscous and raisins; cheese and grapes, lunchable, yogurt and granola bar; pizza,tuna salad; salad with meat, crackers and applesauce; left overs; fruits orange, strawberries, raspberries, pineapple, apple    Dinner chicken, beef, pork, spaghetti; potatoes, peas, corn, pasta, couscous; salads with lettuce, tomato, onions, cuccumbers, croutons, cheese, dressing    Snack (evening) sometimes late snack of chocolate or chocolate milk    Beverage(s) water, coffee  with stevia, juice, chocolate milk; diet soda, few sweet drinks from [employer]      Exercise   Exercise Type ADL's      Patient Education   Previous Diabetes Education No    Disease state  Definition of diabetes, type 1 and 2, and the diagnosis of diabetes;Factors that contribute to the development of diabetes    Nutrition management  Role of diet in the treatment of diabetes and the relationship between the  three main macronutrients and blood glucose level;Food label reading, portion sizes and measuring food.;Reviewed blood glucose goals for pre and post meals and how to evaluate the patients' food intake on their blood glucose level.    Physical activity and exercise  Role of exercise on diabetes management, blood pressure control and cardiac health.    Medications Other (comment)   Limited use of oral medications during pregnancy and potential for insulin.   Monitoring Purpose and frequency of SMBG.;Taught/discussed recording of test results and interpretation of SMBG.;Identified appropriate SMBG and/or A1C goals.;Ketone testing, when, how.    Chronic complications Relationship between chronic complications and blood glucose control    Psychosocial adjustment Identified and addressed patients feelings and concerns about diabetes    Preconception care Pregnancy and GDM  Role of pre-pregnancy blood glucose control on the development of the fetus;Reviewed with patient blood glucose goals with pregnancy;Role of family planning for patients with diabetes      Individualized Goals (developed by patient)   Reducing Risk Other (comment)   improve blood sugars, prevent diabetes complications, lose weight, lead a healthier lifestyle, become more fit, reduce stress/anxiety     Outcomes   Expected Outcomes Demonstrated interest in learning. Expect positive outcomes    Future DMSE 2 wks        Individualized Plan for Diabetes Self-Management Training:   Learning Objective:  Patient will have a greater understanding of diabetes self-management. Patient education plan is to attend individual and/or group sessions per assessed needs and concerns.   Plan:   Patient Instructions  Read booklet on Gestational Diabetes Follow Gestational Meal Planning Guidelines Limit desserts/sweets Avoid sugar sweetened drinks (juice, cappuccinos) Complete a 3 Day Food Record and bring to next appointment Check blood  sugars 4 x day - before breakfast and 2 hrs after every meal and record  Bring blood sugar log to all appointments Purchase urine ketone strips if instructed by MD and check urine ketones every am:  If + increase bedtime snack to 1 protein and 2 carbohydrate servings Walk 20-30 minutes at least 5 x week if permitted by MD  Expected Outcomes:  Demonstrated interest in learning. Expect positive outcomes  Education material provided:  Gestational Booklet Gestational Meal Planning Guidelines Simple Meal Plan Viewed Gestational Diabetes Video 3 Day Food Record Goals for a Healthy Pregnancy   If problems or questions, patient to contact team via:   Johny Drilling, RN, [place], [place] [phone]  Future DSME appointment: 2 wks May 27, 2021 with the dietitian

## 2021-05-12 NOTE — Patient Instructions (Signed)
Read booklet on Gestational Diabetes Follow Gestational Meal Planning Guidelines Limit desserts/sweets Avoid sugar sweetened drinks (juice, cappuccinos) Complete a 3 Day Food Record and bring to next appointment Check blood sugars 4 x day - before breakfast and 2 hrs after every meal and record  Bring blood sugar log to all appointments Purchase urine ketone strips if instructed by MD and check urine ketones every am:  If + increase bedtime snack to 1 protein and 2 carbohydrate servings Walk 20-30 minutes at least 5 x week if permitted by MD

## 2021-05-13 ENCOUNTER — Ambulatory Visit (HOSPITAL_BASED_OUTPATIENT_CLINIC_OR_DEPARTMENT_OTHER): Payer: Managed Care, Other (non HMO) | Admitting: Obstetrics and Gynecology

## 2021-05-13 ENCOUNTER — Ambulatory Visit (HOSPITAL_BASED_OUTPATIENT_CLINIC_OR_DEPARTMENT_OTHER): Payer: Managed Care, Other (non HMO)

## 2021-05-13 ENCOUNTER — Ambulatory Visit: Payer: Managed Care, Other (non HMO) | Attending: Obstetrics | Admitting: *Deleted

## 2021-05-13 VITALS — BP 111/64 | HR 75

## 2021-05-13 DIAGNOSIS — O0990 Supervision of high risk pregnancy, unspecified, unspecified trimester: Secondary | ICD-10-CM

## 2021-05-13 DIAGNOSIS — O99213 Obesity complicating pregnancy, third trimester: Secondary | ICD-10-CM

## 2021-05-13 DIAGNOSIS — O24419 Gestational diabetes mellitus in pregnancy, unspecified control: Secondary | ICD-10-CM | POA: Diagnosis not present

## 2021-05-13 DIAGNOSIS — Z3A31 31 weeks gestation of pregnancy: Secondary | ICD-10-CM | POA: Insufficient documentation

## 2021-05-13 DIAGNOSIS — O24410 Gestational diabetes mellitus in pregnancy, diet controlled: Secondary | ICD-10-CM

## 2021-05-13 DIAGNOSIS — E669 Obesity, unspecified: Secondary | ICD-10-CM | POA: Diagnosis not present

## 2021-05-13 DIAGNOSIS — O09523 Supervision of elderly multigravida, third trimester: Secondary | ICD-10-CM | POA: Diagnosis not present

## 2021-05-13 DIAGNOSIS — O0993 Supervision of high risk pregnancy, unspecified, third trimester: Secondary | ICD-10-CM | POA: Insufficient documentation

## 2021-05-13 NOTE — Progress Notes (Signed)
Pt states a "drastic reduction" in fetal movements all day yesterday and the day before.  She reports good movement today.

## 2021-05-13 NOTE — Progress Notes (Signed)
Maternal-Fetal Medicine   Name: [name] DOB: [birth year] MRN: [id number] Referring Provider: Rod Can, CNM  I had the pleasure of seeing Deborah Mccarthy today at the [facility]. She is G5 P2 at 31w 4d gestation and is here for ultrasound evaluation. She has a recent diagnosis of gestational diabetes.  Patient has recently started checking her blood glucose and reports they are within normal range. She does not have hypertension.  Blood pressure today at her office is 111/64 mmHg. Advanced maternal age.  On cell free fetal DNA screening, the risks of fetal aneuploidies are not increased.   Obstetric history significant for 2 term vaginal deliveries.  Ultrasound Fetal growth is appropriate for gestational age.  Amniotic fluid is normal and good fetal activity seen.  Gestational diabetes I explained the diagnosis of gestational diabetes.  I emphasized the importance of good blood glucose control to prevent adverse fetal or neonatal outcomes.  I discussed blood glucose normal values. I encouraged her to check her blood glucose regularly. Possible complications of gestational diabetes include fetal macrosomia, shoulder dystocia and birth injuries, stillbirth (in poorly controlled diabetes) and neonatal respiratory syndrome and other complications.  In about 85% of cases, gestational diabetes is well controlled by diet alone.  Exercise reduces the need for insulin.  Medical treatment includes oral hypoglycemics or insulin. If patient requires insulin or oral hypoglycemics, we recommend weekly antenatal testing from 32 weeks' gestation till delivery.  Timing of delivery: In well-controlled diabetes on diet, patient can be delivered at 39 weeks' gestation. Vaginal delivery is not contraindicated. Type 2 diabetes develops in about 25% to 40% of women with GDM. I recommend postpartum screening with 75-g glucose load at 6 to 12 weeks after delivery. Advanced maternal age (>40  years) A small increased risk of stillbirth is reported with advanced paternal age >40 years.  I recommend weekly BPP from [redacted] weeks gestation till delivery.  Delivery should be considered at [redacted] weeks gestation. Recommendations -An appointment was made for her to return in 4 weeks for fetal growth assessment and BPP. Weekly BPP from her next visit till delivery. -If patient requires oral hypoglycemics or insulin, we recommend weekly BPP till delivery.   Thank you for consultation.  If you have any questions or concerns, please contact me the [facility].  Consultation including face-to-face (more than 50%) counseling 30 minutes.

## 2021-05-14 ENCOUNTER — Ambulatory Visit (INDEPENDENT_AMBULATORY_CARE_PROVIDER_SITE_OTHER): Payer: Managed Care, Other (non HMO) | Admitting: Licensed Practical Nurse

## 2021-05-14 ENCOUNTER — Other Ambulatory Visit: Payer: Self-pay

## 2021-05-14 ENCOUNTER — Other Ambulatory Visit: Payer: Self-pay | Admitting: *Deleted

## 2021-05-14 ENCOUNTER — Encounter: Payer: Self-pay | Admitting: Licensed Practical Nurse

## 2021-05-14 VITALS — BP 120/78 | Wt 205.0 lb

## 2021-05-14 DIAGNOSIS — O24410 Gestational diabetes mellitus in pregnancy, diet controlled: Secondary | ICD-10-CM

## 2021-05-14 DIAGNOSIS — Z3A31 31 weeks gestation of pregnancy: Secondary | ICD-10-CM

## 2021-05-14 DIAGNOSIS — O09523 Supervision of elderly multigravida, third trimester: Secondary | ICD-10-CM

## 2021-05-14 DIAGNOSIS — O99213 Obesity complicating pregnancy, third trimester: Secondary | ICD-10-CM

## 2021-05-14 DIAGNOSIS — O24419 Gestational diabetes mellitus in pregnancy, unspecified control: Secondary | ICD-10-CM

## 2021-05-14 DIAGNOSIS — O0990 Supervision of high risk pregnancy, unspecified, unspecified trimester: Secondary | ICD-10-CM

## 2021-05-14 LAB — POCT URINALYSIS DIPSTICK OB
Glucose, UA: NEGATIVE
POC,PROTEIN,UA: NEGATIVE

## 2021-05-14 NOTE — Progress Notes (Signed)
ROB - no concerns, will do TDAP next visit. RM 4

## 2021-05-14 NOTE — Progress Notes (Signed)
Routine Prenatal Care Visit  Subjective  Deborah Mccarthy is a 40 y.o. [id number] at [redacted]w[redacted]d being seen today for ongoing prenatal care.  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  ----------------------------------------------------------------------------------- Patient reports has monitored blood glucose x1 week, 8/10 fasting and 3/16 PP elevated, she is not keeping a food diary, has nutrition consult in Jan. Discussed  pros/con of circ-undecided as what to she would like to do. Saw MFM yesterday-normal growth   .  .   . Leaking Fluid denies.  ----------------------------------------------------------------------------------- The following portions of the patient's history were reviewed and updated as appropriate: allergies, current medications, past family history, past medical history, past social history, past surgical history and problem list. Problem list updated.  Objective  Blood pressure 120/78, weight 205 lb (93 kg), last menstrual period 08/25/2020. Pregravid weight 178 lb (80.7 kg) Total Weight Gain 27 lb (12.2 kg) Urinalysis: Urine Protein Negative  Urine Glucose Negative  Fetal Status:         active   General:  Alert, oriented and cooperative. Patient is in no acute distress.  Skin: Skin is warm and dry. No rash noted.   Cardiovascular: Normal heart rate noted  Respiratory: Normal respiratory effort, no problems with respiration noted  Abdomen: Soft, gravid, appropriate for gestational age.       Pelvic:  Cervical exam deferred        Extremities: Normal range of motion.     Mental Status: Normal mood and affect. Normal behavior. Normal judgment and thought content.    Assessment   40 y.o. [id number] at [redacted]w[redacted]d by  07/11/2021, by Ultrasound presenting for routine prenatal visit  Plan   FIFTH Problems (from 11/14/20 to present)     Problem Noted Resolved   Gestational diabetes 05/07/2021 by Rod Can, CNM No   Supervision of high risk pregnancy, antepartum 11/14/2020 by Will Bonnet, MD No   Overview Addendum 05/14/2021  3:13 PM by Allen Derry, CNM     Nursing Staff Provider  Office Location  [facility] Dating  [place]  Language  English Anatomy US  MFM  Flu Vaccine  02/02/21 Genetic Screen  NIPS: nml XY   TDaP vaccine    Hgb A1C or  GTT Early :116 Third trimester :   Covid    LAB RESULTS   Rhogam  n/a Blood Type A/Positive/-- (07/27 1009)   Feeding Plan Breast Antibody Negative (07/27 1009)  Contraception Nexpl or BTL If CS then BTL Rubella 5.32 (07/27 1009)  Circumcision undecided RPR Non Reactive (07/27 1009)   Pediatrician  Princella Ion HBsAg Negative (07/27 1009)   Support Person  HIV Non Reactive (07/27 1009)  Prenatal Classes multip Varicella Immune    GBS  (For PCN allergy, check sensitivities)   BTL Consent Not needed    VBAC Consent n/a Pap  02/18/20         Advanced maternal age in multigravida 11/14/2020 by Will Bonnet, MD No   Obesity affecting pregnancy 11/14/2020 by Will Bonnet, MD No        Preterm labor symptoms and general obstetric precautions including but not limited to vaginal bleeding, contractions, leaking of fluid and fetal movement were reviewed in detail with the patient. Please refer to After Visit Summary  for other counseling recommendations.   Will have more frequent apts until blood glucose under control  Return in about 1 week (around 05/21/2021) for ROB with OB .  Growth [place] with BPP at 36 weeks, followed by weekly BPP's, birth in the 39th week of pregnancy    Roberto Scales, [place], [facility]  05/14/21  3:20 PM

## 2021-05-19 ENCOUNTER — Other Ambulatory Visit: Payer: Self-pay | Admitting: *Deleted

## 2021-05-19 DIAGNOSIS — O24410 Gestational diabetes mellitus in pregnancy, diet controlled: Secondary | ICD-10-CM

## 2021-05-19 DIAGNOSIS — Z6834 Body mass index (BMI) 34.0-34.9, adult: Secondary | ICD-10-CM

## 2021-05-19 DIAGNOSIS — O09523 Supervision of elderly multigravida, third trimester: Secondary | ICD-10-CM

## 2021-05-20 ENCOUNTER — Other Ambulatory Visit: Payer: Self-pay

## 2021-05-20 ENCOUNTER — Encounter: Payer: Self-pay | Admitting: Obstetrics and Gynecology

## 2021-05-20 ENCOUNTER — Ambulatory Visit (INDEPENDENT_AMBULATORY_CARE_PROVIDER_SITE_OTHER): Payer: Managed Care, Other (non HMO) | Admitting: Obstetrics and Gynecology

## 2021-05-20 VITALS — Wt 205.0 lb

## 2021-05-20 DIAGNOSIS — O24410 Gestational diabetes mellitus in pregnancy, diet controlled: Secondary | ICD-10-CM

## 2021-05-20 DIAGNOSIS — O0990 Supervision of high risk pregnancy, unspecified, unspecified trimester: Secondary | ICD-10-CM

## 2021-05-20 DIAGNOSIS — O09523 Supervision of elderly multigravida, third trimester: Secondary | ICD-10-CM

## 2021-05-20 NOTE — Progress Notes (Signed)
Routine Prenatal Care Visit- Virtual Visit  Subjective   Virtual Visit via Telephone Note  I connected with Deborah Mccarthy on 05/20/21 at  9:50 AM EST by telephone and verified that I am speaking with the correct person using two identifiers.   I discussed the limitations, risks, security and privacy concerns of performing an evaluation and management service by telephone and the availability of in person appointments. I also discussed with the patient that there may be a patient responsible charge related to this service. The patient expressed understanding and agreed to proceed.  The patient was at home I spoke with the patient from my office The names of people involved in this encounter were: Dr. Gilman Schmidt and Blain Pais.   Deborah Mccarthy is a 40 y.o. [id number] at [redacted]w[redacted]d being seen today for ongoing prenatal care.  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  ----------------------------------------------------------------------------------- Patient reports  cold like symptoms- negative covid test at home .    .  .   . Denies leaking of fluid.  ----------------------------------------------------------------------------------- The following portions of the patient's history were reviewed and updated as appropriate: allergies, current medications, past family history, past medical history, past social history, past surgical history and problem list. Problem list updated.   Objective  Last menstrual period 08/25/2020. Pregravid weight 178 lb (80.7 kg) Total Weight Gain 27 lb (12.2 kg) Urinalysis:      Fetal Status:           Physical Exam could not be performed. Because of  the COVID-19 outbreak this visit was performed over the phone and not in person.   Assessment   40 y.o. [id number] at [redacted]w[redacted]d by  07/11/2021, by Ultrasound presenting for routine prenatal visit  Plan   FIFTH Problems (from 11/14/20 to present)     Problem Noted Resolved   Gestational diabetes 05/07/2021 by Rod Can, CNM No   Supervision of high risk pregnancy, antepartum 11/14/2020 by Will Bonnet, MD No   Overview Addendum 05/14/2021  3:13 PM by Allen Derry, CNM     Nursing Staff Provider  Office Location  [facility] Dating  [place]  Language  English Anatomy US  MFM  Flu Vaccine  02/02/21 Genetic Screen  NIPS: nml XY   TDaP vaccine    Hgb A1C or  GTT Early :116 Third trimester :   Covid    LAB RESULTS   Rhogam  n/a Blood Type A/Positive/-- (07/27 1009)   Feeding Plan Breast Antibody Negative (07/27 1009)  Contraception Nexpl or BTL If CS then BTL Rubella 5.32 (07/27 1009)  Circumcision undecided RPR Non Reactive (07/27 1009)   Pediatrician  Princella Ion HBsAg Negative (07/27 1009)   Support Person  HIV Non Reactive (07/27 1009)  Prenatal Classes multip Varicella Immune    GBS  (For PCN allergy, check sensitivities)   BTL Consent Not needed    VBAC Consent n/a Pap  02/18/20         Advanced maternal age in multigravida 11/14/2020 by Will Bonnet, MD No   Obesity affecting pregnancy 11/14/2020 by Will Bonnet, MD No        Gestational age appropriate obstetric precautions including but not limited to vaginal bleeding, contractions, leaking of fluid and fetal  movement were reviewed in detail with the patient.    Per patient:  Fasting 95-103 After meals 83- 141, most are 120s      No medications at this time- reviewed dietary choices to continue to control glucose levels with diet.  Weekly ROB with NST until delivery.  Encouraged testing with urgent care for flu or covid for acute viral illness.   Follow Up Instructions:    I discussed the  assessment and treatment plan with the patient. The patient was provided an opportunity to ask questions and all were answered. The patient agreed with the plan and demonstrated an understanding of the instructions.   The patient was advised to call back or seek an in-person evaluation if the symptoms worsen or if the condition fails to improve as anticipated.  I provided 10 minutes of non-face-to-face time during this encounter.  No follow-ups on file.  Adrian Prows MD [facility], [facility] 05/20/2021 10:18 AM

## 2021-05-20 NOTE — Patient Instructions (Signed)
Fetal Health Monitoring Overview Health care providers use techniques and tests to monitor a developing baby (fetus) before birth. Evaluation of your baby's well-being can help your health care provider identify possible problems for you and your baby. Fetal assessments can: Guide health care providers in how best to care for your unborn baby. Check your unborn baby's overall health. All types of monitoring aim to protect your health and that of your baby. The best way to have a healthy pregnancy and a healthy baby is to learn as much as you can about your pregnancy and to work closely with your health care provider. Types of tests used in fetal monitoring Tests that will be done vary. Tests may include: Fetal movement counts. Non-stress test. Biophysical profile. Modified biophysical profile. Contraction stress test. Umbilical artery Doppler velocimetry. Your health care provider will determine which tests are right for you. No single test is perfectly accurate, and you may need to follow up with your health care provider if there are any concerns. Tell a health care provider about: Any allergies you have. All medicines you are taking, including vitamins, herbs, eye drops, creams, and over-the-counter medicines. Any blood disorders you have. Any surgeries you have had. Any medical conditions you have such as diabetes or high blood pressure. How often you feel your baby move. Any concerns you have about your pregnancy. What are the risks? Discuss your tests with your health care provider. In most cases, there are no risks to you or your baby during any of these tests. What happens before the test? Your health care provider will tell you how to prepare for the test. Ask your health care provider about eating and drinking, taking medicines, and other questions you may have. What happens during the test? Fetal movement counts After 16 weeks of pregnancy, you may feel your baby move. As your  baby gets bigger, these movements are easier to feel. A fetal movement count is when you count the number of kicks, flutters, swishes, rolls, or jabs over a specific period of time. You record the number and report it to your health care provider. This is strongly encouraged in high-risk pregnancies but is generally recommended for all pregnant women to do. Your health care provider may ask you to start counting fetal movements around 28 weeks of pregnancy. Non-stress test The non-stress test evaluates your baby's heartbeat while your baby is at rest and while your baby is moving. It is often done as part of a set of tests called the biophysical profile and may show signs that it is time for your baby to be delivered. The heart rate in a healthy baby will speed up when the baby moves or kicks. The heart rate will decrease at rest, and the peaks or accelerations of the heart rate will be lower. If the test brings up any questions or concerns, you may need to have more testing. This test may be done if your pregnancy goes past your due date. It is also commonly done in high-risk pregnancies beginning at 32 weeks. Biophysical profile The biophysical profile is a set of tests that are done to find out how well the baby is doing. It includes the non-stress test along with imaging tests that use sound waves (ultrasound) to create images of your baby. In a biophysical profile, the following tests are done and scored: Fetal heartbeat. Fetal breathing. Fetal movements. Fetal muscle tone. Amount of amniotic fluid. Each test gets a score. The scores are then added  together for a total score. A low score may mean that you and your baby need additional monitoring or special care. Sometimes your health care provider may recommend that you deliver early. This test is usually done late in your final 3 months (third trimester) of pregnancy. Modified biophysical profile A modified biophysical profile is a two-part test.  You will have: An ultrasound exam to check how much fluid is surrounding your baby inside of your womb (amniotic fluid). A non-stress test to check your baby's heart rate. The results will determine whether a full biophysical profile may be needed. This test is usually done late in your final 3 months (third trimester) of pregnancy. Contraction stress test A contraction stress test monitors the heart rate of your baby during contractions. The test checks to see how your baby will tolerate the stress of labor. Health care providers use this test to further evaluate your baby when other tests, such as the biophysical profile, have shown that there may be a problem. This test may be done: Any time you are contracting. Between weeks 32 and 34 of your pregnancy. Later in the pregnancy, if necessary. Umbilical artery Doppler velocimetry Umbilical artery Doppler velocimetry uses sound waves to measure the flow of blood between you and your baby. This test measures: The amount of blood flow through the cord that attaches your baby to your placenta (umbilical cord). Speed of the blood flow. You will likely only need this test to check your baby's condition if you have a high-risk pregnancy. What can I expect after the test? Your health care provider will discuss the results of testing and make a plan that is right for you and your baby. You may be given more instructions depending on the test you were given. Keep all follow-up visits, including visits for tests and routine care. This is important. Summary Various tests may be offered to you during your pregnancy to evaluate the well-being of your baby. Talk to your health care provider about any concerns you may have about your pregnancy and what tests might be right for you. Keep all follow-up visits. This is important. This information is not intended to replace advice given to you by your health care provider. Make sure you discuss any questions you  have with your health care provider. Document Revised: 01/21/2021 Document Reviewed: 09/25/2019 Elsevier Patient Education  [place].

## 2021-05-24 NOTE — L&D Delivery Note (Signed)
Date of delivery: 07/06/2021 Estimated Date of Delivery: 07/11/21 Patient's last menstrual period was 08/25/2020 (approximate). EGA: [redacted]w[redacted]d  Delivery Note At 3:44 PM a viable female was delivered via Vaginal, Spontaneous  Presentation: OA, ROT  APGAR: 9, 10;    Weight: 2810 g, 6 pounds 3 ounces Placenta status: Spontaneous, Intact.   Cord: 3 vessels with the following complications: None.  Cord pH: NA  Called to see patient.  Mom pushed 1 time to deliver a viable female infant.  The head followed by shoulders, which delivered without difficulty, and the rest of the body.  Nuchal cord noted and reduced on the perineum.  Baby to mom's chest.  Cord clamped and cut after 3 min delay.  No cord blood obtained.  Placenta delivered spontaneously, intact, with a 3-vessel cord.   All counts correct.  Hemostasis obtained with IV pitocin and fundal massage.    Anesthesia: Epidural Episiotomy: None Lacerations: None Suture Repair:  NA Est. Blood Loss (mL): 100  Mom to postpartum.  Baby to Couplet care / Skin to Skin.  Rod Can, CNM 07/06/2021, 4:23 PM

## 2021-05-27 ENCOUNTER — Ambulatory Visit: Payer: Managed Care, Other (non HMO) | Admitting: Dietician

## 2021-05-28 ENCOUNTER — Other Ambulatory Visit: Payer: Self-pay

## 2021-05-28 ENCOUNTER — Ambulatory Visit (INDEPENDENT_AMBULATORY_CARE_PROVIDER_SITE_OTHER): Payer: Managed Care, Other (non HMO) | Admitting: Obstetrics

## 2021-05-28 ENCOUNTER — Telehealth: Payer: Self-pay | Admitting: Dietician

## 2021-05-28 VITALS — BP 120/60 | Wt 202.0 lb

## 2021-05-28 DIAGNOSIS — O24410 Gestational diabetes mellitus in pregnancy, diet controlled: Secondary | ICD-10-CM | POA: Diagnosis not present

## 2021-05-28 DIAGNOSIS — O0990 Supervision of high risk pregnancy, unspecified, unspecified trimester: Secondary | ICD-10-CM | POA: Diagnosis not present

## 2021-05-28 DIAGNOSIS — O99213 Obesity complicating pregnancy, third trimester: Secondary | ICD-10-CM

## 2021-05-28 DIAGNOSIS — Z3A33 33 weeks gestation of pregnancy: Secondary | ICD-10-CM

## 2021-05-28 DIAGNOSIS — O09523 Supervision of elderly multigravida, third trimester: Secondary | ICD-10-CM

## 2021-05-28 NOTE — Progress Notes (Signed)
Routine Prenatal Care Visit  Subjective  Deborah Mccarthy is a 41 y.o. [id number] at [redacted]w[redacted]d being seen today for ongoing prenatal care.  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  ----------------------------------------------------------------------------------- Patient reports no complaints.  She is checking her glucose levels. NST today-  .  .   . Leaking Fluid denies.  ----------------------------------------------------------------------------------- The following portions of the patient's history were reviewed and updated as appropriate: allergies, current medications, past family history, past medical history, past social history, past surgical history and problem list. Problem list updated.  Objective  Blood pressure 120/60, weight 202 lb (91.6 kg), last menstrual period 08/25/2020. Pregravid weight 178 lb (80.7 kg) Total Weight Gain 24 lb (10.9 kg) Urinalysis: Urine Protein    Urine Glucose    Fetal Status:           General:  Alert, oriented and cooperative. Patient is in no acute distress.  Skin: Skin is warm and dry. No rash noted.   Cardiovascular: Normal heart rate noted  Respiratory: Normal respiratory effort, no problems with respiration noted  Abdomen: Soft, gravid, appropriate for gestational age.       Pelvic:  Cervical exam deferred        Extremities: Normal range of motion.     Mental Status: Normal mood and affect. Normal behavior. Normal judgment and thought content.   Assessment   41 y.o. [id number] at [redacted]w[redacted]d by  07/11/2021, by Ultrasound presenting for routine prenatal visit  Plan   FIFTH Problems (from 11/14/20 to present)    Problem Noted Resolved    Gestational diabetes 05/07/2021 by Rod Can, CNM No   Supervision of high risk pregnancy, antepartum 11/14/2020 by Will Bonnet, MD No   Overview Addendum 05/14/2021  3:13 PM by Allen Derry, CNM     Nursing Staff Provider  Office Location  [facility] Dating  [place]  Language  English Anatomy US  MFM  Flu Vaccine  02/02/21 Genetic Screen  NIPS: nml XY   TDaP vaccine    Hgb A1C or  GTT Early :116 Third trimester :   Covid    LAB RESULTS   Rhogam  n/a Blood Type A/Positive/-- (07/27 1009)   Feeding Plan Breast Antibody Negative (07/27 1009)  Contraception Nexpl or BTL If CS then BTL Rubella 5.32 (07/27 1009)  Circumcision undecided RPR Non Reactive (07/27 1009)   Pediatrician  Princella Ion HBsAg Negative (07/27 1009)   Support Person  HIV Non Reactive (07/27 1009)  Prenatal Classes multip Varicella Immune    GBS  (For PCN allergy, check sensitivities)   BTL Consent Not needed    VBAC Consent n/a Pap  02/18/20         Advanced maternal age in multigravida 11/14/2020 by Will Bonnet, MD No   Obesity affecting pregnancy 11/14/2020 by Will Bonnet, MD No     She is nervous about an induced labor. We talked a bit about how cervical ripening is handled, the use of pitocin, and options for pain control. Reassure her that she will have informed consent for any POC. Her FBSs ar ea tad higher , averaging around 100. Stressed the import of tight control, and for her to try to test 4 x daily. She will bring her values  next week.  Preterm labor symptoms and general obstetric precautions including but not limited to vaginal bleeding, contractions, leaking of fluid and fetal movement were reviewed in detail with the patient. Please refer to After Visit Summary for other counseling recommendations.   Return in about 1 week (around 06/04/2021) for return OB with NST. MD if possible.Imagene Riches, CNM  05/28/2021 2:59 PM

## 2021-05-28 NOTE — Telephone Encounter (Signed)
Called patient to reschedule her missed appointment from 05/27/20. Left voicemail message requesting a call back, offered appt for 05/29/20.

## 2021-05-28 NOTE — Progress Notes (Signed)
C/o was sick last week.

## 2021-06-04 ENCOUNTER — Encounter: Payer: Self-pay | Admitting: Licensed Practical Nurse

## 2021-06-04 ENCOUNTER — Ambulatory Visit (INDEPENDENT_AMBULATORY_CARE_PROVIDER_SITE_OTHER): Payer: Managed Care, Other (non HMO) | Admitting: Licensed Practical Nurse

## 2021-06-04 ENCOUNTER — Other Ambulatory Visit: Payer: Self-pay

## 2021-06-04 VITALS — BP 120/60 | Wt 202.0 lb

## 2021-06-04 DIAGNOSIS — O0990 Supervision of high risk pregnancy, unspecified, unspecified trimester: Secondary | ICD-10-CM

## 2021-06-04 DIAGNOSIS — Z3A34 34 weeks gestation of pregnancy: Secondary | ICD-10-CM | POA: Diagnosis not present

## 2021-06-04 DIAGNOSIS — O24415 Gestational diabetes mellitus in pregnancy, controlled by oral hypoglycemic drugs: Secondary | ICD-10-CM | POA: Diagnosis not present

## 2021-06-04 LAB — POCT URINALYSIS DIPSTICK OB
Glucose, UA: NEGATIVE
POC,PROTEIN,UA: NEGATIVE

## 2021-06-04 MED ORDER — METFORMIN HCL 500 MG PO TABS: 500.0000 mg | ORAL_TABLET | Freq: Every day | ORAL | 0 refills | Status: DC

## 2021-06-04 NOTE — Progress Notes (Signed)
Routine Prenatal Care Visit  Subjective  Deborah Mccarthy is a 41 y.o. [id number] at [redacted]w[redacted]d being seen today for ongoing prenatal care.  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  ----------------------------------------------------------------------------------- Patient reports no complaints.  Doing well, mood is good.  Reviewed blood glucose log. 7/13 elevated fasting's plus some elevated PP which equals about 30% abnormal readings total, discussed the need to add medication. Pt prefers to try oral medication first, understands she may need to transition to insulin.  Contractions: Not present. Vag. Bleeding: None.  Movement: Present. Leaking Fluid denies.  ----------------------------------------------------------------------------------- The following portions of the patient's history were reviewed and updated as appropriate: allergies, current medications, past family history, past medical history, past social history, past surgical history and problem list. Problem list updated.  Objective  Blood pressure 120/60, weight 202 lb (91.6 kg), last menstrual period 08/25/2020. Pregravid weight 178 lb (80.7 kg) Total Weight Gain 24 lb (10.9 kg) Urinalysis: Urine Protein Negative  Urine Glucose Negative  Fetal Status:     Movement: Present      RNST  Baseline 125, moderate variability, pos accel (15x15), neg decel  General:  Alert, oriented and cooperative. Patient is in no acute distress.  Skin: Skin is warm and dry. No rash noted.   Cardiovascular: Normal heart rate noted  Respiratory: Normal respiratory effort, no problems with respiration noted  Abdomen: Soft, gravid, appropriate for  gestational age. Pain/Pressure: Absent     Pelvic:  Cervical exam deferred        Extremities: Normal range of motion.     Mental Status: Normal mood and affect. Normal behavior. Normal judgment and thought content.   Assessment   41 y.o. [name] at [redacted]w[redacted]d by  07/11/2021, by Ultrasound presenting for routine prenatal visit GDM now on oral hypoglycemics   Plan   FIFTH Problems (from 11/14/20 to present)    Problem Noted Resolved   Gestational diabetes 05/07/2021 by Rod Can, CNM No   Supervision of high risk pregnancy, antepartum 11/14/2020 by Will Bonnet, MD No   Overview Addendum 05/14/2021  3:13 PM by Allen Derry, [place] Staff Provider  Office Location  [facility] Dating  [place]  Language  English Anatomy US  MFM  Flu Vaccine  02/02/21 Genetic Screen  NIPS: nml XY   TDaP vaccine    Hgb A1C or  GTT Early :116 Third trimester :   Covid    LAB RESULTS   Rhogam  n/a Blood Type A/Positive/-- (07/27 1009)   Feeding Plan Breast Antibody Negative (07/27 1009)  Contraception Nexpl or BTL If CS then BTL Rubella 5.32 (07/27 1009)  Circumcision undecided RPR Non Reactive (07/27 1009)   Pediatrician  Princella Ion HBsAg Negative (07/27 1009)   Support Person Ryan  HIV Non Reactive (07/27 1009)  Prenatal Classes multip Varicella Immune    GBS  (For PCN allergy, check sensitivities)   BTL Consent Not needed    VBAC Consent n/a Pap  02/18/20         Advanced maternal age in multigravida 11/14/2020 by Will Bonnet, MD No   Obesity affecting pregnancy 11/14/2020 by Will Bonnet, MD No       Preterm labor symptoms and general obstetric precautions including  but not limited to vaginal bleeding, contractions, leaking of fluid and fetal movement were reviewed in detail with the patient. Please refer to After Visit Summary for other counseling recommendations.   Has Weekly BPP scheduled 36 week slabs next visit  Metformin 500mg  PO q HS  Return in about 1  week (around 06/11/2021) for w/ OB GDM management .  Roberto Scales, CNM  [facility], [facility]  06/04/21  2:33 PM

## 2021-06-10 ENCOUNTER — Ambulatory Visit: Payer: Managed Care, Other (non HMO) | Admitting: *Deleted

## 2021-06-10 ENCOUNTER — Ambulatory Visit: Payer: Managed Care, Other (non HMO) | Attending: Obstetrics and Gynecology

## 2021-06-10 ENCOUNTER — Other Ambulatory Visit: Payer: Self-pay

## 2021-06-10 ENCOUNTER — Encounter: Payer: Self-pay | Admitting: *Deleted

## 2021-06-10 VITALS — BP 120/72 | HR 82

## 2021-06-10 DIAGNOSIS — O09523 Supervision of elderly multigravida, third trimester: Secondary | ICD-10-CM | POA: Insufficient documentation

## 2021-06-10 DIAGNOSIS — O0990 Supervision of high risk pregnancy, unspecified, unspecified trimester: Secondary | ICD-10-CM | POA: Diagnosis present

## 2021-06-10 DIAGNOSIS — O99213 Obesity complicating pregnancy, third trimester: Secondary | ICD-10-CM | POA: Diagnosis not present

## 2021-06-10 DIAGNOSIS — Z6834 Body mass index (BMI) 34.0-34.9, adult: Secondary | ICD-10-CM | POA: Diagnosis present

## 2021-06-10 DIAGNOSIS — E669 Obesity, unspecified: Secondary | ICD-10-CM | POA: Diagnosis not present

## 2021-06-10 DIAGNOSIS — O24410 Gestational diabetes mellitus in pregnancy, diet controlled: Secondary | ICD-10-CM | POA: Insufficient documentation

## 2021-06-10 DIAGNOSIS — Z3A35 35 weeks gestation of pregnancy: Secondary | ICD-10-CM

## 2021-06-12 ENCOUNTER — Encounter: Payer: Self-pay | Admitting: Licensed Practical Nurse

## 2021-06-12 ENCOUNTER — Other Ambulatory Visit: Payer: Self-pay

## 2021-06-12 ENCOUNTER — Ambulatory Visit (INDEPENDENT_AMBULATORY_CARE_PROVIDER_SITE_OTHER): Payer: Managed Care, Other (non HMO) | Admitting: Licensed Practical Nurse

## 2021-06-12 VITALS — BP 126/84 | Wt 200.0 lb

## 2021-06-12 DIAGNOSIS — O24415 Gestational diabetes mellitus in pregnancy, controlled by oral hypoglycemic drugs: Secondary | ICD-10-CM

## 2021-06-12 DIAGNOSIS — O0990 Supervision of high risk pregnancy, unspecified, unspecified trimester: Secondary | ICD-10-CM

## 2021-06-12 DIAGNOSIS — Z3A35 35 weeks gestation of pregnancy: Secondary | ICD-10-CM

## 2021-06-12 DIAGNOSIS — Z3685 Encounter for antenatal screening for Streptococcus B: Secondary | ICD-10-CM

## 2021-06-12 DIAGNOSIS — Z1152 Encounter for screening for COVID-19: Secondary | ICD-10-CM

## 2021-06-12 LAB — FETAL NONSTRESS TEST

## 2021-06-12 NOTE — H&P (Signed)
OB History & Physical  Preadmission H and P   History of Present Illness:  Chief Complaint:   HPI:  Deborah Mccarthy is a 41 y.o. [id number] female at 80w6ddated by 7wk [place]  She presents to L&D for an induction of labor secondary to GDM. She had early and regular prenatal care. She was started on Metformin at 34wks5days. Blood glucose has been well controlled since. Third trimester fetal surveillance has been reassuring.  +FM, no CTX, no LOF, no VB  Pregnancy Issues: 1. GDM on oral meds 2. AMA 3. EFW 2285 grams, 11% at 35wks4d  Maternal Medical History:   Past Medical History:  Diagnosis Date   Abnormal Pap smear of vagina    BV (bacterial vaginosis)    Cervical dysplasia    Chlamydia    Depression    Dysmenorrhea    Gestational diabetes    Hives    Menorrhagia    Ovarian cyst    Rectal ulcer    Negative for Crohn's   Vitamin D deficiency     Past Surgical History:  Procedure Laterality Date   CRYOTHERAPY     FINGER SURGERY     PILONIDAL CYST EXCISION     WISDOM TOOTH EXTRACTION      No Known Allergies  Prior to Admission medications   Medication Sig Start Date End Date Taking? Authorizing Provider  Accu-Chek Softclix Lancets lancets Use as instructed 4 times daily 05/03/21   HGae Dry MD  Blood Glucose Monitoring Suppl (ACCU-CHEK GUIDE) w/Device KIT Use as directed, 4 times daily 05/05/21   HGae Dry MD  glucose blood test strip Use as instructed 4 times daily 05/03/21   HGae Dry MD  metFORMIN (GLUCOPHAGE) 500 MG tablet Take 1 tablet (500 mg total) by mouth at bedtime. 06/04/21   Delainy Mcelhiney, LNunzio Cobbs CNM  Prenatal Vit-Fe Fumarate-FA (MULTIVITAMIN-PRENATAL) 27-0.8 MG TABS tablet Take 1 tablet by mouth daily at 12 noon.    [provider]     Prenatal care site: [facility]   Social History: She  reports that she quit smoking about 7 months ago. Her smoking use included cigarettes. She has a 15.00 pack-year smoking history. She  has never used smokeless tobacco. She reports that she does not drink alcohol and does not use drugs.  Family History: family history includes Breast cancer (age of onset: 59 in her maternal grandmother; Colon cancer in her maternal grandfather and another family member; Coronary artery disease in her father and maternal grandmother; Diabetes in her father; GBerenice Primas disease in her mother.   Review of Systems: A full review of systems was performed and negative except as noted in the HPI.     Physical Exam:  Vital Signs: BP 126/84    Wt 200 lb (90.7 kg)    LMP 08/25/2020 (Approximate)    BMI 37.79 kg/m  General: no acute distress.  HEENT: normocephalic, atraumatic Heart: regular rate & rhythm.  No murmurs/rubs/gallops Lungs: clear to auscultation bilaterally, normal respiratory effort Abdomen: soft, gravid, non-tender;  EFW:5.5 6lbs  Pelvic:   External: Normal external female genitalia  Cervix: not assessed    Extremities: non-tender, symmetric, No edema bilaterally.   Neurologic: Alert & oriented x 3.    No results found for this or any previous visit (from the past 24 hour(s)).  Pertinent Results:  Prenatal Labs: Blood type/Rh A pos  Antibody screen neg  Rubella Immune  Varicella Immune  RPR NR  HBsAg Neg  HIV NR  GC neg  Chlamydia neg  Genetic screening negative  1 hour GTT   3 hour GTT Abnormal   GBS    Cephalic by leopolds  [place] MFM FETAL BPP WO NON STRESS  Result Date: 06/10/2021 ----------------------------------------------------------------------  OBSTETRICS REPORT                       (Signed Final 06/10/2021 05:01 pm) ---------------------------------------------------------------------- Patient Info  ID #:       [id number]                          D.O.B.:  [birth year] (40 yrs)  Name:       Deborah Mccarthy                 Visit Date: 06/10/2021 02:51 pm ---------------------------------------------------------------------- Performed By  Attending:        Sander Nephew      Referred By:      Jola Babinski OB/GYN                    MD  Performed By:     Germain Osgood            [facility]                                     [facility] at                                                             [place] for                                                             Women ---------------------------------------------------------------------- Orders  #  Description                           Code        Ordered By  1  [place] MFM OB FOLLOW UP                   76816.01    RAVI SHANKAR  2  [place] MFM FETAL BPP WO NON               76819.01    [place] ----------------------------------------------------------------------  #  Order #                     Accession #                Episode #  1  [id number]                   [id number]  [id number]  2  [id number]                   [id number]                 [id number] ---------------------------------------------------------------------- Indications  Gestational diabetes in pregnancy,             O24.415  controlled by oral hypoglycemic drugs  Advanced maternal age multigravida 27+,        O51.523  third trimester  Obesity complicating pregnancy, third          O99.213  trimester  Negative MaterniT21  [redacted] weeks gestation of pregnancy                Z3A.35 ---------------------------------------------------------------------- Fetal Evaluation  Num Of Fetuses:         1  Fetal Heart Rate(bpm):  127  Cardiac Activity:       Observed  Presentation:           Cephalic  Placenta:               Right lateral  P. Cord Insertion:      Previously Visualized  Amniotic Fluid  AFI FV:      Within normal limits  AFI Sum(cm)     %Tile       Largest Pocket(cm)  12.37           39          5.71  RUQ(cm)       RLQ(cm)       LUQ(cm)        LLQ(cm)  5.71          1.55          3.41           1.7 ---------------------------------------------------------------------- Biophysical Evaluation   Amniotic F.V:   Pocket => 2 cm             F. Tone:        Observed  F. Movement:    Observed                   Score:          8/8  F. Breathing:   Observed ---------------------------------------------------------------------- Biometry  BPD:      84.5  mm     G. Age:  34w 0d         15  %    CI:         72.2   %    70 - 86                                                          FL/HC:      19.8   %    20.1 - 22.1  HC:      316.4  mm     G. Age:  35w 4d         18  %    HC/AC:      1.04        0.93 - 1.11  AC:      302.8  mm     G. Age:  34w 2d         21  %    FL/BPD:  74.3   %    71 - 87  FL:       62.8  mm     G. Age:  32w 4d          1  %    FL/AC:      20.7   %    20 - 24  HUM:      58.8  mm     G. Age:  34w 0d         37  %  LV:          1  mm  Est. FW:    2295  gm      5 lb 1 oz     11  % ---------------------------------------------------------------------- OB History  Gravidity:    5         Term:   2        Prem:   0        SAB:   1  TOP:          1       Ectopic:  0        Living: 2 ---------------------------------------------------------------------- Gestational Age  U/S Today:     34w 1d                                        EDD:   07/21/21  Best:          35w 4d     Det. ByLoman Chroman         EDD:   07/11/21                                      (11/28/20) ---------------------------------------------------------------------- Anatomy  Cranium:               Appears normal         Aortic Arch:            Previously seen  Cavum:                 Previously seen        Ductal Arch:            Previously seen  Ventricles:            Appears normal         Diaphragm:              Appears normal  Choroid Plexus:        Previously seen        Stomach:                Appears normal, left                                                                        sided  Cerebellum:            Previously seen        Abdomen:  Previously seen  Posterior Fossa:       Previously seen         Abdominal Wall:         Previously seen  Nuchal Fold:           Previously seen        Cord Vessels:           Previously seen  Face:                  Orbits and profile     Kidneys:                Appear normal                         previously seen  Lips:                  Previously seen        Bladder:                Appears normal  Thoracic:              Previously seen        Spine:                  Previously seen  Heart:                 Previously seen        Upper Extremities:      Previously seen  RVOT:                  Previously seen        Lower Extremities:      Previously seen  LVOT:                  Previously seen  Other:  Female gender previously seen.Technically difficult due to fetal position. ---------------------------------------------------------------------- Doppler - Fetal Vessels  Umbilical Artery   S/D     %tile      RI    %tile      PI    %tile            ADFV    RDFV   2.23       36    0.55       38    0.76       34               No      No ---------------------------------------------------------------------- Cervix Uterus Adnexa  Cervix  Not visualized (advanced GA >24wks) ---------------------------------------------------------------------- Impression  Follow up growth due to A2GDM  Normal interval growth with measurements consistent with  dates  Good fetal movement and amniotic fluid volume  Biophysical profile 8/8 ---------------------------------------------------------------------- Recommendations  Continue weekly testing  Delivery between 37-39 weeks pending maternal and fetal  status. ----------------------------------------------------------------------               Sander Nephew, MD Electronically Signed Final Report   06/10/2021 05:01 pm ----------------------------------------------------------------------  [place] MFM OB FOLLOW UP  Result Date: 06/10/2021 ----------------------------------------------------------------------  OBSTETRICS REPORT                        (Signed Final 06/10/2021 05:01 pm) ---------------------------------------------------------------------- Patient Info  ID #:       [id number]  D.O.B.:  July 07, 1980 (40 yrs)  Name:       Deborah Mccarthy                 Visit Date: 06/10/2021 02:51 pm ---------------------------------------------------------------------- Performed By  Attending:        Sander Nephew      Referred By:      Jola Babinski OB/GYN                    MD  Performed By:     Germain Osgood            [facility]                                     [facility] at                                                             [place] for                                                             Women ---------------------------------------------------------------------- Orders  #  Description                           Code        Ordered By  1  [place] MFM OB FOLLOW UP                   76816.01    RAVI SHANKAR  2  [place] MFM FETAL BPP WO NON               76819.01    RAVI [facility]     STRESS ----------------------------------------------------------------------  #  Order #                     Accession #                Episode #  1  [id number]                   [id number]                 [id number]  2  [id number]                   [id number]                 [id number] ---------------------------------------------------------------------- Indications  Gestational diabetes in pregnancy,             O24.415  controlled by oral hypoglycemic drugs  Advanced maternal age multigravida 76+,        O47.523  third trimester  Obesity complicating pregnancy, third          O99.213  trimester  Negative MaterniT21  [redacted] weeks gestation of pregnancy  Z3A.35 ---------------------------------------------------------------------- Fetal Evaluation  Num Of Fetuses:         1  Fetal Heart Rate(bpm):  127  Cardiac Activity:       Observed  Presentation:           Cephalic  Placenta:               Right  lateral  P. Cord Insertion:      Previously Visualized  Amniotic Fluid  AFI FV:      Within normal limits  AFI Sum(cm)     %Tile       Largest Pocket(cm)  12.37           39          5.71  RUQ(cm)       RLQ(cm)       LUQ(cm)        LLQ(cm)  5.71          1.55          3.41           1.7 ---------------------------------------------------------------------- Biophysical Evaluation  Amniotic F.V:   Pocket => 2 cm             F. Tone:        Observed  F. Movement:    Observed                   Score:          8/8  F. Breathing:   Observed ---------------------------------------------------------------------- Biometry  BPD:      84.5  mm     G. Age:  34w 0d         15  %    CI:         72.2   %    70 - 86                                                          FL/HC:      19.8   %    20.1 - 22.1  HC:      316.4  mm     G. Age:  35w 4d         18  %    HC/AC:      1.04        0.93 - 1.11  AC:      302.8  mm     G. Age:  34w 2d         21  %    FL/BPD:     74.3   %    71 - 87  FL:       62.8  mm     G. Age:  32w 4d          1  %    FL/AC:      20.7   %    20 - 24  HUM:      58.8  mm     G. Age:  34w 0d         37  %  LV:          1  mm  Est. FW:    2295  gm      5 lb 1 oz  11  % ---------------------------------------------------------------------- OB History  Gravidity:    5         Term:   2        Prem:   0        SAB:   1  TOP:          1       Ectopic:  0        Living: 2 ---------------------------------------------------------------------- Gestational Age  U/S Today:     34w 1d                                        EDD:   07/21/21  Best:          35w 4d     Det. ByLoman Chroman         EDD:   07/11/21                                      (11/28/20) ---------------------------------------------------------------------- Anatomy  Cranium:               Appears normal         Aortic Arch:            Previously seen  Cavum:                 Previously seen        Ductal Arch:            Previously seen   Ventricles:            Appears normal         Diaphragm:              Appears normal  Choroid Plexus:        Previously seen        Stomach:                Appears normal, left                                                                        sided  Cerebellum:            Previously seen        Abdomen:                Previously seen  Posterior Fossa:       Previously seen        Abdominal Wall:         Previously seen  Nuchal Fold:           Previously seen        Cord Vessels:           Previously seen  Face:                  Orbits and profile     Kidneys:                Appear normal  previously seen  Lips:                  Previously seen        Bladder:                Appears normal  Thoracic:              Previously seen        Spine:                  Previously seen  Heart:                 Previously seen        Upper Extremities:      Previously seen  RVOT:                  Previously seen        Lower Extremities:      Previously seen  LVOT:                  Previously seen  Other:  Female gender previously seen.Technically difficult due to fetal position. ---------------------------------------------------------------------- Doppler - Fetal Vessels  Umbilical Artery   S/D     %tile      RI    %tile      PI    %tile            ADFV    RDFV   2.23       36    0.55       38    0.76       34               No      No ---------------------------------------------------------------------- Cervix Uterus Adnexa  Cervix  Not visualized (advanced GA >24wks) ---------------------------------------------------------------------- Impression  Follow up growth due to A2GDM  Normal interval growth with measurements consistent with  dates  Good fetal movement and amniotic fluid volume  Biophysical profile 8/8 ---------------------------------------------------------------------- Recommendations  Continue weekly testing  Delivery between 37-39 weeks pending maternal and fetal  status.  ----------------------------------------------------------------------               Sander Nephew, MD Electronically Signed Final Report   06/10/2021 05:01 pm ----------------------------------------------------------------------  [place] MFM OB FOLLOW UP  Result Date: 05/13/2021 ----------------------------------------------------------------------  OBSTETRICS REPORT                       (Signed Final 05/13/2021 05:41 pm) ---------------------------------------------------------------------- Patient Info  ID #:       [id number]                          D.O.B.:  Oct 17, 1980 (40 yrs)  Name:       Deborah [facility]                 Visit Date: 05/13/2021 03:33 pm ---------------------------------------------------------------------- Performed By  Attending:        Tama High MD        Referred By:      Jola Babinski OB/GYN  Performed By:     Germain Osgood            [facility]  [facility] at                                                             [facility] for                                                             Women ---------------------------------------------------------------------- Orders  #  Description                           Code        Ordered By  1  [place] MFM OB FOLLOW UP                   [phone]    MARGARET FRYER ----------------------------------------------------------------------  #  Order #                     Accession #                Episode #  1  [id number]                   [id number]                 [id number] ---------------------------------------------------------------------- Indications  Gestational diabetes in pregnancy, diet        O24.410  controlled  Advanced maternal age multigravida 41+,        O38.523  third trimester  Obesity complicating pregnancy, third          O99.213  trimester  [redacted] weeks gestation of pregnancy                Z3A.31  Negative MaterniT21  ---------------------------------------------------------------------- Fetal Evaluation  Num Of Fetuses:         1  Fetal Heart Rate(bpm):  148  Cardiac Activity:       Observed  Presentation:           Cephalic  Placenta:               Right lateral  P. Cord Insertion:      Previously Visualized  Amniotic Fluid  AFI FV:      Within normal limits  AFI Sum(cm)     %Tile       Largest Pocket(cm)  12.22           33          4.31  RUQ(cm)       RLQ(cm)       LUQ(cm)        LLQ(cm)  1.8           2.19          3.92           4.31 ---------------------------------------------------------------------- Biometry  BPD:      78.5  mm     G. Age:  31w 4d         38  %    CI:        68.56   %    70 -  86                                                          FL/HC:      19.4   %    19.1 - 21.3  HC:       303   mm     G. Age:  33w 5d         71  %    HC/AC:      1.09        0.96 - 1.17  AC:      277.6  mm     G. Age:  31w 6d         55  %    FL/BPD:     75.0   %    71 - 87  FL:       58.9  mm     G. Age:  30w 5d         17  %    FL/AC:      21.2   %    20 - 24  LV:        2.3  mm  Est. FW:    1808  gm           4 lb     40  % ---------------------------------------------------------------------- OB History  Gravidity:    5         Term:   2        Prem:   0        SAB:   1  TOP:          1       Ectopic:  0        Living: 2 ---------------------------------------------------------------------- Gestational Age  U/S Today:     32w 0d                                        EDD:   07/08/21  Best:          31w 4d     Det. By:  Loman Chroman         EDD:   07/11/21                                      (11/28/20) ---------------------------------------------------------------------- Anatomy  Cranium:               Appears normal         Aortic Arch:            Previously seen  Cavum:                 Previously seen        Ductal Arch:            Previously seen  Ventricles:            Appears normal         Diaphragm:               Appears normal  Choroid Plexus:        Previously  seen        Stomach:                Appears normal, left                                                                        sided  Cerebellum:            Previously seen        Abdomen:                Previously seen  Posterior Fossa:       Previously seen        Abdominal Wall:         Previously seen  Nuchal Fold:           Previously seen        Cord Vessels:           Previously seen  Face:                  Orbits and profile     Kidneys:                Appear normal                         previously seen  Lips:                  Previously seen        Bladder:                Appears normal  Thoracic:              Previously seen        Spine:                  Appears normal  Heart:                 Previously seen        Upper Extremities:      Previously seen  RVOT:                  Previously seen        Lower Extremities:      Previously seen  LVOT:                  Previously seen  Other:  Female gender previously seen.Technically difficult due to fetal position. ---------------------------------------------------------------------- Cervix Uterus Adnexa  Cervix  Not visualized (advanced GA >24wks) ---------------------------------------------------------------------- Impression  Fetal growth is appropriate for gestational age.  Amniotic fluid  is normal and good fetal activity seen.  xxxxxxxxxxxxxxxxxxxxxxxxxxxxxxxxxxxxxxxxxxxxxxxxxx  Consultation (see EPIC )  I had the pleasure of seeing Deborah [facility] today at the [place]  [facility]. She is G5 P2 at 31w 4d gestation  and is here for ultrasound evaluation.  She has a recent diagnosis of gestational diabetes.  Patient  has recently started checking her blood glucose and reports  they are within normal range.  She does not have hypertension.  Blood pressure today at  her office is 111/64 mmHg.  Advanced maternal age.  On cell free fetal DNA screening,  the risks of fetal aneuploidies are not  increased.  Obstetric history significant for 2 term vaginal deliveries.  Gestational diabetes  I explained the diagnosis of gestational diabetes.  I  emphasized the importance of good blood glucose control to  prevent adverse fetal or neonatal outcomes.  I discussed  blood glucose normal values. I encouraged her to check her  blood glucose regularly.  Possible complications of gestational diabetes include fetal  macrosomia, shoulder dystocia and birth injuries, stillbirth (in  poorly controlled diabetes) and neonatal respiratory  syndrome and other complications.  In about 85% of cases, gestational diabetes is well controlled  by diet alone.  Exercise reduces the need for insulin.  Medical  treatment includes oral hypoglycemics or insulin.  If patient requires insulin or oral hypoglycemics, we  recommend weekly antenatal testing from 32 weeks'  gestation till delivery.  Timing of delivery: In well-controlled diabetes on diet, patient  can be delivered at 39 weeks' gestation. Vaginal delivery is  not contraindicated.  Type 2 diabetes develops in about 25% to 40% of women  with GDM. I recommend postpartum screening with 75-g  glucose load at 6 to 12 weeks after delivery.  Advanced maternal age (>40 years)  A small increased risk of stillbirth is reported with advanced  paternal age >40 years.  I recommend weekly BPP from [redacted]  weeks gestation till delivery.  Delivery should be considered  at [redacted] weeks gestation. ---------------------------------------------------------------------- Recommendations  -An appointment was made for her to return in 4 weeks for  fetal growth assessment and BPP.  Weekly BPP from her next visit till delivery.  -If patient requires oral hypoglycemics or insulin, we  recommend weekly BPP till delivery. ----------------------------------------------------------------------                  Tama High, MD Electronically Signed Final Report   05/13/2021 05:41 pm  ----------------------------------------------------------------------   Assessment:  Deborah M[facility] is a 41 y.o. [id number] female at 31w6dwith induction of labor.   Plan:  Admit to Labor & Delivery CBC, T&S, Clrs, IVF GBS   Consents obtained. Continuous efm/toco Induction method based on exam on admission  Desires PP tubal   ----- [place]Roberto Scales [place]

## 2021-06-12 NOTE — Progress Notes (Signed)
Routine Prenatal Care Visit  Subjective  Deborah Mccarthy is a 41 y.o. H6D1497 at [redacted]w[redacted]d being seen today for ongoing prenatal care.  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  ----------------------------------------------------------------------------------- Patient reports no complaints.  Discussed rec by MFM to induce between 37-39 weeks, d/t GDM and fetus in the 11%, reviewed this is not growth restriction.  Desires IOL on 2/13.  Anticipatory guidance given regarding day of induction.  -Started on Metformin 500mg , only 4/9 fasting's elevated, all PP WNL.  -Desires PP tubal   Contractions: Not present. Vag. Bleeding: None.  Movement: Present. Leaking Fluid denies.  ----------------------------------------------------------------------------------- The following portions of the patient's history were reviewed and updated as appropriate: allergies, current medications, past family history, past medical history, past social history, past surgical history and problem list. Problem list updated.  Objective  Blood pressure 126/84, weight 200 lb (90.7 kg), last menstrual period 08/25/2020. Pregravid weight 178 lb (80.7 kg) Total Weight Gain 22 lb (9.979 kg) Urinalysis: Urine Protein    Urine Glucose    Fetal Status: Fetal Heart Rate (bpm): RNST   Movement: Present     Baseline 125, moderate variability, pos accel (15x15) neg decel   General:  Alert, oriented and cooperative. Patient is in no acute distress.  Skin: Skin is warm and dry. No rash noted.   Cardiovascular: Normal heart rate noted  Respiratory: Normal respiratory effort, no problems with respiration noted  Abdomen: Soft, gravid,  appropriate for gestational age. Pain/Pressure: Absent     Pelvic:  Cervical exam deferred        Extremities: Normal range of motion.  Edema: None  Mental Status: Normal mood and affect. Normal behavior. Normal judgment and thought content.   Assessment   41 y.o. [id number] at [redacted]w[redacted]d by  07/11/2021, by Ultrasound presenting for routine prenatal visit GDM on oral meds  Plan   June 2022 Problems (from 11/14/20 to present)     Problem Noted Resolved   Gestational diabetes 05/07/2021 by Rod Can, CNM No   Overview Signed 06/04/2021  2:35 PM by Allen Derry, CNM    Started on Metformin 500mg  at bedtime on 06/04/21      Supervision of high risk pregnancy, antepartum 11/14/2020 by Will Bonnet, MD No   Overview Addendum 06/04/2021  2:34 PM by Allen Derry, [place] Staff Provider  Office Location  [facility] Dating  [place]  Language  English Anatomy US  MFM  Flu Vaccine  02/02/21 Genetic Screen  NIPS: nml XY   TDaP vaccine    Hgb A1C or  GTT Early :116 Third trimester :   Covid Vaccinated    LAB RESULTS   Rhogam  n/a Blood Type A/Positive/-- (07/27 1009)   Feeding Plan Breast Antibody Negative (07/27 1009)  Contraception Nexpl or BTL If CS then BTL Rubella 5.32 (07/27 1009)  Circumcision undecided RPR Non Reactive (07/27 1009)   Pediatrician  Princella Ion HBsAg Negative (07/27 1009)   Support Person Ryan  HIV Non Reactive (07/27 1009)  Prenatal Classes multip Varicella Immune    GBS  (For PCN allergy, check sensitivities)   BTL Consent Not needed    VBAC Consent n/a Pap  02/18/20  Advanced maternal age in multigravida 11/14/2020 by Will Bonnet, MD No   Obesity affecting pregnancy 11/14/2020 by Will Bonnet, MD No        Preterm labor symptoms and general obstetric precautions including but not limited to vaginal bleeding, contractions, leaking of fluid and fetal movement were reviewed in detail with the patient. Please refer to After  Visit Summary for other counseling recommendations.   Return in about 1 week (around 06/19/2021) for ROB, NST.  IOL scheduled 2/13 at 0800  Continue Metformin 500mg  daily   Roberto Scales, [place], [facility]  06/12/21  10:27 AM

## 2021-06-12 NOTE — Progress Notes (Signed)
°  [facility] INDUCTION ASSESSMENT SCHEDULING Deborah Mccarthy Jul 30, 1980 Medical record #: [id number] Phone #:  Home Phone [phone]  Mobile [phone]    Prenatal Provider:[facility] Delivering Group:[facility] Proposed admission date/time:07/06/21/0800 Method of induction:Pitocin  Weight: Filed Weights01/20/23 0936Weight:200 lb (90.7 kg) BMI Body mass index is 37.79 kg/m. HIV Negative HSV Negative EDC Estimated Date of Delivery: 2/18/23based on:US at [redacted] wks  Gestational age on admission: 39.3 Gravidity/parity:G5P2022  Cervix Score   0 1 2 3   Position Posterior Midposition Anterior   Consistency Firm Medium Soft   Effacement (%) 0-30 40-50 60-70 >80  Dilation (cm) Closed 1-2 3-4 >5  Babys station -3 -2 -1 +1, +2   Bishop Score: unknown   Medical induction of labor    Medical Indications Adapted from [place] #[id number], Medically Indicated Late Preterm and Early Term Deliveries, 2013.  PLACENTAL / UTERINE ISSUES FETAL ISSUES MATERNAL ISSUES  ?  ?  ?   ?  ?  ?   ?  ?  ?   ?  ?  ?   ?  ?  ?   ?  ?  ?   OBSTETRIC ISSUES ?  ?   ?  ?  ?   ?  ?  ?   ?  ?  XGestational, med controlled (39.0)  ?  ?  ?   ?   For indications not listed above, delivery recommendations from maternal-fetal medicine consultant occurred on: Date: w  Provider Signature: Livingston Wheeler Scheduled by:L and D  Date:06/12/2021 10:11 AM   Call [phone] to finalize the induction date/time  [id number] (07/17)

## 2021-06-12 NOTE — Progress Notes (Signed)
NST today. No vb . No lof.

## 2021-06-16 ENCOUNTER — Ambulatory Visit: Payer: Managed Care, Other (non HMO) | Admitting: *Deleted

## 2021-06-16 ENCOUNTER — Ambulatory Visit: Payer: Managed Care, Other (non HMO) | Attending: Obstetrics and Gynecology

## 2021-06-16 ENCOUNTER — Encounter: Payer: Self-pay | Admitting: *Deleted

## 2021-06-16 ENCOUNTER — Other Ambulatory Visit: Payer: Self-pay

## 2021-06-16 VITALS — BP 127/72 | HR 85

## 2021-06-16 DIAGNOSIS — O0990 Supervision of high risk pregnancy, unspecified, unspecified trimester: Secondary | ICD-10-CM

## 2021-06-16 DIAGNOSIS — O24415 Gestational diabetes mellitus in pregnancy, controlled by oral hypoglycemic drugs: Secondary | ICD-10-CM

## 2021-06-16 DIAGNOSIS — Z3A36 36 weeks gestation of pregnancy: Secondary | ICD-10-CM

## 2021-06-16 DIAGNOSIS — O09523 Supervision of elderly multigravida, third trimester: Secondary | ICD-10-CM

## 2021-06-16 DIAGNOSIS — E668 Other obesity: Secondary | ICD-10-CM | POA: Diagnosis not present

## 2021-06-16 DIAGNOSIS — O24410 Gestational diabetes mellitus in pregnancy, diet controlled: Secondary | ICD-10-CM | POA: Diagnosis present

## 2021-06-16 DIAGNOSIS — O99213 Obesity complicating pregnancy, third trimester: Secondary | ICD-10-CM | POA: Diagnosis present

## 2021-06-16 LAB — CULTURE, BETA STREP (GROUP B ONLY): Strep Gp B Culture: NEGATIVE

## 2021-06-17 ENCOUNTER — Other Ambulatory Visit: Payer: Self-pay | Admitting: *Deleted

## 2021-06-17 DIAGNOSIS — O09523 Supervision of elderly multigravida, third trimester: Secondary | ICD-10-CM

## 2021-06-17 DIAGNOSIS — O24415 Gestational diabetes mellitus in pregnancy, controlled by oral hypoglycemic drugs: Secondary | ICD-10-CM

## 2021-06-17 DIAGNOSIS — Z6833 Body mass index (BMI) 33.0-33.9, adult: Secondary | ICD-10-CM

## 2021-06-19 ENCOUNTER — Encounter: Payer: Self-pay | Admitting: Obstetrics & Gynecology

## 2021-06-19 ENCOUNTER — Ambulatory Visit (INDEPENDENT_AMBULATORY_CARE_PROVIDER_SITE_OTHER): Payer: Managed Care, Other (non HMO) | Admitting: Obstetrics & Gynecology

## 2021-06-19 ENCOUNTER — Other Ambulatory Visit: Payer: Self-pay

## 2021-06-19 VITALS — BP 120/70 | Wt 200.0 lb

## 2021-06-19 DIAGNOSIS — Z23 Encounter for immunization: Secondary | ICD-10-CM

## 2021-06-19 DIAGNOSIS — Z3685 Encounter for antenatal screening for Streptococcus B: Secondary | ICD-10-CM

## 2021-06-19 DIAGNOSIS — O24415 Gestational diabetes mellitus in pregnancy, controlled by oral hypoglycemic drugs: Secondary | ICD-10-CM | POA: Diagnosis not present

## 2021-06-19 DIAGNOSIS — O0990 Supervision of high risk pregnancy, unspecified, unspecified trimester: Secondary | ICD-10-CM

## 2021-06-19 DIAGNOSIS — Z3A36 36 weeks gestation of pregnancy: Secondary | ICD-10-CM

## 2021-06-19 DIAGNOSIS — O09523 Supervision of elderly multigravida, third trimester: Secondary | ICD-10-CM

## 2021-06-19 NOTE — Patient Instructions (Signed)

## 2021-06-19 NOTE — Addendum Note (Signed)
Addended by: Quintella Baton D on: 06/19/2021 02:49 PM   Modules accepted: Orders

## 2021-06-19 NOTE — Progress Notes (Signed)
°  Subjective  Fetal Movement? yes Contractions? no Leaking Fluid? no Vaginal Bleeding? no BS log >50%nml  Objective  BP 120/70    Wt 200 lb (90.7 kg)    LMP 08/25/2020 (Approximate)    BMI 37.79 kg/m  General: NAD Pumonary: no increased work of breathing Abdomen: gravid, non-tender Extremities: no edema Psychiatric: mood appropriate, affect full  A NST procedure was performed with FHR monitoring and a normal baseline established, appropriate time of 20-40 minutes of evaluation, and accels >2 seen w 15x15 characteristics.  Results show a REACTIVE NST.   Assessment  41 y.o. [id number] at [redacted]w[redacted]d by  07/11/2021, by Ultrasound presenting for routine prenatal visit  Plan   Problem List Items Addressed This Visit      Endocrine   Gestational diabetes     Other   Supervision of high risk pregnancy, antepartum - Primary   Advanced maternal age in multigravida  Other Visit Diagnoses    [redacted] weeks gestation of pregnancy       Encounter for antenatal screening for Streptococcus B        Labor precautions discussed.  Also discussed IOL. NST weekly and BPP weekly w MFM Cont Metformin and daily FSBS monitoring TDaP today as is due this pregnancy  June 2022 Problems (from 11/14/20 to present)    Problem Noted Resolved   Gestational diabetes 05/07/2021 by Rod Can, CNM No   Overview Signed 06/04/2021  2:35 PM by Allen Derry, CNM    Started on Metformin 500mg  at bedtime on 06/04/21      Supervision of high risk pregnancy, antepartum 11/14/2020 by Will Bonnet, MD No   Overview Addendum 06/19/2021  2:41 PM by Gae Dry, MD     Nursing Staff Provider  Office Location  [facility] Dating  [place]  Language  English Anatomy US  MFM  Flu Vaccine  02/02/21 Genetic Screen  NIPS: nml XY   TDaP vaccine   06/19/21 Hgb A1C or  GTT Early :116 Third trimester :   Covid Vaccinated    LAB RESULTS   Rhogam  n/a Blood Type A/Positive/-- (07/27 1009)   Feeding Plan Breast Antibody  Negative (07/27 1009)  Contraception Nexpl or BTL If CS then BTL Rubella 5.32 (07/27 1009)  Circumcision undecided RPR Non Reactive (07/27 1009)   Pediatrician  Princella Ion HBsAg Negative (07/27 1009)   Support Person Ryan  HIV Non Reactive (07/27 1009)  Prenatal Classes multip Varicella Immune    GBS NEG  BTL Consent Not needed    VBAC Consent n/a Pap  02/18/20         Advanced maternal age in multigravida 11/14/2020 by Will Bonnet, MD No   Obesity affecting pregnancy 11/14/2020 by Will Bonnet, MD No       Barnett Applebaum, MD, [place], [facility] 06/19/2021  2:42 PM

## 2021-06-22 ENCOUNTER — Ambulatory Visit: Payer: Managed Care, Other (non HMO) | Admitting: *Deleted

## 2021-06-22 ENCOUNTER — Encounter: Payer: Self-pay | Admitting: *Deleted

## 2021-06-22 ENCOUNTER — Other Ambulatory Visit: Payer: Self-pay

## 2021-06-22 ENCOUNTER — Encounter: Payer: Self-pay | Admitting: Dietician

## 2021-06-22 ENCOUNTER — Ambulatory Visit: Payer: Managed Care, Other (non HMO) | Attending: Obstetrics and Gynecology

## 2021-06-22 VITALS — BP 116/77 | HR 75

## 2021-06-22 DIAGNOSIS — Z3A37 37 weeks gestation of pregnancy: Secondary | ICD-10-CM | POA: Diagnosis not present

## 2021-06-22 DIAGNOSIS — O09523 Supervision of elderly multigravida, third trimester: Secondary | ICD-10-CM | POA: Diagnosis not present

## 2021-06-22 DIAGNOSIS — O99213 Obesity complicating pregnancy, third trimester: Secondary | ICD-10-CM

## 2021-06-22 DIAGNOSIS — Z6834 Body mass index (BMI) 34.0-34.9, adult: Secondary | ICD-10-CM | POA: Insufficient documentation

## 2021-06-22 DIAGNOSIS — O0990 Supervision of high risk pregnancy, unspecified, unspecified trimester: Secondary | ICD-10-CM | POA: Diagnosis present

## 2021-06-22 DIAGNOSIS — O24410 Gestational diabetes mellitus in pregnancy, diet controlled: Secondary | ICD-10-CM | POA: Diagnosis not present

## 2021-06-22 NOTE — Progress Notes (Signed)
Have not heard back from patient to reschedule her missed appointment from 05/27/21. Sent notification to referring provider.

## 2021-06-24 ENCOUNTER — Other Ambulatory Visit: Payer: Self-pay | Admitting: *Deleted

## 2021-06-25 ENCOUNTER — Other Ambulatory Visit: Payer: Self-pay

## 2021-06-25 ENCOUNTER — Encounter: Payer: Self-pay | Admitting: Obstetrics & Gynecology

## 2021-06-25 ENCOUNTER — Ambulatory Visit (INDEPENDENT_AMBULATORY_CARE_PROVIDER_SITE_OTHER): Payer: Managed Care, Other (non HMO) | Admitting: Obstetrics & Gynecology

## 2021-06-25 VITALS — BP 100/60 | Wt 199.0 lb

## 2021-06-25 DIAGNOSIS — O0993 Supervision of high risk pregnancy, unspecified, third trimester: Secondary | ICD-10-CM

## 2021-06-25 DIAGNOSIS — O09523 Supervision of elderly multigravida, third trimester: Secondary | ICD-10-CM

## 2021-06-25 DIAGNOSIS — O24415 Gestational diabetes mellitus in pregnancy, controlled by oral hypoglycemic drugs: Secondary | ICD-10-CM

## 2021-06-25 DIAGNOSIS — Z3A37 37 weeks gestation of pregnancy: Secondary | ICD-10-CM

## 2021-06-25 LAB — POCT URINALYSIS DIPSTICK OB
Glucose, UA: NEGATIVE
POC,PROTEIN,UA: NEGATIVE

## 2021-06-25 NOTE — Progress Notes (Signed)
°  Subjective  Fetal Movement? yes Contractions? no Leaking Fluid? no Vaginal Bleeding? no BS log >50% nml Objective  BP 100/60    Wt 199 lb (90.3 kg)    LMP 08/25/2020 (Approximate)    BMI 37.60 kg/m  General: NAD Pumonary: no increased work of breathing Abdomen: gravid, non-tender Extremities: no edema Psychiatric: mood appropriate, affect full  A NST procedure was performed with FHR monitoring and a normal baseline established, appropriate time of 20-40 minutes of evaluation, and accels >2 seen w 15x15 characteristics.  Results show a REACTIVE NST.   Assessment  41 y.o. [id number] at [redacted]w[redacted]d by  07/11/2021, by Ultrasound presenting for routine prenatal visit  Plan   Problem List Items Addressed This Visit      Endocrine   Gestational diabetes     Other   Supervision of high risk pregnancy, antepartum - Primary   Advanced maternal age in multigravida  Other Visit Diagnoses    [redacted] weeks gestation of pregnancy        PNV, [place], labor precautions IOL sch for 07/06/21 NST and BPP weekly Metformin, cont to monitor BS   June 2022 Problems (from 11/14/20 to present)    Problem Noted Resolved   Gestational diabetes 05/07/2021 by Rod Can, CNM No   Overview Signed 06/04/2021  2:35 PM by Allen Derry, CNM    Started on Metformin 500mg  at bedtime on 06/04/21      Supervision of high risk pregnancy, antepartum 11/14/2020 by Will Bonnet, MD No   Overview Addendum 06/19/2021  2:45 PM by Gae Dry, MD     Nursing Staff Provider  Office Location  [facility] Dating  [place]  Language  English Anatomy US  MFM  Flu Vaccine  02/02/21 Genetic Screen  NIPS: nml XY   TDaP vaccine   06/19/21 Hgb A1C or  GTT Early :116 Third trimester :   Covid Vaccinated    LAB RESULTS   Rhogam  n/a Blood Type A/Positive/-- (07/27 1009)   Feeding Plan Breast Antibody Negative (07/27 1009)  Contraception Nexpl or BTL If CS then BTL Rubella 5.32 (07/27 1009)  Circumcision undecided RPR Non  Reactive (11/30 0923)   Pediatrician  Princella Ion HBsAg Negative (07/27 1009)   Support Person Ryan  HIV Non Reactive (11/30 2330)  Prenatal Classes multip Varicella Immune    GBS Negative/-- (01/20 1025)(For PCN allergy, check sensitivities)   BTL Consent Not needed    VBAC Consent n/a Pap  02/18/20         Advanced maternal age in multigravida 11/14/2020 by Will Bonnet, MD No   Obesity affecting pregnancy 11/14/2020 by Will Bonnet, MD No       Barnett Applebaum, MD, [facility], [facility] 06/25/2021  2:33 PM

## 2021-06-25 NOTE — Patient Instructions (Signed)

## 2021-06-30 ENCOUNTER — Ambulatory Visit: Payer: Managed Care, Other (non HMO) | Admitting: *Deleted

## 2021-06-30 ENCOUNTER — Telehealth: Payer: Self-pay | Admitting: *Deleted

## 2021-06-30 ENCOUNTER — Other Ambulatory Visit: Payer: Self-pay

## 2021-06-30 ENCOUNTER — Ambulatory Visit: Payer: Managed Care, Other (non HMO) | Attending: Obstetrics

## 2021-06-30 VITALS — BP 120/66 | HR 91

## 2021-06-30 DIAGNOSIS — O99213 Obesity complicating pregnancy, third trimester: Secondary | ICD-10-CM | POA: Insufficient documentation

## 2021-06-30 DIAGNOSIS — O24410 Gestational diabetes mellitus in pregnancy, diet controlled: Secondary | ICD-10-CM

## 2021-06-30 DIAGNOSIS — O0990 Supervision of high risk pregnancy, unspecified, unspecified trimester: Secondary | ICD-10-CM

## 2021-06-30 DIAGNOSIS — Z6833 Body mass index (BMI) 33.0-33.9, adult: Secondary | ICD-10-CM | POA: Diagnosis present

## 2021-06-30 DIAGNOSIS — O09523 Supervision of elderly multigravida, third trimester: Secondary | ICD-10-CM

## 2021-06-30 DIAGNOSIS — O24415 Gestational diabetes mellitus in pregnancy, controlled by oral hypoglycemic drugs: Secondary | ICD-10-CM

## 2021-06-30 DIAGNOSIS — E669 Obesity, unspecified: Secondary | ICD-10-CM | POA: Diagnosis not present

## 2021-07-02 ENCOUNTER — Other Ambulatory Visit: Payer: Self-pay

## 2021-07-02 ENCOUNTER — Ambulatory Visit (INDEPENDENT_AMBULATORY_CARE_PROVIDER_SITE_OTHER): Payer: Managed Care, Other (non HMO) | Admitting: Obstetrics and Gynecology

## 2021-07-02 ENCOUNTER — Encounter: Payer: Self-pay | Admitting: Obstetrics and Gynecology

## 2021-07-02 VITALS — BP 124/72 | Ht 61.0 in | Wt 197.0 lb

## 2021-07-02 DIAGNOSIS — O09523 Supervision of elderly multigravida, third trimester: Secondary | ICD-10-CM

## 2021-07-02 DIAGNOSIS — O99213 Obesity complicating pregnancy, third trimester: Secondary | ICD-10-CM

## 2021-07-02 DIAGNOSIS — O24419 Gestational diabetes mellitus in pregnancy, unspecified control: Secondary | ICD-10-CM

## 2021-07-02 DIAGNOSIS — O0990 Supervision of high risk pregnancy, unspecified, unspecified trimester: Secondary | ICD-10-CM

## 2021-07-02 DIAGNOSIS — E669 Obesity, unspecified: Secondary | ICD-10-CM | POA: Diagnosis not present

## 2021-07-02 DIAGNOSIS — Z3A38 38 weeks gestation of pregnancy: Secondary | ICD-10-CM

## 2021-07-02 LAB — POCT URINALYSIS DIPSTICK OB: Glucose, UA: NEGATIVE

## 2021-07-02 LAB — FETAL NONSTRESS TEST

## 2021-07-02 NOTE — Patient Instructions (Signed)
Labor Induction Labor induction is when steps are taken to cause a pregnant woman to begin the labor process. Most women go into labor on their own between 37 weeks and 42 weeks of pregnancy. When this does not happen, or when there is a medical need for labor to begin, steps may be taken to induce, or bring on, labor. Labor induction causes a pregnant woman's uterus to contract. It also causes the cervix to soften (ripen), open (dilate), and thin out. Usually, labor is not induced before 39 weeks of pregnancy unless there is a medical reason to do so. When is labor induction considered? Labor induction may be right for you if: Your pregnancy lasts longer than 41 to 42 weeks. Your placenta is separating from your uterus (placental abruption). You have a rupture of membranes and your labor does not begin. You have health problems, like diabetes or high blood pressure (preeclampsia) during your pregnancy. Your baby has stopped growing or does not have enough amniotic fluid. Before labor induction begins, your health care provider will consider the following factors: Your medical condition and the baby's condition. How many weeks you have been pregnant. How mature the baby's lungs are. The condition of your cervix. The position of the baby. The size of your birth canal. Tell a health care provider about: Any allergies you have. All medicines you are taking, including vitamins, herbs, eye drops, creams, and over-the-counter medicines. Any problems you or your family members have had with anesthetic medicines. Any surgeries you have had. Any blood disorders you have. Any medical conditions you have. What are the risks? Generally, this is a safe procedure. However, problems may occur, including: Failed induction. Changes in fetal heart rate, such as being too high, too low, or irregular (erratic). Infection in the mother or the baby. Increased risk of having a cesarean delivery. Breaking off  (abruption) of the placenta from the uterus. This is rare. Rupture of the uterus. This is very rare. Your baby could fail to get enough blood flow or oxygen. This can be life-threatening. When induction is needed for medical reasons, the benefits generally outweigh the risks. What happens during the procedure? During the procedure, your health care provider will use one of these methods to induce labor: Stripping the membranes. In this method, the amniotic sac tissue is gently separated from the cervix. This causes the following to happen: Your cervix stretches, which in turn causes the release of prostaglandins. Prostaglandins induce labor and cause the uterus to contract. This procedure is often done in an office visit. You will be sent home to wait for contractions to begin. Prostaglandin medicine. This medicine starts contractions and causes the cervix to dilate and ripen. This can be taken by mouth (orally) or by being inserted into the vagina (suppository). Inserting a small, thin tube (catheter) with a balloon into the vagina and then expanding the balloon with water to dilate the cervix. Breaking the water. In this method, a small instrument is used to make a small hole in the amniotic sac. This eventually causes the amniotic sac to break. Contractions should begin within a few hours. Medicine to trigger or strengthen contractions. This medicine is given through an IV that is inserted into a vein in your arm. This procedure may vary among health care providers and hospitals. Where to find more information March of [employer]: www.marchofdimes.org [employer] of [employer]st[employer]ians and Gynecologists: www.acog.org Summary Labor induction causes a pregnant woman's uterus to contract. It also causes the cervix  to soften (ripen), open (dilate), and thin out. Labor is usually not induced before 39 weeks of pregnancy unless there is a medical reason to do so. When induction is needed for medical  reasons, the benefits generally outweigh the risks. Talk with your health care provider about which methods of labor induction are right for you. This information is not intended to replace advice given to you by your health care provider. Make sure you discuss any questions you have with your health care provider. Document Revised: 02/21/2020 Document Reviewed: 02/21/2020 Elsevier Patient Education  [place]. Pain Relief During Labor and Delivery Many things can cause pain during labor and delivery, including: Pressure due to the baby moving through the pelvis. Stretching of tissues due to the baby moving through the birth canal. Muscle tension due to anxiety or nervousness. The uterus tightening (contracting)and relaxing to help move the baby. How do I get pain relief during labor and delivery? Discuss your pain relief options with your health care provider during your prenatal visits. Explore the options offered by your hospital or birth center. There are many ways to deal with the pain of labor and delivery. You can try relaxation techniques or doing relaxing activities, taking a warm shower or bath (hydrotherapy), or other methods. There are also many medicines available to help control pain. Relaxation techniques and activities Practice relaxation techniques or do relaxing activities, such as: Focused breathing. Meditation. Visualization. Aroma therapy. Listening to your favorite music. Hypnosis. Hydrotherapy Take a warm shower or bath. This may: Provide comfort and relaxation. Lessen your feeling of pain. Reduce the amount of pain medicine needed. Shorten the length of labor. Other methods Try doing other things, such as: Getting a massage or having counterpressure on your back. Applying warm packs or ice packs. Changing positions often, moving around, or using a birthing ball. Medicines You may be given: Pain medicine through an IV or an injection into a  muscle. Pain medicine inserted into your spinal column. Injections of sterile water just under the skin on your lower back. Nitrous oxide inhalation therapy, also called laughing gas. What kinds of medicine are available for pain relief? There are two kinds of medicines that can be used to relieve pain during labor and delivery: Analgesics. These medicines decrease pain without causing you to lose feeling or the ability to move your muscles. Anesthetics. These medicines block feeling in the body and can decrease your ability to move freely. Both kinds of medicine can cause minor side effects, such as nausea, trouble concentrating, and sleepiness. They can also affect the baby's heart rate before birth and his or her breathing after birth. For this reason, health care providers are careful about when and how much medicine is given. Which medicines are used to provide pain relief? Common medicines The most common medicines used to help manage pain during labor and delivery include: Opioids. Opioids are medicines that decrease how much pain you feel (perception of pain). These medicines can be given through an IV or may be used with anesthetics to block pain. Epidural analgesia. Epidural analgesia is given through a very thin tube that is inserted into the lower back. Medicine is delivered continuously to the area near your spinal column nerves (epidural space). After having this treatment, you may be able to move your legs, but you will not be able to walk. Depending on the amount and type of medicine given, you may lose all feeling in the lower half of your body, or you  may have some sensation, including the urge to push. This treatment can be used to give pain relief for a vaginal birth. Sometimes, a numbing medicine is injected into the spinal fluid when an epidural catheter is placed. This provides for immediate relief but only lasts for 1-2 hours. Once it wears off, the epidural will provide pain  relief. This is called a combined spinal-epidural (CSE) block. Intrathecal analgesia (spinal analgesia). Intrathecal analgesia is similar to epidural analgesia, but the medicine is injected into the spinal fluid instead of the epidural space. It is usually only given once. It starts to relieve pain quickly, but the pain relief lasts only 1-2 hours. Pudendal block. This block is done by injecting numbing medicine through the wall of the vagina and into a nerve in the pelvis. Other medicines Other medicines used to help manage pain during labor and delivery include: Local anesthetics. These are used to numb a small area of the body. They may be used along with another kind of medicine or used to numb the nerves of the vagina, cervix, and perineum during the second stage of labor. Spinal block (spinal anesthesia). Spinal anesthesia is similar to spinal analgesia, but the medicine that is used contains longer-acting numbing medicines and pain medicines. This type of anesthesia can be used for a cesarean delivery and allows you to stay awake for the birth of your baby. General anesthetics cause you to lose consciousness so you do not feel pain. They are usually only used for an emergency cesarean delivery. These medicines are given through an IV or a mask or both. These medicines are used as part of a procedure or for an emergency delivery. Summary Women have many options to help them manage the pain associated with labor and delivery. You can try doing relaxing activities, taking a warm shower or bath, or other methods. There are also many medicines available to help control pain during labor and delivery. Talk with your health care provider about what options are available to you. This information is not intended to replace advice given to you by your health care provider. Make sure you discuss any questions you have with your health care provider. Document Revised: 03/28/2019 Document Reviewed:  03/28/2019 Elsevier Patient Education  [place].

## 2021-07-02 NOTE — Progress Notes (Signed)
Routine Prenatal Care Visit  Subjective  Deborah Mccarthy is a 41 y.o. [id number] at [redacted]w[redacted]d being seen today for ongoing prenatal care.  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  ----------------------------------------------------------------------------------- Patient reports no complaints.   Contractions: Irritability. Vag. Bleeding: None.  Movement: Present. Denies leaking of fluid.  ----------------------------------------------------------------------------------- The following portions of the patient's history were reviewed and updated as appropriate: allergies, current medications, past family history, past medical history, past social history, past surgical history and problem list. Problem list updated.   Objective  Blood pressure 124/72, height 5\' 1"  (1.549 m), last menstrual period 08/25/2020. Pregravid weight 178 lb (80.7 kg) Total Weight Gain 21 lb (9.526 kg) Urinalysis:      Fetal Status: Fetal Heart Rate (bpm): 130   Movement: Present  Presentation: Vertex  General:  Alert, oriented and cooperative. Patient is in no acute distress.  Skin: Skin is warm and dry. No rash noted.   Cardiovascular: Normal heart rate noted  Respiratory: Normal respiratory effort, no problems with respiration noted  Abdomen: Soft, gravid, appropriate for gestational age. Pain/Pressure: Present     Pelvic:  Cervical exam performed Dilation: 1 Effacement (%): 70 Station: -3  Extremities: Normal range of motion.  Edema: None  Mental Status: Normal mood and affect. Normal behavior. Normal judgment and thought content.     Assessment   41 y.o. [id number] at [redacted]w[redacted]d by  07/11/2021, by Ultrasound  presenting for routine prenatal visit  Plan   June 2022 Problems (from 11/14/20 to present)     Problem Noted Resolved   Gestational diabetes 05/07/2021 by Rod Can, CNM No   Overview Signed 06/04/2021  2:35 PM by Allen Derry, CNM    Started on Metformin 500mg  at bedtime on 06/04/21      Supervision of high risk pregnancy, antepartum 11/14/2020 by Will Bonnet, MD No   Overview Addendum 07/02/2021  3:30 PM by Homero Fellers, MD     Nursing Staff Provider  Office Location  [facility] Dating  [place]  Language  English Anatomy US  MFM  Flu Vaccine  02/02/21 Genetic Screen  NIPS: nml XY   TDaP vaccine   06/19/21 Hgb A1C or  GTT Early :116 Third trimester :3 hr: 3 of 4 elevated  Covid Vaccinated    LAB RESULTS   Rhogam  n/a Blood Type A/Positive/-- (07/27 1009)   Feeding Plan Breast Antibody Negative (07/27 1009)  Contraception Nexpl or BTL If CS then BTL Rubella 5.32 (07/27 1009)  Circumcision undecided RPR Non Reactive (11/30 0923)   Pediatrician  Princella Ion HBsAg Negative (07/27 1009)   Support Person Ryan  HIV Non Reactive (11/30 1914)  Prenatal Classes multip Varicella Immune    GBS Negative/-- (01/20 1025)(For PCN allergy, check sensitivities)   BTL Consent Not needed    VBAC Consent n/a Pap  02/18/20         Advanced maternal age in multigravida 11/14/2020 by Will Bonnet, MD No   Obesity affecting pregnancy 11/14/2020 by Will Bonnet, MD No       Reviewed IOL procedures NST: 130 bpm baseline, moderate variability, 15x15 accelerations, no decelerations. Reactive   Gestational age appropriate obstetric precautions including but not limited to vaginal bleeding, contractions, leaking of  fluid and fetal movement were reviewed in detail with the patient.    Return if symptoms worsen or fail to improve.  Homero Fellers MD [facility] 07/02/2021, 3:53 PM

## 2021-07-03 ENCOUNTER — Other Ambulatory Visit: Payer: Self-pay | Admitting: Obstetrics & Gynecology

## 2021-07-03 ENCOUNTER — Other Ambulatory Visit
Admission: RE | Admit: 2021-07-03 | Discharge: 2021-07-03 | Disposition: A | Discharge status: Home / Self Care | Payer: Managed Care, Other (non HMO) | Source: Ambulatory Visit | Attending: Licensed Practical Nurse | Admitting: Licensed Practical Nurse

## 2021-07-03 DIAGNOSIS — Z20822 Contact with and (suspected) exposure to covid-19: Secondary | ICD-10-CM

## 2021-07-03 DIAGNOSIS — Z01812 Encounter for preprocedural laboratory examination: Secondary | ICD-10-CM | POA: Insufficient documentation

## 2021-07-03 LAB — SARS CORONAVIRUS 2 (TAT 6-24 HRS): SARS Coronavirus 2: NEGATIVE

## 2021-07-06 ENCOUNTER — Inpatient Hospital Stay (HOSPITAL_COMMUNITY)
Admission: AD | Admit: 2021-07-06 | Payer: Managed Care, Other (non HMO) | Source: Home / Self Care | Admitting: Obstetrics & Gynecology

## 2021-07-06 ENCOUNTER — Other Ambulatory Visit: Payer: Self-pay

## 2021-07-06 ENCOUNTER — Other Ambulatory Visit: Payer: Self-pay | Admitting: Advanced Practice Midwife

## 2021-07-06 ENCOUNTER — Inpatient Hospital Stay
Admission: RE | Admit: 2021-07-06 | Discharge: 2021-07-07 | DRG: 807 | Disposition: A | Discharge status: Home / Self Care | Payer: Managed Care, Other (non HMO) | Attending: Obstetrics & Gynecology | Admitting: Obstetrics & Gynecology

## 2021-07-06 ENCOUNTER — Inpatient Hospital Stay: Payer: Managed Care, Other (non HMO) | Admitting: Anesthesiology

## 2021-07-06 DIAGNOSIS — O24425 Gestational diabetes mellitus in childbirth, controlled by oral hypoglycemic drugs: Secondary | ICD-10-CM | POA: Diagnosis present

## 2021-07-06 DIAGNOSIS — O09529 Supervision of elderly multigravida, unspecified trimester: Secondary | ICD-10-CM

## 2021-07-06 DIAGNOSIS — O24415 Gestational diabetes mellitus in pregnancy, controlled by oral hypoglycemic drugs: Secondary | ICD-10-CM

## 2021-07-06 DIAGNOSIS — O99213 Obesity complicating pregnancy, third trimester: Secondary | ICD-10-CM

## 2021-07-06 DIAGNOSIS — Z87891 Personal history of nicotine dependence: Secondary | ICD-10-CM

## 2021-07-06 DIAGNOSIS — O24410 Gestational diabetes mellitus in pregnancy, diet controlled: Secondary | ICD-10-CM

## 2021-07-06 DIAGNOSIS — O99214 Obesity complicating childbirth: Secondary | ICD-10-CM | POA: Diagnosis present

## 2021-07-06 DIAGNOSIS — E669 Obesity, unspecified: Secondary | ICD-10-CM | POA: Diagnosis not present

## 2021-07-06 DIAGNOSIS — O98813 Other maternal infectious and parasitic diseases complicating pregnancy, third trimester: Secondary | ICD-10-CM | POA: Diagnosis not present

## 2021-07-06 DIAGNOSIS — Z3A39 39 weeks gestation of pregnancy: Secondary | ICD-10-CM | POA: Diagnosis not present

## 2021-07-06 DIAGNOSIS — Z20822 Contact with and (suspected) exposure to covid-19: Secondary | ICD-10-CM | POA: Diagnosis present

## 2021-07-06 DIAGNOSIS — A630 Anogenital (venereal) warts: Secondary | ICD-10-CM | POA: Diagnosis not present

## 2021-07-06 DIAGNOSIS — O09523 Supervision of elderly multigravida, third trimester: Secondary | ICD-10-CM | POA: Diagnosis not present

## 2021-07-06 DIAGNOSIS — O0990 Supervision of high risk pregnancy, unspecified, unspecified trimester: Secondary | ICD-10-CM

## 2021-07-06 DIAGNOSIS — Z3490 Encounter for supervision of normal pregnancy, unspecified, unspecified trimester: Secondary | ICD-10-CM | POA: Diagnosis present

## 2021-07-06 DIAGNOSIS — O24419 Gestational diabetes mellitus in pregnancy, unspecified control: Secondary | ICD-10-CM | POA: Diagnosis present

## 2021-07-06 LAB — TYPE AND SCREEN
ABO/RH(D): A POS
Antibody Screen: NEGATIVE

## 2021-07-06 LAB — CBC
HCT: 32.8 % — ABNORMAL LOW (ref 36.0–46.0)
Hemoglobin: 11 g/dL — ABNORMAL LOW (ref 12.0–15.0)
MCH: 30.1 pg (ref 26.0–34.0)
MCHC: 33.5 g/dL (ref 30.0–36.0)
MCV: 89.6 fL (ref 80.0–100.0)
Platelets: 330 10*3/uL (ref 150–400)
RBC: 3.66 MIL/uL — ABNORMAL LOW (ref 3.87–5.11)
RDW: 13.4 % (ref 11.5–15.5)
WBC: 13.4 10*3/uL — ABNORMAL HIGH (ref 4.0–10.5)
nRBC: 0 % (ref 0.0–0.2)

## 2021-07-06 LAB — ABO/RH: ABO/RH(D): A POS

## 2021-07-06 LAB — GLUCOSE, CAPILLARY: Glucose-Capillary: 108 mg/dL — ABNORMAL HIGH (ref 70–99)

## 2021-07-06 MED ORDER — OXYTOCIN 10 UNIT/ML IJ SOLN
INTRAMUSCULAR | Status: AC
  Filled 2021-07-06: qty 2

## 2021-07-06 MED ORDER — LACTATED RINGERS IV SOLN: 500.0000 mL | INTRAVENOUS | Status: DC | PRN

## 2021-07-06 MED ORDER — ONDANSETRON HCL 4 MG/2ML IJ SOLN: 4.0000 mg | Freq: Four times a day (QID) | INTRAMUSCULAR | Status: DC | PRN

## 2021-07-06 MED ORDER — IBUPROFEN 600 MG PO TABS
600.0000 mg | ORAL_TABLET | Freq: Four times a day (QID) | ORAL | Status: DC
  Administered 2021-07-06 – 2021-07-07 (×5): 600 mg via ORAL
  Filled 2021-07-06 (×5): qty 1

## 2021-07-06 MED ORDER — LACTATED RINGERS IV SOLN: 500.0000 mL | Freq: Once | INTRAVENOUS | Status: DC

## 2021-07-06 MED ORDER — EPHEDRINE 5 MG/ML INJ: 10.0000 mg | INTRAVENOUS | Status: DC | PRN

## 2021-07-06 MED ORDER — FENTANYL-BUPIVACAINE-NACL 0.5-0.125-0.9 MG/250ML-% EP SOLN
EPIDURAL | Status: DC | PRN
  Administered 2021-07-06: 12 mL/h via EPIDURAL

## 2021-07-06 MED ORDER — PRENATAL MULTIVITAMIN CH
1.0000 | ORAL_TABLET | Freq: Every day | ORAL | Status: DC
  Administered 2021-07-07: 1 via ORAL
  Filled 2021-07-06: qty 1

## 2021-07-06 MED ORDER — LIDOCAINE HCL (PF) 1 % IJ SOLN
30.0000 mL | INTRAMUSCULAR | Status: DC | PRN
  Filled 2021-07-06: qty 30

## 2021-07-06 MED ORDER — SOD CITRATE-CITRIC ACID 500-334 MG/5ML PO SOLN: 30.0000 mL | ORAL | Status: DC | PRN

## 2021-07-06 MED ORDER — OXYTOCIN-SODIUM CHLORIDE 30-0.9 UT/500ML-% IV SOLN
1.0000 m[IU]/min | INTRAVENOUS | Status: DC
  Administered 2021-07-06: 2 m[IU]/min via INTRAVENOUS
  Filled 2021-07-06: qty 500

## 2021-07-06 MED ORDER — COCONUT OIL OIL
1.0000 "application " | TOPICAL_OIL | Status: DC | PRN
  Filled 2021-07-06: qty 120

## 2021-07-06 MED ORDER — FENTANYL-BUPIVACAINE-NACL 0.5-0.125-0.9 MG/250ML-% EP SOLN: 12.0000 mL/h | EPIDURAL | Status: DC | PRN

## 2021-07-06 MED ORDER — ACETAMINOPHEN 325 MG PO TABS
650.0000 mg | ORAL_TABLET | ORAL | Status: DC | PRN
  Administered 2021-07-07: 650 mg via ORAL
  Filled 2021-07-06: qty 2

## 2021-07-06 MED ORDER — SIMETHICONE 80 MG PO CHEW
80.0000 mg | CHEWABLE_TABLET | ORAL | Status: DC | PRN
  Administered 2021-07-06: 80 mg via ORAL
  Filled 2021-07-06: qty 1

## 2021-07-06 MED ORDER — LIDOCAINE HCL (PF) 1 % IJ SOLN
INTRAMUSCULAR | Status: DC | PRN
  Administered 2021-07-06: 3 mL

## 2021-07-06 MED ORDER — OXYTOCIN-SODIUM CHLORIDE 30-0.9 UT/500ML-% IV SOLN
2.5000 [IU]/h | INTRAVENOUS | Status: DC
  Filled 2021-07-06: qty 500

## 2021-07-06 MED ORDER — DIPHENHYDRAMINE HCL 50 MG/ML IJ SOLN: 12.5000 mg | INTRAMUSCULAR | Status: DC | PRN

## 2021-07-06 MED ORDER — SENNOSIDES-DOCUSATE SODIUM 8.6-50 MG PO TABS: 2.0000 | ORAL_TABLET | Freq: Every day | ORAL | Status: DC

## 2021-07-06 MED ORDER — ONDANSETRON HCL 4 MG/2ML IJ SOLN: 4.0000 mg | INTRAMUSCULAR | Status: DC | PRN

## 2021-07-06 MED ORDER — AMMONIA AROMATIC IN INHA
RESPIRATORY_TRACT | Status: AC
  Filled 2021-07-06: qty 10

## 2021-07-06 MED ORDER — BUPIVACAINE HCL (PF) 0.25 % IJ SOLN
INTRAMUSCULAR | Status: DC | PRN
  Administered 2021-07-06: 3 mL via EPIDURAL
  Administered 2021-07-06: 4 mL via EPIDURAL

## 2021-07-06 MED ORDER — FENTANYL-BUPIVACAINE-NACL 0.5-0.125-0.9 MG/250ML-% EP SOLN
EPIDURAL | Status: AC
  Filled 2021-07-06: qty 250

## 2021-07-06 MED ORDER — WITCH HAZEL-GLYCERIN EX PADS: 1.0000 "application " | MEDICATED_PAD | CUTANEOUS | Status: DC | PRN

## 2021-07-06 MED ORDER — MISOPROSTOL 200 MCG PO TABS
ORAL_TABLET | ORAL | Status: AC
  Filled 2021-07-06: qty 4

## 2021-07-06 MED ORDER — BUTORPHANOL TARTRATE 1 MG/ML IJ SOLN: 1.0000 mg | INTRAMUSCULAR | Status: DC | PRN

## 2021-07-06 MED ORDER — DIBUCAINE (PERIANAL) 1 % EX OINT: 1.0000 "application " | TOPICAL_OINTMENT | CUTANEOUS | Status: DC | PRN

## 2021-07-06 MED ORDER — LIDOCAINE-EPINEPHRINE (PF) 1.5 %-1:200000 IJ SOLN
INTRAMUSCULAR | Status: DC | PRN
  Administered 2021-07-06: 3 mL via PERINEURAL

## 2021-07-06 MED ORDER — LACTATED RINGERS IV SOLN: INTRAVENOUS | Status: DC

## 2021-07-06 MED ORDER — BENZOCAINE-MENTHOL 20-0.5 % EX AERO: 1.0000 "application " | INHALATION_SPRAY | CUTANEOUS | Status: DC | PRN

## 2021-07-06 MED ORDER — PHENYLEPHRINE 40 MCG/ML (10ML) SYRINGE FOR IV PUSH (FOR BLOOD PRESSURE SUPPORT): 80.0000 ug | PREFILLED_SYRINGE | INTRAVENOUS | Status: DC | PRN

## 2021-07-06 MED ORDER — MISOPROSTOL 25 MCG QUARTER TABLET: 25.0000 ug | ORAL_TABLET | ORAL | Status: DC | PRN

## 2021-07-06 MED ORDER — DIPHENHYDRAMINE HCL 25 MG PO CAPS: 25.0000 mg | ORAL_CAPSULE | Freq: Four times a day (QID) | ORAL | Status: DC | PRN

## 2021-07-06 MED ORDER — TERBUTALINE SULFATE 1 MG/ML IJ SOLN: 0.2500 mg | Freq: Once | INTRAMUSCULAR | Status: DC | PRN

## 2021-07-06 MED ORDER — OXYTOCIN BOLUS FROM INFUSION
333.0000 mL | Freq: Once | INTRAVENOUS | Status: AC
  Administered 2021-07-06: 333 mL via INTRAVENOUS

## 2021-07-06 MED ORDER — ONDANSETRON HCL 4 MG PO TABS: 4.0000 mg | ORAL_TABLET | ORAL | Status: DC | PRN

## 2021-07-06 NOTE — Anesthesia Procedure Notes (Signed)
Epidural Patient location during procedure: OB Start time: 07/06/2021 12:14 PM End time: 07/06/2021 12:24 PM  Staffing Anesthesiologist: Darrin Nipper, MD Resident/CRNA: Jerrye Noble, CRNA Performed: resident/CRNA   Preanesthetic Checklist Completed: patient identified, IV checked, site marked, risks and benefits discussed, surgical consent, monitors and equipment checked, pre-op evaluation and timeout performed  Epidural Patient position: sitting Prep: ChloraPrep Patient monitoring: heart rate, continuous pulse ox and blood pressure Approach: midline Location: L3-L4 Injection technique: LOR saline  Needle:  Needle type: Tuohy  Needle gauge: 17 G Needle length: 9 cm and 9 Needle insertion depth: 8 cm Catheter type: closed end flexible Catheter size: 19 Gauge Catheter at skin depth: 12 cm Test dose: negative and 1.5% lidocaine with Epi 1:200 K  Assessment Sensory level: T10 Events: blood not aspirated, injection not painful, no injection resistance, no paresthesia and negative IV test  Additional Notes 1 attempt Pt. Evaluated and documentation done after procedure finished. Patient identified. Risks/Benefits/Options discussed with patient including but not limited to bleeding, infection, nerve damage, paralysis, failed block, incomplete pain control, headache, blood pressure changes, nausea, vomiting, reactions to medication both or allergic, itching and postpartum back pain. Confirmed with bedside nurse the patient's most recent platelet count. Confirmed with patient that they are not currently taking any anticoagulation, have any bleeding history or any family history of bleeding disorders. Patient expressed understanding and wished to proceed. All questions were answered. Sterile technique was used throughout the entire procedure. Please see nursing notes for vital signs. Test dose was given through epidural catheter and negative prior to continuing to dose epidural or  start infusion. Warning signs of high block given to the patient including shortness of breath, tingling/numbness in hands, complete motor block, or any concerning symptoms with instructions to call for help. Patient was given instructions on fall risk and not to get out of bed. All questions and concerns addressed with instructions to call with any issues or inadequate analgesia.    Patient tolerated the insertion well without immediate complications.Reason for block:procedure for pain

## 2021-07-06 NOTE — Lactation Note (Signed)
This note was copied from a baby's chart. Lactation Consultation Note  Patient Name: Deborah Mccarthy [id number] Date: 07/06/2021 Reason for consult: L&D Initial assessment;Term;Other (Comment) (IOL; GDM) Age:43 hours  Mom is P3, SVD <1hr prior to start of feed. Mom has GDM; glucose protocol initiated.   Maternal Data Has patient been taught Hand Expression?: Yes Does the patient have breastfeeding experience prior to this delivery?: Yes How long did the patient breastfeed?: short period Mom reports attempting to breastfeed with her other 2 children. Breastfed for total of 6 weeks but had difficulty with supply; formula introduced.  Feeding Mother's Current Feeding Choice: Breast Milk and Formula  LATCH Score Latch: Repeated attempts needed to sustain latch, nipple held in mouth throughout feeding, stimulation needed to elicit sucking reflex.  Audible Swallowing: A few with stimulation  Type of Nipple: Everted at rest and after stimulation  Comfort (Breast/Nipple): Soft / non-tender  Hold (Positioning): Assistance needed to correctly position infant at breast and maintain latch.  LATCH Score: 7  Attempts made to latch initially were unsuccessful. After weight/measurements baby was brought back to breast. Latch achieved with some discomfort; LC adjusted baby and pulled down on chin for more flanged bottom lip. After several minutes baby did slip off nipple onto breast tissue and created more discomfort/bruise noted. Baby re-latched for additional 10 minutes; mom is somewhat sensitive and acknowledges that this is normal for her. Baby had flanged upper/bottom lips, rhythmic suck pattern, identified swallows. Baby off breast on his own, round nipple noted.  Lactation Tools Discussed/Used    Interventions Interventions: Breast feeding basics reviewed;Assisted with latch;Hand express;Skin to skin;Breast compression;Adjust position;Support pillows;Position options;Education  Reviewed  glucose protocol and navigation based on results. Typical newborn feeding patterns and behaviors, early feeding cues, feeding on demand, and signs that baby is getting enough.  Discharge    Consult Status Consult Status: Follow-up from L&D    Lavonia Drafts 07/06/2021, 5:01 PM

## 2021-07-06 NOTE — Progress Notes (Signed)
Order placed for 2 hr postpartum gtt.

## 2021-07-06 NOTE — Discharge Summary (Signed)
OB Discharge Summary     Patient Name: Deborah Mccarthy DOB: [birth year] MRN: [id number]  Date of admission: 07/06/2021 Delivering provider: Rod Can, CNM  Date of Delivery: 07/06/2021  Date of discharge: 07/07/2021  Admitting diagnosis: Encounter for induction of labor [Z34.90] Intrauterine pregnancy: [redacted]w[redacted]d    Secondary diagnosis: Gestational Diabetes medication controlled (A2)     Discharge diagnosis: Term Pregnancy Delivered                                                                                                Post partum procedures: none. Was not able to get a BTL post delivery. Needs to schedule this for 6 wks PP  Augmentation: Pitocin  Complications: None  Hospital course:  Induction of Labor With Vaginal Delivery   41y.o. yo [id number] 347w2das admitted to the hospital 07/06/2021 for induction of labor.  Indication for induction: A2 DM.  Patient had an uncomplicated labor course as follows: Membrane Rupture Time/Date: 12:59 PM ,07/06/2021  light meconium stained Delivery Method:Vaginal, Spontaneous  Episiotomy: None  Lacerations:  None  Details of delivery can be found in separate delivery note.    Patient had a routine postpartum course. yes  Patient is discharged home 07/07/21.  Newborn Data: Birth date:07/06/2021  Birth time:3:44 PM  Gender:Female  Living status:Living  Apgars:9 ,10  Weight:2810 g  6 pounds 3 ounces  Physical exam  Vitals:   07/06/21 2316 07/07/21 0337 07/07/21 0745 07/07/21 0934  BP: 107/72 106/67 105/63 103/69  Pulse: 78 71 76 72  Resp: 18 18 18 20   Temp: 98.2 F (36.8 C) 98.4 F (36.9 C) 98.1 F (36.7 C) 98.2 F (36.8 C)  TempSrc: Oral Oral Oral Temporal  SpO2: 99% 100% 100% 98%  Weight:      Height:       General: alert, cooperative, and no distress Lochia: appropriate Uterine Fundus: firm Incision: N/A DVT Evaluation: No evidence of DVT seen on physical exam. Negative Homan's sign.  Labs: Lab Results  Component  Value Date   WBC 15.6 (H) 07/07/2021   HGB 10.4 (L) 07/07/2021   HCT 32.2 (L) 07/07/2021   MCV 91.2 07/07/2021   PLT 298 07/07/2021    Discharge instruction: per After Visit Summary.  Medications:  Allergies as of 07/07/2021   No Known Allergies      Medication List     STOP taking these medications    Accu-Chek Guide w/Device Kit   Accu-Chek Softclix Lancets lancets   glucose blood test strip   metFORMIN 500 MG tablet Commonly known as: GLUCOPHAGE       TAKE these medications    ibuprofen 600 MG tablet Commonly known as: ADVIL Take 1 tablet (600 mg total) by mouth every 6 (six) hours.   multivitamin-prenatal 27-0.8 MG Tabs tablet Take 1 tablet by mouth daily at 12 noon.        Diet: carb modified diet  Activity: Advance as tolerated. Pelvic rest for 6 weeks.   Outpatient follow up:  Follow-up Information     [name]. Schedule an appointment as soon as possible  for a visit in 4 week(s).   Specialty: Obstetrics Why: 4-6 week postpartum visit and 2 hr glucose test Contact information: [address] [place] [address] [phone]                   Postpartum contraception: Tubal Ligation Rhogam Given postpartum: Rh positive Rubella vaccine given postpartum: immune Varicella vaccine given postpartum: immune TDaP given antepartum or postpartum: given antepartum   Newborn Delivery   Birth date/time: 07/06/2021 15:44:00 Delivery type: Vaginal, Spontaneous       Baby Feeding: Breast  Disposition:home with mother  SIGNED:  Imagene Riches, CNM 07/07/2021 3:24 PM

## 2021-07-06 NOTE — H&P (Signed)
OB History & Physical   History of Present Illness:  Chief Complaint: IOL GDMA1  HPI:  Deborah Mccarthy is a 41 y.o. L3Y1017 female at 55w2ddated by 7 week ultrasound.  Her pregnancy has been complicated by advanced maternal age in multigravida, obesity, gestational diabetes oral medication controlled, condyloma acuminata .    She reports irregular contractions.   She denies leakage of fluid.   She reports spotting vaginal bleeding.   She reports fetal movement.    Total weight gain for pregnancy: 8.618 kg   Obstetrical Problem List: June 2022 Problems (from 11/14/20 to present)     Problem Noted Resolved   Gestational diabetes 05/07/2021 by GRod Can CNM No   Overview Signed 06/04/2021  2:35 PM by DAllen Derry CNM    Started on Metformin 5048mat bedtime on 06/04/21      Supervision of high risk pregnancy, antepartum 11/14/2020 by JaWill BonnetMD No   Overview Addendum 07/02/2021  3:30 PM by ScHomero FellersMD     Nursing Staff Provider  Office Location  [facility] Dating  [place]  English Anatomy [place]FM  Flu Vaccine  02/02/21 Genetic Screen  NIPS: nml XY   TDaP vaccine   06/19/21 Hgb A1C or  GTT Early :116 Third trimester :3 hr: 3 of 4 elevated  Covid Vaccinated    LAB RESULTS   Rhogam  n/a Blood Type A/Positive/-- (07/27 1009)   Feeding Plan Breast Antibody Negative (07/27 1009)  Contraception Nexpl or BTL If CS then BTL Rubella 5.32 (07/27 1009)  Circumcision undecided RPR Non Reactive (11/30 0923)   Pediatrician  ChPrincella IonBsAg Negative (07/27 1009)   Support Person Ryan  HIV Non Reactive (11/30 095102 Prenatal Classes multip Varicella Immune    GBS Negative/-- (01/20 1025)(For PCN allergy, check sensitivities)   BTL Consent Not needed    VBAC Consent n/a Pap  02/18/20         Advanced maternal age in multigravida 11/14/2020 by JaWill BonnetMD No   Obesity affecting pregnancy 11/14/2020 by JaWill BonnetMD No         Maternal Medical History:   Past Medical History:  Diagnosis Date   Abnormal Pap smear of vagina    BV (bacterial vaginosis)    Cervical dysplasia    Chlamydia    Depression    Dysmenorrhea    Gestational diabetes    Hives    Menorrhagia    Ovarian cyst    Rectal ulcer    Negative for Crohn's   Vitamin D deficiency     Past Surgical History:  Procedure Laterality Date   CRYOTHERAPY     FINGER SURGERY     PILONIDAL CYST EXCISION     WISDOM TOOTH EXTRACTION      No Known Allergies  Prior to Admission medications   Medication Sig Start Date End Date Taking? Authorizing Provider  Accu-Chek Softclix Lancets lancets Use as instructed 4 times daily 05/03/21  Yes HaGae DryMD  Blood Glucose Monitoring Suppl (ACCU-CHEK GUIDE) w/Device KIT Use as directed, 4 times daily 05/05/21  Yes HaGae DryMD  glucose blood test strip Use as instructed 4 times daily 05/03/21  Yes HaGae DryMD  metFORMIN (GLUCOPHAGE) 500 MG tablet Take 1 tablet (500 mg total) by mouth at bedtime. 06/04/21  Yes Dominic, LyNunzio CobbsCNM  Prenatal Vit-Fe Fumarate-FA (MULTIVITAMIN-PRENATAL) 27-0.8 MG TABS tablet Take 1 tablet by mouth  daily at 12 noon.   Yes [provider]    OB History  Gravida Para Term Preterm AB Living  _0 SAB IAB Ectopic Multiple Live Births  _1 # Outcome Date GA Lbr Len/2nd Weight Sex Delivery Anes PTL Lv  5 Current           4 Term 03/13/14 [redacted]w[redacted]d 3266 g  Vag-Spont     3 Term 01/02/04   3402 g F Vag-Spont     2 IAB           1 SAB             Prenatal care site: [facility]  Social History: She  reports that she quit smoking about 8 months ago. Her smoking use included cigarettes. She has a 15.00 pack-year smoking history. She has never used smokeless tobacco. She reports that she does not drink alcohol and does not use drugs.  Family History: family history includes Breast cancer (age of onset: 516 in her maternal  grandmother; Colon cancer in her maternal grandfather and another family member; Coronary artery disease in her father and maternal grandmother; Diabetes in her father; GBerenice Primas disease in her mother.    Review of Systems:  Review of Systems  Constitutional:  Negative for chills and fever.  HENT:  Negative for congestion, ear discharge, ear pain, hearing loss, sinus pain and sore throat.   Eyes:  Negative for blurred vision and double vision.  Respiratory:  Negative for cough, shortness of breath and wheezing.   Cardiovascular:  Negative for chest pain, palpitations and leg swelling.  Gastrointestinal:  Negative for abdominal pain, blood in stool, constipation, diarrhea, heartburn, melena, nausea and vomiting.  Genitourinary:  Negative for dysuria, flank pain, frequency, hematuria and urgency.  Musculoskeletal:  Negative for back pain, joint pain and myalgias.  Skin:  Negative for itching and rash.  Neurological:  Negative for dizziness, tingling, tremors, sensory change, speech change, focal weakness, seizures, loss of consciousness, weakness and headaches.  Endo/Heme/Allergies:  Negative for environmental allergies. Does not bruise/bleed easily.  Psychiatric/Behavioral:  Negative for depression, hallucinations, memory loss, substance abuse and suicidal ideas. The patient is not nervous/anxious and does not have insomnia.     Physical Exam:  BP 118/81 (BP Location: Left Arm)    Pulse 92    Temp 97.8 F (36.6 C) (Oral)    Resp 18    Ht 5' 1" (1.549 m)    Wt 89.4 kg    LMP 08/25/2020 (Approximate)    SpO2 100%    BMI 37.22 kg/m   Constitutional: Well nourished, well developed female in no acute distress.  HEENT: normal Skin: Warm and dry.  Cardiovascular: Regular rate and rhythm.   Extremity:  no edema   Respiratory: Clear to auscultation bilateral. Normal respiratory effort Abdomen: FHT present Back: no CVAT Neuro: DTRs 2+, Cranial nerves grossly intact Psych: Alert and Oriented x3. No  memory deficits. Normal mood and affect.  MS: normal gait, normal bilateral lower extremity ROM/strength/stability.  Pelvic exam: per RN B. NRicky Ala2/80/-2   Baseline FHR: 135 beats/min   Variability: moderate   Accelerations: present   Decelerations: absent Contractions: present frequency: every 2-4 Overall assessment: reassuring   Lab Results  Component Value Date   SEatonNEGATIVE 07/03/2021    Assessment:  RDoyle Tegethoffis a 41y.o. [id number] at 330w2dith scheduled induction of labor, GDM  A1.   Plan:  Admit to Labor & Delivery  CBC, T&S, Clrs, IVF GBS negative.   Fetal well-being: Category I Begin induction with pitocin   Rod Can, CNM 07/06/2021 10:16 AM

## 2021-07-06 NOTE — H&P (Signed)
Addendum/Correction to H&P written 07/06/21  Patient is diagnosed with gestational diabetes type A2, not type A1 as incorrectly stated in H&P.  She has had good control of blood sugar on Metformin.   Rod Can, CNM

## 2021-07-07 ENCOUNTER — Encounter
Admission: RE | Disposition: A | Discharge status: Home / Self Care | Payer: Self-pay | Source: Home / Self Care | Attending: Obstetrics & Gynecology

## 2021-07-07 ENCOUNTER — Ambulatory Visit: Payer: Managed Care, Other (non HMO)

## 2021-07-07 ENCOUNTER — Encounter: Payer: Self-pay | Admitting: Certified Registered Nurse Anesthetist

## 2021-07-07 DIAGNOSIS — O98813 Other maternal infectious and parasitic diseases complicating pregnancy, third trimester: Secondary | ICD-10-CM

## 2021-07-07 DIAGNOSIS — E669 Obesity, unspecified: Secondary | ICD-10-CM

## 2021-07-07 DIAGNOSIS — A630 Anogenital (venereal) warts: Secondary | ICD-10-CM

## 2021-07-07 DIAGNOSIS — Z3A39 39 weeks gestation of pregnancy: Secondary | ICD-10-CM

## 2021-07-07 DIAGNOSIS — O24415 Gestational diabetes mellitus in pregnancy, controlled by oral hypoglycemic drugs: Secondary | ICD-10-CM

## 2021-07-07 DIAGNOSIS — O09523 Supervision of elderly multigravida, third trimester: Secondary | ICD-10-CM

## 2021-07-07 DIAGNOSIS — O99214 Obesity complicating childbirth: Secondary | ICD-10-CM

## 2021-07-07 LAB — CBC
HCT: 32.2 % — ABNORMAL LOW (ref 36.0–46.0)
Hemoglobin: 10.4 g/dL — ABNORMAL LOW (ref 12.0–15.0)
MCH: 29.5 pg (ref 26.0–34.0)
MCHC: 32.3 g/dL (ref 30.0–36.0)
MCV: 91.2 fL (ref 80.0–100.0)
Platelets: 298 10*3/uL (ref 150–400)
RBC: 3.53 MIL/uL — ABNORMAL LOW (ref 3.87–5.11)
RDW: 13.4 % (ref 11.5–15.5)
WBC: 15.6 10*3/uL — ABNORMAL HIGH (ref 4.0–10.5)
nRBC: 0 % (ref 0.0–0.2)

## 2021-07-07 LAB — RPR: RPR Ser Ql: NONREACTIVE

## 2021-07-07 SURGERY — LIGATION, FALLOPIAN TUBE, POSTPARTUM
Anesthesia: Choice | Laterality: Bilateral

## 2021-07-07 MED ORDER — BUPIVACAINE LIPOSOME 1.3 % IJ SUSP
INTRAMUSCULAR | Status: AC
  Filled 2021-07-07: qty 20

## 2021-07-07 MED ORDER — IBUPROFEN 600 MG PO TABS: 600.0000 mg | ORAL_TABLET | Freq: Four times a day (QID) | ORAL | 0 refills | Status: DC

## 2021-07-07 SURGICAL SUPPLY — 30 items
BACTOSHIELD CHG 4% 4OZ (MISCELLANEOUS) ×1
BLADE SURG SZ11 CARB STEEL (BLADE) ×2 IMPLANT
CHLORAPREP W/TINT 26 (MISCELLANEOUS) ×2 IMPLANT
DERMABOND ADVANCED (GAUZE/BANDAGES/DRESSINGS) ×1
DERMABOND ADVANCED .7 DNX12 (GAUZE/BANDAGES/DRESSINGS) ×1 IMPLANT
DRAPE LAPAROTOMY 100X77 ABD (DRAPES) ×2 IMPLANT
ELECT CAUTERY BLADE 6.4 (BLADE) ×2 IMPLANT
ELECT REM PT RETURN 9FT ADLT (ELECTROSURGICAL) ×2
ELECTRODE REM PT RTRN 9FT ADLT (ELECTROSURGICAL) ×1 IMPLANT
GAUZE 4X4 16PLY ~~LOC~~+RFID DBL (SPONGE) ×2 IMPLANT
GLOVE SURG SYN 6.5 ES PF (GLOVE) ×4 IMPLANT
GLOVE SURG UNDER POLY LF SZ6.5 (GLOVE) ×4 IMPLANT
GOWN STRL REUS W/ TWL LRG LVL3 (GOWN DISPOSABLE) ×2 IMPLANT
GOWN STRL REUS W/TWL LRG LVL3 (GOWN DISPOSABLE) ×2
LABEL OR SOLS (LABEL) ×2 IMPLANT
MANIFOLD NEPTUNE II (INSTRUMENTS) ×2 IMPLANT
NEEDLE HYPO 22GX1.5 SAFETY (NEEDLE) ×2 IMPLANT
NS IRRIG 500ML POUR BTL (IV SOLUTION) ×2 IMPLANT
PACK BASIN MINOR ARMC (MISCELLANEOUS) ×2 IMPLANT
RETRACTOR RING XSMALL (MISCELLANEOUS) ×1 IMPLANT
RTRCTR WOUND ALEXIS 13CM XS SH (MISCELLANEOUS) ×2
SCRUB CHG 4% DYNA-HEX 4OZ (MISCELLANEOUS) ×1 IMPLANT
SPONGE T-LAP 4X18 ~~LOC~~+RFID (SPONGE) ×2 IMPLANT
SUT CHROMIC 0 CT 1 (SUTURE) ×4 IMPLANT
SUT MNCRL 4-0 (SUTURE) ×1
SUT MNCRL 4-0 27XMFL (SUTURE) ×1
SUT VICRYL 0 AB UR-6 (SUTURE) ×2 IMPLANT
SUTURE MNCRL 4-0 27XMF (SUTURE) ×1 IMPLANT
SYR 10ML LL (SYRINGE) ×2 IMPLANT
WATER STERILE IRR 500ML POUR (IV SOLUTION) ×2 IMPLANT

## 2021-07-07 NOTE — Progress Notes (Signed)
Patient seen in preoperative area. Uterine fundus is difficult to palpate. Central obesity. Will plan for interval laparoscopic tubal. Note sent to surgical scheduler.   Adrian Prows MD, [facility] 07/07/2021 9:53 AM

## 2021-07-07 NOTE — Anesthesia Postprocedure Evaluation (Signed)
Anesthesia Post Note  Patient: Deborah Mccarthy  Procedure(s) Performed: AN AD HOC LABOR EPIDURAL  Patient location during evaluation: Mother Baby Anesthesia Type: Epidural Level of consciousness: awake and alert Pain management: pain level controlled Vital Signs Assessment: post-procedure vital signs reviewed and stable Respiratory status: spontaneous breathing, nonlabored ventilation and respiratory function stable Cardiovascular status: stable Postop Assessment: no headache, no backache and epidural receding Anesthetic complications: no   No notable events documented.   Last Vitals:  Vitals:   07/07/21 0745 07/07/21 0934  BP: 105/63 103/69  Pulse: 76 72  Resp: 18 20  Temp: 36.7 C 36.8 C  SpO2: 100% 98%    Last Pain:  Vitals:   07/07/21 0934  TempSrc: Temporal  PainSc:                  Hedda Slade

## 2021-07-07 NOTE — Progress Notes (Signed)
Patient d/c home with infant. D/c instructions, Rx, and f/u appt given to and reviewed with pt. Pt verbalized understanding. Escorted out by auxillary.

## 2021-07-10 ENCOUNTER — Encounter: Payer: Managed Care, Other (non HMO) | Admitting: Advanced Practice Midwife

## 2021-07-10 ENCOUNTER — Telehealth: Payer: Self-pay

## 2021-07-10 NOTE — Telephone Encounter (Signed)
Called patient to schedule xi robot assisted laparoscopic bilateral salpingectomy w Gilman Schmidt  DOS 08/11/21  H&P 3/16 @ 1:15   Pre-admit phone call appointment to be requested - date and time will be included on H&P paper work. Also all appointments will be updated on pt MyChart. Explained that this appointment has a call window. Based on the time scheduled will indicate if the call will be received within a 4 hour window before 1:00 or after.  Advised that pt may also receive calls from the hospital pharmacy and pre-service center.  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 No secondary insurance.

## 2021-07-10 NOTE — Anesthesia Preprocedure Evaluation (Signed)
Anesthesia Evaluation  Patient identified by MRN, date of birth, ID band Patient awake    Reviewed: Allergy & Precautions, NPO status , Patient's Chart, lab work & pertinent test results  History of Anesthesia Complications Negative for: history of anesthetic complications  Airway Mallampati: III   Neck ROM: Full    Dental no notable dental hx.    Pulmonary former smoker (quit 10/2020),    Pulmonary exam normal breath sounds clear to auscultation       Cardiovascular Exercise Tolerance: Good negative cardio ROS Normal cardiovascular exam Rhythm:Regular Rate:Normal     Neuro/Psych negative neurological ROS     GI/Hepatic negative GI ROS,   Endo/Other  diabetes (gestational)Obesity   Renal/GU negative Renal ROS     Musculoskeletal   Abdominal   Peds  Hematology negative hematology ROS (+)   Anesthesia Other Findings   Reproductive/Obstetrics                             Anesthesia Physical Anesthesia Plan  ASA: 2  Anesthesia Plan: Epidural   Post-op Pain Management:    Induction:   PONV Risk Score and Plan: 2 and Treatment may vary due to age or medical condition  Airway Management Planned: Natural Airway  Additional Equipment:   Intra-op Plan:   Post-operative Plan:   Informed Consent: I have reviewed the patients History and Physical, chart, labs and discussed the procedure including the risks, benefits and alternatives for the proposed anesthesia with the patient or authorized representative who has indicated his/her understanding and acceptance.     Dental Advisory Given  Plan Discussed with:   Anesthesia Plan Comments: (Patient reports no bleeding problems and no anticoagulant use.   Patient consented for risks of anesthesia including but not limited to:  - adverse reactions to medications - risk of bleeding, infection and or nerve damage from epidural that  could lead to paralysis - risk of headache or failed epidural - nerve damage due to positioning - that if epidural is used for C-section that there is a chance of epidural failure requiring spinal placement or conversion to GA - Damage to heart, brain, lungs, other parts of body or loss of life  Patient voiced understanding.)        Anesthesia Quick Evaluation

## 2021-07-10 NOTE — Telephone Encounter (Signed)
-----   Message from Homero Fellers, MD sent at 07/07/2021  9:53 AM EST ----- Regarding: Interval tubal Surgery Booking Request Patient Full Name:  Deborah Mccarthy  MRN: [id number]  DOB: Sep 19, 1980  Surgeon: Homero Fellers, MD  Requested Surgery Date and Time: late March Primary Diagnosis AND Code: desires sterilization Secondary Diagnosis and Code:  Surgical Procedure: xi- robot assisted laparoscopic bilateral salpingectomy RNFA Requested?: No L&D Notification: No Admission Status: same day surgery Length of Surgery: 50 min Special Case Needs: No H&P: Yes Phone Interview???:  Yes Interpreter: No Medical Clearance:  No Special Scheduling Instructions: No Any known health/anesthesia issues, diabetes, sleep apnea, latex allergy, defibrillator/pacemaker?: No Acuity: P3   (P1 highest, P2 delay may cause harm, P3 low, elective gyn, P4 lowest) Post op follow up visits: 1 week post op   She was supposed to have a postpartum tubal today but not a good candidate with central obesity. Please plan interval tubal.

## 2021-07-10 NOTE — Telephone Encounter (Signed)
Orders placed.

## 2021-07-14 ENCOUNTER — Other Ambulatory Visit: Payer: Managed Care, Other (non HMO)

## 2021-07-14 ENCOUNTER — Other Ambulatory Visit: Payer: Self-pay

## 2021-07-14 DIAGNOSIS — O24415 Gestational diabetes mellitus in pregnancy, controlled by oral hypoglycemic drugs: Secondary | ICD-10-CM

## 2021-07-15 LAB — GLUCOSE TOLERANCE, 2 HOURS
Glucose, 2 hour: 79 mg/dL (ref 70–139)
Glucose, GTT - Fasting: 88 mg/dL (ref 70–99)

## 2021-07-22 ENCOUNTER — Other Ambulatory Visit: Payer: Self-pay

## 2021-07-30 ENCOUNTER — Encounter: Payer: Managed Care, Other (non HMO) | Admitting: Obstetrics and Gynecology

## 2021-08-06 ENCOUNTER — Ambulatory Visit (INDEPENDENT_AMBULATORY_CARE_PROVIDER_SITE_OTHER): Payer: Managed Care, Other (non HMO) | Admitting: Advanced Practice Midwife

## 2021-08-06 ENCOUNTER — Encounter: Payer: Self-pay | Admitting: Obstetrics and Gynecology

## 2021-08-06 ENCOUNTER — Encounter: Payer: Self-pay | Admitting: Advanced Practice Midwife

## 2021-08-06 ENCOUNTER — Ambulatory Visit (INDEPENDENT_AMBULATORY_CARE_PROVIDER_SITE_OTHER): Payer: Managed Care, Other (non HMO) | Admitting: Obstetrics and Gynecology

## 2021-08-06 ENCOUNTER — Other Ambulatory Visit: Payer: Self-pay

## 2021-08-06 VITALS — BP 120/70 | Ht 61.0 in | Wt 183.0 lb

## 2021-08-06 DIAGNOSIS — R875 Abnormal microbiological findings in specimens from female genital organs: Secondary | ICD-10-CM

## 2021-08-06 DIAGNOSIS — Z3009 Encounter for other general counseling and advice on contraception: Secondary | ICD-10-CM

## 2021-08-06 DIAGNOSIS — N771 Vaginitis, vulvitis and vulvovaginitis in diseases classified elsewhere: Secondary | ICD-10-CM | POA: Diagnosis not present

## 2021-08-06 DIAGNOSIS — L989 Disorder of the skin and subcutaneous tissue, unspecified: Secondary | ICD-10-CM

## 2021-08-06 MED ORDER — AMOXICILLIN-POT CLAVULANATE 875-125 MG PO TABS: 1.0000 | ORAL_TABLET | Freq: Two times a day (BID) | ORAL | 0 refills | Status: AC

## 2021-08-06 NOTE — Progress Notes (Signed)
? ?Patient ID: Deborah Mccarthy, female   DOB: Aug 28, 1980, 41 y.o.   MRN: [id number] ? ?Reason for Consult: Pre-op Exam ?  ?Referred by Pleas Koch, NP ? ?Subjective:  ?   ?HPI: ? ?Deborah Mccarthy is a 41 y.o. female she is here for her preoperative visit for sterilization.  She reports that she would like to have her bilateral fallopian tubes removed. She has been noticing symptoms of prolapse.  She sometimes feels a bulge or pressure between her legs.  She has had increasing difficulty with bowel movements and constipation.  These are new symptoms since her delivery. ? ?Gynecological History ? ?No LMP recorded. ? ?Past Medical History:  ?Diagnosis Date  ? Abnormal Pap smear of vagina   ? BV (bacterial vaginosis)   ? Cervical dysplasia   ? Chlamydia   ? Depression   ? Dysmenorrhea   ? Gestational diabetes   ? Hives   ? Menorrhagia   ? Ovarian cyst   ? Rectal ulcer   ? Negative for Crohn's  ? Vitamin D deficiency   ? ?Family History  ?Problem Relation Age of Onset  ? Graves' disease Mother   ? Coronary artery disease Father   ? Diabetes Father   ? Breast cancer Maternal Grandmother 20  ? Coronary artery disease Maternal Grandmother   ? Colon cancer Maternal Grandfather   ? Colon cancer Other   ? ?Past Surgical History:  ?Procedure Laterality Date  ? CRYOTHERAPY    ? FINGER SURGERY    ? PILONIDAL CYST EXCISION    ? WISDOM TOOTH EXTRACTION    ? ? ?Short Social History:  ?Social History  ? ?Tobacco Use  ? Smoking status: Former  ?  Packs/day: 1.00  ?  Years: 15.00  ?  Pack years: 15.00  ?  Types: Cigarettes  ?  Quit date: 10/22/2020  ?  Years since quitting: 0.7  ? Smokeless tobacco: Never  ?Substance Use Topics  ? Alcohol use: No  ?  Alcohol/week: 0.0 standard drinks  ? ? ?No Known Allergies ? ?Current Outpatient Medications  ?Medication Sig Dispense Refill  ? Prenatal Vit-Fe Fumarate-FA (MULTIVITAMIN-PRENATAL) 27-0.8 MG TABS tablet Take 1 tablet by mouth daily at 12 noon.    ? ibuprofen (ADVIL) 600 MG tablet Take  1 tablet (600 mg total) by mouth every 6 (six) hours. (Patient not taking: Reported on 08/06/2021) 30 tablet 0  ? ?No current facility-administered medications for this visit.  ? ? ?Review of Systems  ?Constitutional: Negative for chills, fatigue, fever and unexpected weight change.  ?HENT: Negative for trouble swallowing.  ?Eyes: Negative for loss of vision.  ?Respiratory: Negative for cough, shortness of breath and wheezing.  ?Cardiovascular: Negative for chest pain, leg swelling, palpitations and syncope.  ?GI: Negative for abdominal pain, blood in stool, diarrhea, nausea and vomiting.  ?GU: Negative for difficulty urinating, dysuria, frequency and hematuria.  ?Musculoskeletal: Negative for back pain, leg pain and joint pain.  ?Skin: Negative for rash.  ?Neurological: Negative for dizziness, headaches, light-headedness, numbness and seizures.  ?Psychiatric: Negative for behavioral problem, confusion, depressed mood and sleep disturbance.   ? ?   ?Objective:  ?Objective  ? ?Vitals:  ? 08/06/21 1314  ?BP: 120/70  ?Weight: 183 lb (83 kg)  ?Height: '5\' 1"'$  (1.549 m)  ? ?Body mass index is 34.58 kg/m?. ? ?Physical Exam ?Vitals and nursing note reviewed. Exam conducted with a chaperone present.  ?Constitutional:   ?   Appearance: Normal appearance.  ?HENT:  ?  Head: Normocephalic and atraumatic.  ?Eyes:  ?   Extraocular Movements: Extraocular movements intact.  ?   Pupils: Pupils are equal, round, and reactive to light.  ?Cardiovascular:  ?   Rate and Rhythm: Normal rate and regular rhythm.  ?Pulmonary:  ?   Effort: Pulmonary effort is normal.  ?   Breath sounds: Normal breath sounds.  ?Abdominal:  ?   General: Abdomen is flat.  ?   Palpations: Abdomen is soft.  ?Genitourinary: ?   Comments: External: Normal appearing vulva. No lesions noted.  ?Speculum examination: Normal appearing cervix. No blood in the vaginal vault. Copious yellow discharge.  Noted stage II rectocele.  Minimal anterior and apical  prolapse. ?Musculoskeletal:  ?   Cervical back: Normal range of motion.  ?Skin: ?   General: Skin is warm and dry.  ?Neurological:  ?   General: No focal deficit present.  ?   Mental Status: She is alert and oriented to person, place, and time.  ?Psychiatric:     ?   Behavior: Behavior normal.     ?   Thought Content: Thought content normal.     ?   Judgment: Judgment normal.  ? ? ?Assessment/Plan:  ?  ? ?41 y.o. [id number] ? ?Patient reports that she desires sterilization. She feels strongly that she does not desire children in the future.   We discussed alternative options for sterilization including long-acting reversible contraception methods. We discussed that sterilization procedures have a failure rate of approximately 1 in  1000.  We discussed potential surgical complications of sterile sterilization including infection damage to surrounding pelvic tissues and risk of bleeding.We discussed that sterilization procedure should be considered on reversible.  Having a reversal surgery to correct a tubal ligation is expensive, risks and ectopic pregnancy, and has high failure rates. ? ?We discussed options of performing a tubal ligation including removing a segment of the fallopian tube or removing an entire fallopian tube.  We discussed that removal of entire fallopian tube has been associated with a reduction in ovarian cancer risk, however this reduction in is small in her lifetime risk of ovarian cancer is approximately 1 in 100.  ? ?Patient feels sure of her decision to have a sterilization procedure.  She desires to have a laparoscopic bilateral salpingectomy.   ? ?Referral to urogynecology for further evaluation of stage II rectocele placed.  Patient provided with AUGS handouts regarding prolapse and Kegel exercises. ? ?Nu swab and Anaerobic Aerobic swab for yellow purulent discharge.  We will start the patient on Augmentin for suspected pelvic infection. ? ?More than 20 minutes were spent face to face with  the patient in the room, reviewing the medical record, labs and images, and coordinating care for the patient. The plan of management was discussed in detail and counseling was provided.  ? ? ?Adrian Prows MD ?[facility], [facility] ?08/06/2021 ?1:34 PM ? ? ?

## 2021-08-06 NOTE — H&P (View-Only) (Signed)
? ?Patient ID: Deborah Mccarthy, female   DOB: 20-Jan-1981, 41 y.o.   MRN: [id number] ? ?Reason for Consult: Pre-op Exam ?  ?Referred by Pleas Koch, NP ? ?Subjective:  ?   ?HPI: ? ?Deborah Mccarthy is a 41 y.o. female she is here for her preoperative visit for sterilization.  She reports that she would like to have her bilateral fallopian tubes removed. She has been noticing symptoms of prolapse.  She sometimes feels a bulge or pressure between her legs.  She has had increasing difficulty with bowel movements and constipation.  These are new symptoms since her delivery. ? ?Gynecological History ? ?No LMP recorded. ? ?Past Medical History:  ?Diagnosis Date  ? Abnormal Pap smear of vagina   ? BV (bacterial vaginosis)   ? Cervical dysplasia   ? Chlamydia   ? Depression   ? Dysmenorrhea   ? Gestational diabetes   ? Hives   ? Menorrhagia   ? Ovarian cyst   ? Rectal ulcer   ? Negative for Crohn's  ? Vitamin D deficiency   ? ?Family History  ?Problem Relation Age of Onset  ? Graves' disease Mother   ? Coronary artery disease Father   ? Diabetes Father   ? Breast cancer Maternal Grandmother 48  ? Coronary artery disease Maternal Grandmother   ? Colon cancer Maternal Grandfather   ? Colon cancer Other   ? ?Past Surgical History:  ?Procedure Laterality Date  ? CRYOTHERAPY    ? FINGER SURGERY    ? PILONIDAL CYST EXCISION    ? WISDOM TOOTH EXTRACTION    ? ? ?Short Social History:  ?Social History  ? ?Tobacco Use  ? Smoking status: Former  ?  Packs/day: 1.00  ?  Years: 15.00  ?  Pack years: 15.00  ?  Types: Cigarettes  ?  Quit date: 10/22/2020  ?  Years since quitting: 0.7  ? Smokeless tobacco: Never  ?Substance Use Topics  ? Alcohol use: No  ?  Alcohol/week: 0.0 standard drinks  ? ? ?No Known Allergies ? ?Current Outpatient Medications  ?Medication Sig Dispense Refill  ? Prenatal Vit-Fe Fumarate-FA (MULTIVITAMIN-PRENATAL) 27-0.8 MG TABS tablet Take 1 tablet by mouth daily at 12 noon.    ? ibuprofen (ADVIL) 600 MG tablet Take  1 tablet (600 mg total) by mouth every 6 (six) hours. (Patient not taking: Reported on 08/06/2021) 30 tablet 0  ? ?No current facility-administered medications for this visit.  ? ? ?Review of Systems  ?Constitutional: Negative for chills, fatigue, fever and unexpected weight change.  ?HENT: Negative for trouble swallowing.  ?Eyes: Negative for loss of vision.  ?Respiratory: Negative for cough, shortness of breath and wheezing.  ?Cardiovascular: Negative for chest pain, leg swelling, palpitations and syncope.  ?GI: Negative for abdominal pain, blood in stool, diarrhea, nausea and vomiting.  ?GU: Negative for difficulty urinating, dysuria, frequency and hematuria.  ?Musculoskeletal: Negative for back pain, leg pain and joint pain.  ?Skin: Negative for rash.  ?Neurological: Negative for dizziness, headaches, light-headedness, numbness and seizures.  ?Psychiatric: Negative for behavioral problem, confusion, depressed mood and sleep disturbance.   ? ?   ?Objective:  ?Objective  ? ?Vitals:  ? 08/06/21 1314  ?BP: 120/70  ?Weight: 183 lb (83 kg)  ?Height: '5\' 1"'$  (1.549 m)  ? ?Body mass index is 34.58 kg/m?. ? ?Physical Exam ?Vitals and nursing note reviewed. Exam conducted with a chaperone present.  ?Constitutional:   ?   Appearance: Normal appearance.  ?HENT:  ?  Head: Normocephalic and atraumatic.  ?Eyes:  ?   Extraocular Movements: Extraocular movements intact.  ?   Pupils: Pupils are equal, round, and reactive to light.  ?Cardiovascular:  ?   Rate and Rhythm: Normal rate and regular rhythm.  ?Pulmonary:  ?   Effort: Pulmonary effort is normal.  ?   Breath sounds: Normal breath sounds.  ?Abdominal:  ?   General: Abdomen is flat.  ?   Palpations: Abdomen is soft.  ?Genitourinary: ?   Comments: External: Normal appearing vulva. No lesions noted.  ?Speculum examination: Normal appearing cervix. No blood in the vaginal vault. Copious yellow discharge.  Noted stage II rectocele.  Minimal anterior and apical  prolapse. ?Musculoskeletal:  ?   Cervical back: Normal range of motion.  ?Skin: ?   General: Skin is warm and dry.  ?Neurological:  ?   General: No focal deficit present.  ?   Mental Status: She is alert and oriented to person, place, and time.  ?Psychiatric:     ?   Behavior: Behavior normal.     ?   Thought Content: Thought content normal.     ?   Judgment: Judgment normal.  ? ? ?Assessment/Plan:  ?  ? ?41 y.o. [id number] ? ?Patient reports that she desires sterilization. She feels strongly that she does not desire children in the future.   We discussed alternative options for sterilization including long-acting reversible contraception methods. We discussed that sterilization procedures have a failure rate of approximately 1 in  1000.  We discussed potential surgical complications of sterile sterilization including infection damage to surrounding pelvic tissues and risk of bleeding.We discussed that sterilization procedure should be considered on reversible.  Having a reversal surgery to correct a tubal ligation is expensive, risks and ectopic pregnancy, and has high failure rates. ? ?We discussed options of performing a tubal ligation including removing a segment of the fallopian tube or removing an entire fallopian tube.  We discussed that removal of entire fallopian tube has been associated with a reduction in ovarian cancer risk, however this reduction in is small in her lifetime risk of ovarian cancer is approximately 1 in 100.  ? ?Patient feels sure of her decision to have a sterilization procedure.  She desires to have a laparoscopic bilateral salpingectomy.   ? ?Referral to urogynecology for further evaluation of stage II rectocele placed.  Patient provided with AUGS handouts regarding prolapse and Kegel exercises. ? ?Nu swab and Anaerobic Aerobic swab for yellow purulent discharge.  We will start the patient on Augmentin for suspected pelvic infection. ? ?More than 20 minutes were spent face to face with  the patient in the room, reviewing the medical record, labs and images, and coordinating care for the patient. The plan of management was discussed in detail and counseling was provided.  ? ? ?Adrian Prows MD ?[facility], [facility] ?08/06/2021 ?1:34 PM ? ? ?

## 2021-08-07 ENCOUNTER — Inpatient Hospital Stay: Admission: RE | Admit: 2021-08-07 | Payer: Managed Care, Other (non HMO) | Source: Ambulatory Visit

## 2021-08-07 ENCOUNTER — Encounter
Admission: RE | Admit: 2021-08-07 | Discharge: 2021-08-07 | Disposition: A | Discharge status: Home / Self Care | Payer: Managed Care, Other (non HMO) | Source: Ambulatory Visit | Attending: Obstetrics and Gynecology | Admitting: Obstetrics and Gynecology

## 2021-08-07 ENCOUNTER — Encounter: Payer: Self-pay | Admitting: Advanced Practice Midwife

## 2021-08-07 ENCOUNTER — Other Ambulatory Visit: Payer: Self-pay

## 2021-08-07 DIAGNOSIS — O24419 Gestational diabetes mellitus in pregnancy, unspecified control: Secondary | ICD-10-CM

## 2021-08-07 NOTE — Patient Instructions (Signed)
Your procedure is scheduled on:08-11-21 Tuesday ?Report to the Registration Desk on the 1st floor of the [facility].Then proceed to the 2nd floor Surgery Desk in the [facility]  ?To find out your arrival time, please call [phone] between 1PM - 3PM on:08-10-21 Monday ? ?REMEMBER: ?Instructions that are not followed completely may result in serious medical risk, up to and including death; or upon the discretion of your surgeon and anesthesiologist your surgery may need to be rescheduled. ? ?Do not eat food after midnight the night before surgery.  ?No gum chewing, lozengers or hard candies. ? ?You may however, drink CLEAR liquids up to 2 hours before you are scheduled to arrive for your surgery. Do not drink anything within 2 hours of your scheduled arrival time. ? ?Clear liquids include: ?- water  ?- apple juice without pulp ?- gatorade (not RED colors) ?- black coffee or tea (Do NOT add milk or creamers to the coffee or tea) ?Do NOT drink anything that is not on this list. ? ?In addition, your doctor has ordered for you to drink the provided  ?Ensure Pre-Surgery Clear Carbohydrate Drink  ?Drinking this carbohydrate drink up to two hours before surgery helps to reduce insulin resistance and improve patient outcomes. Please complete drinking 2 hours prior to scheduled arrival time. ? ?Do NOT take any medications the day of surgery ? ?One week prior to surgery: ?Stop Anti-inflammatories (NSAIDS) such as Advil, Aleve, Ibuprofen, Motrin, Naproxen, Naprosyn and Aspirin based products such as Excedrin, Goodys Powder, BC Powder.You may however, take Tylenol if needed for pain up until the day of surgery. ? ?Stop ANY OVER THE COUNTER supplements/vitamins NOW (08-07-21) until after surgery (Prenatal Vitamin) ? ?No Alcohol for 24 hours before or after surgery. ? ?No Smoking including e-cigarettes for 24 hours prior to surgery.  ?No chewable tobacco products for at least 6 hours prior to surgery.  ?No nicotine patches  on the day of surgery. ? ?Do not use any "recreational" drugs for at least a week prior to your surgery.  ?Please be advised that the combination of cocaine and anesthesia may have negative outcomes, up to and including death. ?If you test positive for cocaine, your surgery will be cancelled. ? ?On the morning of surgery brush your teeth with toothpaste and water, you may rinse your mouth with mouthwash if you wish. ?Do not swallow any toothpaste or mouthwash. ? ?Use CHG Soap as directed on instruction sheet. ? ?Do not wear jewelry, make-up, hairpins, clips or nail polish. ? ?Do not wear lotions, powders, or perfumes.  ? ?Do not shave body from the neck down 48 hours prior to surgery just in case you cut yourself which could leave a site for infection.  ?Also, freshly shaved skin may become irritated if using the CHG soap. ? ?Contact lenses, hearing aids and dentures may not be worn into surgery. ? ?Do not bring valuables to the hospital. [facility] is not responsible for any missing/lost belongings or valuables.  ? ?Notify your doctor if there is any change in your medical condition (cold, fever, infection). ? ?Wear comfortable clothing (specific to your surgery type) to the hospital. ? ?After surgery, you can help prevent lung complications by doing breathing exercises.  ?Take deep breaths and cough every 1-2 hours. Your doctor may order a device called an Incentive Spirometer to help you take deep breaths. ?When coughing or sneezing, hold a pillow firmly against your incision with both hands. This is called ?splinting.? Doing this  helps protect your incision. It also decreases belly discomfort. ? ?If you are being admitted to the hospital overnight, leave your suitcase in the car. ?After surgery it may be brought to your room. ? ?If you are being discharged the day of surgery, you will not be allowed to drive home. ?You will need a responsible adult (18 years or older) to drive you home and stay with you that  night.  ? ?If you are taking public transportation, you will need to have a responsible adult (18 years or older) with you. ?Please confirm with your physician that it is acceptable to use public transportation.  ? ?Please call the [facility] at [phone] if you have any questions about these instructions. ? ?Surgery Visitation Policy: ? ?Patients undergoing a surgery or procedure may have one family member or support person with them as long as that person is not COVID-19 positive or experiencing its symptoms.  ?That person may remain in the waiting area during the procedure and may rotate out with other people ?

## 2021-08-07 NOTE — Progress Notes (Addendum)
? ? ? ?Postpartum Visit  ?Date of Service: 08/06/2021 ? ?Chief Complaint:  ?Chief Complaint  ?Patient presents with  ? Postpartum Care  ? ? ?History of Present Illness: Patient is a 41 y.o. [id number] presents for postpartum visit. ?She was seen earlier today for pre-surgical visit with Dr Gilman Schmidt.  ? ?Review the Delivery Report for details.  ? ?Date of delivery: 07/06/2021 ?Type of delivery: Vaginal delivery - Vacuum or forceps assisted  no ?Episiotomy No.  ?Laceration: no  ?Pregnancy or labor problems:  gestational DM A2 metformin ?Any problems since the delivery:  mild postpartum adjustment issues. She mentions a bump on her buttock that is new with this pregnancy. She has a history of a growth in same area following a previous pregnancy that she reports was a wart. 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 ? ?Newborn Details:  ?SINGLETON :  ?1. BabyGender female. Birth weight: 6 pounds 3 ounces ?Maternal Details:  ?Breast or formula feeding: Formula ?Intercourse: No  ?Contraception after delivery:  BTL scheduled 08/11/21 ?Any bowel or bladder issues:  Stage II Rectocele per Dr Gilman Schmidt ?Post partum depression/anxiety noted:  mild- limited support system/lack of sleep ? ?Date of last PAP: 2021  no abnormalities  ? ?Review of Systems: Review of Systems  ?Constitutional:  Negative for chills and fever.  ?HENT:  Negative for congestion, ear discharge, ear pain, hearing loss, sinus pain and sore throat.   ?Eyes:  Negative for blurred vision and double vision.  ?Respiratory:  Negative for cough, shortness of breath and wheezing.   ?Cardiovascular:  Negative for chest pain, palpitations and leg swelling.  ?Gastrointestinal:  Positive for constipation. Negative for abdominal pain, blood in stool, diarrhea, heartburn, melena, nausea and vomiting.  ?Genitourinary:  Negative for dysuria, flank pain, frequency,  hematuria and urgency.  ?     Positive for vaginal discharge  ?Musculoskeletal:  Negative for back pain, joint pain and myalgias.  ?Skin:  Negative for itching and rash.  ?     Positive for skin growth on buttock  ?Neurological:  Negative for dizziness, tingling, tremors, sensory change, speech change, focal weakness, seizures, loss of consciousness, weakness and headaches.  ?Endo/Heme/Allergies:  Negative for environmental allergies. Does not bruise/bleed easily.  ?Psychiatric/Behavioral:  Negative for depression, hallucinations, memory loss, substance abuse and suicidal ideas. The patient is not nervous/anxious and does not have insomnia.   ?     Positive for mild postpartum depression/adjustment with newborn   ? ? ?Past Medical History:  ?Past Medical History:  ?Diagnosis Date  ? Abnormal Pap smear of vagina   ? BV (bacterial vaginosis)   ? Cervical dysplasia   ? Chlamydia   ? Depression   ? Dysmenorrhea   ? Gestational diabetes   ? Hives   ? Menorrhagia   ? Ovarian cyst   ? Rectal ulcer   ? Negative for Crohn's  ? Vitamin D deficiency   ? ? ?Past Surgical History:  ?Past Surgical History:  ?Procedure Laterality Date  ? CRYOTHERAPY    ? FINGER SURGERY    ? PILONIDAL CYST EXCISION    ? WISDOM TOOTH EXTRACTION    ? ? ?Family History:  ?Family History  ?Problem Relation Age of Onset  ? Graves' disease Mother   ? Coronary artery disease Father   ? Diabetes Father   ? Breast cancer Maternal Grandmother 29  ? Coronary artery disease Maternal Grandmother   ?  Colon cancer Maternal Grandfather   ? Colon cancer Other   ? ? ?Social History:  ?Social History  ? ?Socioeconomic History  ? Marital status: Married  ?  Spouse name: Not on file  ? Number of children: 3  ? Years of education: Not on file  ? Highest education level: Not on file  ?Occupational History  ? Not on file  ?Tobacco Use  ? Smoking status: Former  ?  Packs/day: 1.00  ?  Years: 15.00  ?  Pack years: 15.00  ?  Types: Cigarettes  ?  Quit date: 10/22/2020  ?   Years since quitting: 0.7  ? Smokeless tobacco: Never  ?Vaping Use  ? Vaping Use: Never used  ?Substance and Sexual Activity  ? Alcohol use: No  ?  Alcohol/week: 0.0 standard drinks  ? Drug use: No  ? Sexual activity: Yes  ?  Birth control/protection: Surgical  ?Other Topics Concern  ? Not on file  ?Social History Narrative  ? Married.  ? 2 children.  ? Works as a Education officer, museum.  ? Enjoys reading, gardening, relaxing.   ? ?Social Determinants of Health  ? ?Financial Resource Strain: Not on file  ?Food Insecurity: Not on file  ?Transportation Needs: Not on file  ?Physical Activity: Not on file  ?Stress: Not on file  ?Social Connections: Not on file  ?Intimate Partner Violence: Not on file  ? ? ?Allergies:  ?No Known Allergies ? ?Medications: ?Prior to Admission medications   ?Medication Sig Start Date End Date Taking? Authorizing Provider  ?amoxicillin-clavulanate (AUGMENTIN) 875-125 MG tablet Take 1 tablet by mouth 2 (two) times daily for 10 days. 08/06/21 08/16/21 Yes Schuman, Stefanie Libel, MD  ?ibuprofen (ADVIL) 600 MG tablet Take 1 tablet (600 mg total) by mouth every 6 (six) hours. 07/07/21  Yes Imagene Riches, CNM  ?Prenatal Vit-Fe Fumarate-FA (MULTIVITAMIN-PRENATAL) 27-0.8 MG TABS tablet Take 1 tablet by mouth daily at 12 noon.   Yes [provider]  ? ? ?Physical Exam ?Blood pressure 100/60, height '5\' 1"'$  (1.549 m), weight 183 lb (83 kg), not currently breastfeeding. ?  ? ?General: NAD ?HEENT: normocephalic, anicteric ?Pulmonary: No increased work of breathing ?Abdomen: NABS, soft, non-tender, non-distended.  Umbilicus without lesions.  No hepatomegaly, splenomegaly or masses palpable. No evidence of hernia. ?Genitourinary: exam limited to assessment of lesion on right buttock. Thorough genitourinary exam done earlier today by Dr Gilman Schmidt. ?Growth on right buttock appears to be 1 cm slightly raised mole with black center and brownish red borders, slightly irregular borders. Dr Gilman Schmidt also examined the  area and agrees that it is likely a mole and treatment by dermatology is recommended.  ?Extremities: no edema, erythema, or tenderness ?Neurologic: Grossly intact ?Psychiatric: mood appropriate, affect full ? ? ?GAD 7 : Generalized Anxiety Score 08/06/2021  ?Nervous, Anxious, on Edge 1  ?Control/stop worrying 0  ?Worry too much - different things 1  ?Trouble relaxing 1  ?Restless 0  ?Easily annoyed or irritable 1  ?Afraid - awful might happen 0  ?Total GAD 7 Score 4  ?Anxiety Difficulty Not difficult at all  ? ?  ?Flowsheet Row Postpartum Visit from [facility]/06/[facility] in [facility]  ?PHQ-9 Total Score 6  ? ?  ?  ? ?Assessment: 41 y.o. [id number] presenting for 6 week postpartum visit ? ?Plan: ?Problem List Items Addressed This Visit   ?None ?Visit Diagnoses   ? ? 6 weeks postpartum follow-up    -  Primary  ? ?  ? ? ?  1) Contraception ?- Education given regarding options for contraception, as well as compatibility with breast feeding if applicable.  Patient plans on tubal ligation for contraception. ? ?2)  Pap ?- ASCCP guidelines and rationale discussed.   Patient opts for every 3 years screening interval ? ?3) Patient underwent screening for postpartum depression with concern for mild depression- she denies need for medication/therapy ? ?4) Lesion on right buttock: referral sent to dermatology for treatment ? ?5) Return in about 1 year (around 08/07/2022) for annual established gyn. ? ? ?Rod Can, CNM ?[facility] ?[facility] ?08/07/2021, 9:43 AM ? ? ?  ?

## 2021-08-09 LAB — GENITAL CULTURE

## 2021-08-09 LAB — SPECIMEN STATUS REPORT

## 2021-08-10 ENCOUNTER — Other Ambulatory Visit: Payer: Self-pay

## 2021-08-10 ENCOUNTER — Encounter
Admission: RE | Admit: 2021-08-10 | Discharge: 2021-08-10 | Disposition: A | Discharge status: Home / Self Care | Payer: Managed Care, Other (non HMO) | Source: Ambulatory Visit | Attending: Obstetrics and Gynecology | Admitting: Obstetrics and Gynecology

## 2021-08-10 ENCOUNTER — Other Ambulatory Visit: Payer: Self-pay | Admitting: Obstetrics and Gynecology

## 2021-08-10 ENCOUNTER — Encounter: Payer: Self-pay | Admitting: Urgent Care

## 2021-08-10 DIAGNOSIS — Z3009 Encounter for other general counseling and advice on contraception: Secondary | ICD-10-CM

## 2021-08-10 DIAGNOSIS — Z3A00 Weeks of gestation of pregnancy not specified: Secondary | ICD-10-CM | POA: Diagnosis not present

## 2021-08-10 DIAGNOSIS — O24419 Gestational diabetes mellitus in pregnancy, unspecified control: Secondary | ICD-10-CM

## 2021-08-10 DIAGNOSIS — Z01812 Encounter for preprocedural laboratory examination: Secondary | ICD-10-CM | POA: Diagnosis present

## 2021-08-10 DIAGNOSIS — A5909 Other urogenital trichomoniasis: Secondary | ICD-10-CM

## 2021-08-10 LAB — BASIC METABOLIC PANEL
Anion gap: 7 (ref 5–15)
BUN: 8 mg/dL (ref 6–20)
CO2: 27 mmol/L (ref 22–32)
Calcium: 9.2 mg/dL (ref 8.9–10.3)
Chloride: 105 mmol/L (ref 98–111)
Creatinine, Ser: 0.71 mg/dL (ref 0.44–1.00)
GFR, Estimated: >60 mL/min (ref >60)
Glucose, Bld: 113 mg/dL — ABNORMAL HIGH (ref 70–99)
Potassium: 3.8 mmol/L (ref 3.5–5.1)
Sodium: 139 mmol/L (ref 135–145)

## 2021-08-10 LAB — NUSWAB VAGINITIS PLUS (VG+)
Candida albicans, NAA: NEGATIVE
Candida glabrata, NAA: NEGATIVE
Chlamydia trachomatis, NAA: NEGATIVE
Neisseria gonorrhoeae, NAA: NEGATIVE
Trich vag by NAA: POSITIVE — AB

## 2021-08-10 MED ORDER — METRONIDAZOLE 500 MG PO TABS: ORAL_TABLET | ORAL | 0 refills | Status: DC

## 2021-08-11 ENCOUNTER — Ambulatory Visit: Payer: Managed Care, Other (non HMO)

## 2021-08-11 ENCOUNTER — Encounter
Admission: RE | Disposition: A | Discharge status: Home / Self Care | Payer: Self-pay | Source: Home / Self Care | Attending: Obstetrics and Gynecology

## 2021-08-11 ENCOUNTER — Other Ambulatory Visit: Payer: Self-pay

## 2021-08-11 ENCOUNTER — Encounter: Payer: Self-pay | Admitting: Obstetrics and Gynecology

## 2021-08-11 ENCOUNTER — Ambulatory Visit
Admission: RE | Admit: 2021-08-11 | Discharge: 2021-08-11 | Disposition: A | Discharge status: Home / Self Care | Payer: Managed Care, Other (non HMO) | Attending: Obstetrics and Gynecology | Admitting: Obstetrics and Gynecology

## 2021-08-11 DIAGNOSIS — N816 Rectocele: Secondary | ICD-10-CM | POA: Diagnosis not present

## 2021-08-11 DIAGNOSIS — Z302 Encounter for sterilization: Secondary | ICD-10-CM | POA: Diagnosis present

## 2021-08-11 DIAGNOSIS — Z3009 Encounter for other general counseling and advice on contraception: Secondary | ICD-10-CM

## 2021-08-11 DIAGNOSIS — N7001 Acute salpingitis: Secondary | ICD-10-CM | POA: Diagnosis not present

## 2021-08-11 DIAGNOSIS — Z87891 Personal history of nicotine dependence: Secondary | ICD-10-CM | POA: Insufficient documentation

## 2021-08-11 HISTORY — PX: XI ROBOTIC ASSISTED SALPINGECTOMY: SHX6824

## 2021-08-11 LAB — TYPE AND SCREEN
ABO/RH(D): A POS
Antibody Screen: NEGATIVE

## 2021-08-11 LAB — POCT PREGNANCY, URINE: Preg Test, Ur: NEGATIVE

## 2021-08-11 SURGERY — SALPINGECTOMY, ROBOT-ASSISTED
Anesthesia: General | Laterality: Bilateral

## 2021-08-11 MED ORDER — ROCURONIUM BROMIDE 100 MG/10ML IV SOLN
INTRAVENOUS | Status: DC | PRN
  Administered 2021-08-11: 50 mg via INTRAVENOUS
  Administered 2021-08-11: 20 mg via INTRAVENOUS

## 2021-08-11 MED ORDER — MIDAZOLAM HCL 2 MG/2ML IJ SOLN
INTRAMUSCULAR | Status: DC | PRN
  Administered 2021-08-11: 2 mg via INTRAVENOUS

## 2021-08-11 MED ORDER — FENTANYL CITRATE (PF) 100 MCG/2ML IJ SOLN
INTRAMUSCULAR | Status: AC
  Filled 2021-08-11: qty 2

## 2021-08-11 MED ORDER — DEXAMETHASONE SODIUM PHOSPHATE 10 MG/ML IJ SOLN
INTRAMUSCULAR | Status: AC
  Filled 2021-08-11: qty 2

## 2021-08-11 MED ORDER — CEFAZOLIN SODIUM-DEXTROSE 2-4 GM/100ML-% IV SOLN
INTRAVENOUS | Status: AC
  Filled 2021-08-11: qty 100

## 2021-08-11 MED ORDER — ORAL CARE MOUTH RINSE: 15.0000 mL | Freq: Once | OROMUCOSAL | Status: AC

## 2021-08-11 MED ORDER — LACTATED RINGERS IV SOLN: INTRAVENOUS | Status: DC

## 2021-08-11 MED ORDER — EPHEDRINE 5 MG/ML INJ
INTRAVENOUS | Status: AC
  Filled 2021-08-11: qty 5

## 2021-08-11 MED ORDER — BUPIVACAINE LIPOSOME 1.3 % IJ SUSP
INTRAMUSCULAR | Status: AC
Start: 2021-08-11 — End: ?
  Filled 2021-08-11: qty 20

## 2021-08-11 MED ORDER — 0.9 % SODIUM CHLORIDE (POUR BTL) OPTIME
TOPICAL | Status: DC | PRN
  Administered 2021-08-11: 500 mL

## 2021-08-11 MED ORDER — LIDOCAINE HCL (CARDIAC) PF 100 MG/5ML IV SOSY
PREFILLED_SYRINGE | INTRAVENOUS | Status: DC | PRN
  Administered 2021-08-11: 100 mg via INTRAVENOUS

## 2021-08-11 MED ORDER — ONDANSETRON HCL 4 MG/2ML IJ SOLN
INTRAMUSCULAR | Status: DC | PRN
  Administered 2021-08-11: 4 mg via INTRAVENOUS

## 2021-08-11 MED ORDER — PROPOFOL 10 MG/ML IV BOLUS
INTRAVENOUS | Status: DC | PRN
  Administered 2021-08-11: 160 mg via INTRAVENOUS

## 2021-08-11 MED ORDER — FAMOTIDINE 20 MG PO TABS
ORAL_TABLET | ORAL | Status: AC
  Administered 2021-08-11: 20 mg via ORAL
  Filled 2021-08-11: qty 1

## 2021-08-11 MED ORDER — FENTANYL CITRATE (PF) 100 MCG/2ML IJ SOLN
25.0000 ug | INTRAMUSCULAR | Status: DC | PRN
  Administered 2021-08-11: 25 ug via INTRAVENOUS

## 2021-08-11 MED ORDER — DEXMEDETOMIDINE (PRECEDEX) IN NS 20 MCG/5ML (4 MCG/ML) IV SYRINGE
PREFILLED_SYRINGE | INTRAVENOUS | Status: DC | PRN
  Administered 2021-08-11: 4 ug via INTRAVENOUS
  Administered 2021-08-11: 8 ug via INTRAVENOUS

## 2021-08-11 MED ORDER — FENTANYL CITRATE (PF) 100 MCG/2ML IJ SOLN
INTRAMUSCULAR | Status: DC | PRN
  Administered 2021-08-11: 50 ug via INTRAVENOUS
  Administered 2021-08-11 (×2): 25 ug via INTRAVENOUS

## 2021-08-11 MED ORDER — MIDAZOLAM HCL 2 MG/2ML IJ SOLN
INTRAMUSCULAR | Status: AC
  Filled 2021-08-11: qty 2

## 2021-08-11 MED ORDER — ACETAMINOPHEN 10 MG/ML IV SOLN
INTRAVENOUS | Status: DC | PRN
  Administered 2021-08-11: 1000 mg via INTRAVENOUS

## 2021-08-11 MED ORDER — METRONIDAZOLE IVPB CUSTOM
1000.0000 mg | Freq: Once | INTRAVENOUS | Status: DC
Start: 2021-08-11 — End: 2021-08-11
  Filled 2021-08-11: qty 200

## 2021-08-11 MED ORDER — SEVOFLURANE IN SOLN
RESPIRATORY_TRACT | Status: AC
  Filled 2021-08-11: qty 250

## 2021-08-11 MED ORDER — PROPOFOL 10 MG/ML IV BOLUS
INTRAVENOUS | Status: AC
  Filled 2021-08-11: qty 40

## 2021-08-11 MED ORDER — CHLORHEXIDINE GLUCONATE 0.12 % MT SOLN
OROMUCOSAL | Status: AC
  Administered 2021-08-11: 15 mL via OROMUCOSAL
  Filled 2021-08-11: qty 15

## 2021-08-11 MED ORDER — LIDOCAINE HCL (PF) 2 % IJ SOLN
INTRAMUSCULAR | Status: AC
  Filled 2021-08-11: qty 5

## 2021-08-11 MED ORDER — CEFAZOLIN SODIUM-DEXTROSE 2-4 GM/100ML-% IV SOLN
2.0000 g | INTRAVENOUS | Status: AC
  Administered 2021-08-11: 2 g via INTRAVENOUS

## 2021-08-11 MED ORDER — ONDANSETRON HCL 4 MG/2ML IJ SOLN
INTRAMUSCULAR | Status: AC
  Filled 2021-08-11: qty 2

## 2021-08-11 MED ORDER — GLYCOPYRROLATE 0.2 MG/ML IJ SOLN
INTRAMUSCULAR | Status: DC | PRN
  Administered 2021-08-11: .2 mg via INTRAVENOUS

## 2021-08-11 MED ORDER — KETAMINE HCL 50 MG/5ML IJ SOSY
PREFILLED_SYRINGE | INTRAMUSCULAR | Status: AC
  Filled 2021-08-11: qty 5

## 2021-08-11 MED ORDER — HEMOSTATIC AGENTS (NO CHARGE) OPTIME
TOPICAL | Status: DC | PRN
  Administered 2021-08-11: 1 via TOPICAL

## 2021-08-11 MED ORDER — DEXMEDETOMIDINE HCL IN NACL 200 MCG/50ML IV SOLN
INTRAVENOUS | Status: AC
  Filled 2021-08-11: qty 50

## 2021-08-11 MED ORDER — ACETAMINOPHEN 10 MG/ML IV SOLN
INTRAVENOUS | Status: AC
  Filled 2021-08-11: qty 100

## 2021-08-11 MED ORDER — PHENYLEPHRINE 40 MCG/ML (10ML) SYRINGE FOR IV PUSH (FOR BLOOD PRESSURE SUPPORT)
PREFILLED_SYRINGE | INTRAVENOUS | Status: AC
  Filled 2021-08-11: qty 10

## 2021-08-11 MED ORDER — SUGAMMADEX SODIUM 500 MG/5ML IV SOLN
INTRAVENOUS | Status: AC
  Filled 2021-08-11: qty 5

## 2021-08-11 MED ORDER — OXYCODONE HCL 5 MG PO TABS: 5.0000 mg | ORAL_TABLET | Freq: Three times a day (TID) | ORAL | 0 refills | Status: DC | PRN

## 2021-08-11 MED ORDER — BUPIVACAINE LIPOSOME 1.3 % IJ SUSP
INTRAMUSCULAR | Status: DC | PRN
  Administered 2021-08-11: 17 mL
  Administered 2021-08-11: 3 mL

## 2021-08-11 MED ORDER — DEXAMETHASONE SODIUM PHOSPHATE 10 MG/ML IJ SOLN
INTRAMUSCULAR | Status: DC | PRN
  Administered 2021-08-11: 5 mg via INTRAVENOUS

## 2021-08-11 MED ORDER — CHLORHEXIDINE GLUCONATE 0.12 % MT SOLN: 15.0000 mL | Freq: Once | OROMUCOSAL | Status: AC

## 2021-08-11 MED ORDER — FAMOTIDINE 20 MG PO TABS: 20.0000 mg | ORAL_TABLET | Freq: Once | ORAL | Status: AC

## 2021-08-11 MED ORDER — OXYCODONE HCL 5 MG PO TABS
ORAL_TABLET | ORAL | Status: DC
Start: 2021-08-11 — End: 2021-08-11
  Filled 2021-08-11: qty 1

## 2021-08-11 MED ORDER — PHENYLEPHRINE 40 MCG/ML (10ML) SYRINGE FOR IV PUSH (FOR BLOOD PRESSURE SUPPORT)
PREFILLED_SYRINGE | INTRAVENOUS | Status: DC | PRN
  Administered 2021-08-11: 80 ug via INTRAVENOUS
  Administered 2021-08-11: 40 ug via INTRAVENOUS

## 2021-08-11 MED ORDER — POVIDONE-IODINE 10 % EX SWAB
2.0000 "application " | Freq: Once | CUTANEOUS | Status: AC
  Administered 2021-08-11: 2 via TOPICAL

## 2021-08-11 MED ORDER — OXYCODONE HCL 5 MG/5ML PO SOLN: 5.0000 mg | Freq: Once | ORAL | Status: AC | PRN

## 2021-08-11 MED ORDER — SUGAMMADEX SODIUM 200 MG/2ML IV SOLN
INTRAVENOUS | Status: DC | PRN
Start: 2021-08-11 — End: 2021-08-11
  Administered 2021-08-11: 250 mg via INTRAVENOUS

## 2021-08-11 MED ORDER — OXYCODONE HCL 5 MG PO TABS
5.0000 mg | ORAL_TABLET | Freq: Once | ORAL | Status: AC | PRN
  Administered 2021-08-11: 5 mg via ORAL

## 2021-08-11 MED ORDER — METRONIDAZOLE 500 MG/100ML IV SOLN
1000.0000 mg | Freq: Once | INTRAVENOUS | Status: AC
  Administered 2021-08-11: 1000 mg via INTRAVENOUS
  Filled 2021-08-11: qty 200

## 2021-08-11 MED ORDER — KETAMINE HCL 10 MG/ML IJ SOLN
INTRAMUSCULAR | Status: DC | PRN
Start: 2021-08-11 — End: 2021-08-11
  Administered 2021-08-11: 30 mg via INTRAVENOUS
  Administered 2021-08-11: 20 mg via INTRAVENOUS

## 2021-08-11 MED ORDER — EPHEDRINE SULFATE (PRESSORS) 50 MG/ML IJ SOLN
INTRAMUSCULAR | Status: DC | PRN
  Administered 2021-08-11: 5 mg via INTRAVENOUS

## 2021-08-11 SURGICAL SUPPLY — 59 items
APPLICATOR ARISTA FLEXITIP XL (MISCELLANEOUS) ×1 IMPLANT
BACTOSHIELD CHG 4% 4OZ (MISCELLANEOUS) ×1
BAG INFUSER PRESSURE 100CC (MISCELLANEOUS) ×1 IMPLANT
BAG URINE DRAIN 2000ML AR STRL (UROLOGICAL SUPPLIES) ×2 IMPLANT
BASIN GRAD PLASTIC 32OZ STRL (MISCELLANEOUS) ×2 IMPLANT
BLADE SURG SZ10 CARB STEEL (BLADE) IMPLANT
BLADE SURG SZ11 CARB STEEL (BLADE) ×2 IMPLANT
CATH FOLEY SIL 2WAY 14FR5CC (CATHETERS) ×2 IMPLANT
CHLORAPREP W/TINT 26 (MISCELLANEOUS) ×2 IMPLANT
COVER LIGHT HANDLE STERIS (MISCELLANEOUS) ×1 IMPLANT
COVER MAYO STAND REUSABLE (DRAPES) ×2 IMPLANT
COVER WAND RF STERILE (DRAPES) ×2 IMPLANT
DERMABOND ADVANCED (GAUZE/BANDAGES/DRESSINGS) ×1
DERMABOND ADVANCED .7 DNX12 (GAUZE/BANDAGES/DRESSINGS) ×1 IMPLANT
DRAPE ARM DVNC X/XI (DISPOSABLE) ×3 IMPLANT
DRAPE COLUMN DVNC XI (DISPOSABLE) ×1 IMPLANT
DRAPE DA VINCI XI ARM (DISPOSABLE) ×3
DRAPE DA VINCI XI COLUMN (DISPOSABLE) ×1
DRAPE ROBOT W/ LEGGING 30X125 (DRAPES) ×2 IMPLANT
DRSG TEGADERM 2-3/8X2-3/4 SM (GAUZE/BANDAGES/DRESSINGS) ×6 IMPLANT
ELECT REM PT RETURN 9FT ADLT (ELECTROSURGICAL) ×2
ELECTRODE REM PT RTRN 9FT ADLT (ELECTROSURGICAL) ×1 IMPLANT
GAUZE 4X4 16PLY ~~LOC~~+RFID DBL (SPONGE) ×2 IMPLANT
GLOVE SURG SYN 6.5 ES PF (GLOVE) ×6 IMPLANT
GLOVE SURG SYN 6.5 PF PI (GLOVE) ×3 IMPLANT
GLOVE SURG UNDER POLY LF SZ6.5 (GLOVE) ×6 IMPLANT
GOWN STRL REUS W/ TWL LRG LVL3 (GOWN DISPOSABLE) ×3 IMPLANT
GOWN STRL REUS W/TWL LRG LVL3 (GOWN DISPOSABLE) ×3
GRASPER SUT TROCAR 14GX15 (MISCELLANEOUS) ×2 IMPLANT
HEMOSTAT ARISTA ABSORB 3G PWDR (HEMOSTASIS) ×1 IMPLANT
IRRIGATOR SUCT 8 DISP DVNC XI (IRRIGATION / IRRIGATOR) IMPLANT
IRRIGATOR SUCTION 8MM XI DISP (IRRIGATION / IRRIGATOR) ×1
KIT PINK PAD W/HEAD ARE REST (MISCELLANEOUS) ×2
KIT PINK PAD W/HEAD ARM REST (MISCELLANEOUS) ×1 IMPLANT
MANIFOLD NEPTUNE II (INSTRUMENTS) ×2 IMPLANT
MANIPULATOR URINE KRONNER 5 (MISCELLANEOUS) IMPLANT
MANIPULATORY URINE KRONNER 6003 ×1 IMPLANT
NEEDLE HYPO 22GX1.5 SAFETY (NEEDLE) ×2 IMPLANT
OBTURATOR OPTICAL STANDARD 8MM (TROCAR) ×1
OBTURATOR OPTICAL STND 8 DVNC (TROCAR) ×1
OBTURATOR OPTICALSTD 8 DVNC (TROCAR) ×1 IMPLANT
PACK GYN LAPAROSCOPIC (MISCELLANEOUS) ×2 IMPLANT
PAD ARMBOARD 7.5X6 YLW CONV (MISCELLANEOUS) ×2 IMPLANT
PAD OB MATERNITY 4.3X12.25 (PERSONAL CARE ITEMS) ×2 IMPLANT
PAD PREP 24X41 OB/GYN DISP (PERSONAL CARE ITEMS) ×2 IMPLANT
SCRUB CHG 4% DYNA-HEX 4OZ (MISCELLANEOUS) ×1 IMPLANT
SEAL CANN UNIV 5-8 DVNC XI (MISCELLANEOUS) ×3 IMPLANT
SEAL XI 5MM-8MM UNIVERSAL (MISCELLANEOUS) ×3
SEALER VESSEL DA VINCI XI (MISCELLANEOUS) ×1
SEALER VESSEL EXT DVNC XI (MISCELLANEOUS) ×1 IMPLANT
SET TUBE SMOKE EVAC HIGH FLOW (TUBING) ×2 IMPLANT
SPONGE GAUZE 2X2 8PLY STRL LF (GAUZE/BANDAGES/DRESSINGS) ×6 IMPLANT
SURGILUBE 2OZ TUBE FLIPTOP (MISCELLANEOUS) ×2 IMPLANT
SUT MNCRL 4-0 (SUTURE) ×1
SUT MNCRL 4-0 27XMFL (SUTURE) ×1
SUT VIC AB 2-0 CT1 (SUTURE) ×1 IMPLANT
SUTURE MNCRL 4-0 27XMF (SUTURE) ×1 IMPLANT
SYR 10ML LL (SYRINGE) ×2 IMPLANT
TAPE TRANSPORE STRL 2 31045 (GAUZE/BANDAGES/DRESSINGS) ×2 IMPLANT

## 2021-08-11 NOTE — Anesthesia Preprocedure Evaluation (Signed)
Anesthesia Evaluation  ?Patient identified by MRN, date of birth, ID band ?Patient awake ? ? ? ?Reviewed: ?Allergy & Precautions, NPO status , Patient's Chart, lab work & pertinent test results ? ?History of Anesthesia Complications ?Negative for: history of anesthetic complications ? ?Airway ?Mallampati: III ? ?TM Distance: <3 FB ?Neck ROM: full ? ? ? Dental ? ?(+) Chipped ?  ?Pulmonary ?neg shortness of breath, sleep apnea , former smoker,  ?  ?Pulmonary exam normal ? ? ? ? ? ? ? Cardiovascular ?Exercise Tolerance: Good ?(-) angina(-) Past MI and (-) DOE negative cardio ROS ?Normal cardiovascular exam ? ? ?  ?Neuro/Psych ?PSYCHIATRIC DISORDERS negative neurological ROS ?   ? GI/Hepatic ?Neg liver ROS, PUD, GERD  Controlled,  ?Endo/Other  ?diabetes, Gestational ? Renal/GU ?  ? ?  ?Musculoskeletal ? ? Abdominal ?  ?Peds ? Hematology ?negative hematology ROS ?(+)   ?Anesthesia Other Findings ?Past Medical History: ?No date: Abnormal Pap smear of vagina ?No date: BV (bacterial vaginosis) ?No date: Cervical dysplasia ?No date: Chlamydia ?No date: Depression ?No date: Dysmenorrhea ?No date: Gestational diabetes ?No date: Hives ?No date: Menorrhagia ?No date: Ovarian cyst ?No date: Rectal ulcer ?    Comment:  Negative for Crohn's ?No date: Vitamin D deficiency ? ?Past Surgical History: ?No date: CRYOTHERAPY ?No date: FINGER SURGERY ?No date: PILONIDAL CYST EXCISION ?No date: WISDOM TOOTH EXTRACTION ? ?BMI   ? Body Mass Index: 34.20 kg/m?  ?  ? ? Reproductive/Obstetrics ?negative OB ROS ? ?  ? ? ? ? ? ? ? ? ? ? ? ? ? ?  ?  ? ? ? ? ? ? ? ? ?Anesthesia Physical ?Anesthesia Plan ? ?ASA: 3 ? ?Anesthesia Plan: General ETT  ? ?Post-op Pain Management:   ? ?Induction: Intravenous ? ?PONV Risk Score and Plan: Ondansetron, Dexamethasone, Midazolam and Treatment may vary due to age or medical condition ? ?Airway Management Planned: Oral ETT ? ?Additional Equipment:  ? ?Intra-op Plan:   ? ?Post-operative Plan: Extubation in OR ? ?Informed Consent: I have reviewed the patients History and Physical, chart, labs and discussed the procedure including the risks, benefits and alternatives for the proposed anesthesia with the patient or authorized representative who has indicated his/her understanding and acceptance.  ? ? ? ?Dental Advisory Given ? ?Plan Discussed with: Anesthesiologist, CRNA and Surgeon ? ?Anesthesia Plan Comments: (Patient consented for risks of anesthesia including but not limited to:  ?- adverse reactions to medications ?- damage to eyes, teeth, lips or other oral mucosa ?- nerve damage due to positioning  ?- sore throat or hoarseness ?- Damage to heart, brain, nerves, lungs, other parts of body or loss of life ? ?Patient voiced understanding.)  ? ? ? ? ? ? ?Anesthesia Quick Evaluation ? ?

## 2021-08-11 NOTE — Op Note (Signed)
Operative Note  ? ?PRE-OP DIAGNOSIS: Desires Sterilization   ?  ?POST-OP DIAGNOSIS: Desires Sterilization   ? ?SURGEON: Adrian Prows MD ? ?ASSISTANT:  None ? ?ANESTHESIA: General ? ?PROCEDURE: Procedure(s):Robotic assisted laparoscopic bilateral salpingectomy   ? ?ESTIMATED BLOOD LOSS: 5 cc ? ?DRAINS: Foley ? ?SPECIMENS: Bilateral fallopian tubes   ? ?COMPLICATIONS: None ? ?DISPOSITION: PACU ? ?CONDITION: Stable ? ?INDICATIONS: Desires sterilization ? ?FINDINGS: Exam under anesthesia revealed an 12 week uterus. There was without adnexal masses or nodularity. The parametria was smooth. The cervix was negative for gross lesions. Intraoperative findings included: The uterus was grossly normal. The adnexa was normal bilaterally. The upper abdomen was normal including omentum, bowel, liver, stomach, and diaphragmatic surfaces. There was no evidence of grossly enlarged pelvic or right para-aortic lymph nodes.  ? ?PROCEDURE IN DETAIL: After informed consent was obtained, the patient was taken to the operating room where anesthesia was obtained without difficulty. The patient was positioned in the dorsal lithotomy position in [place] and her arms were carefully tucked at her sides and the usual precautions were taken.  She was prepped and draped in normal sterile fashion.  Time-out was performed. A foley catheter was placed. A speculum was placed in the vagina and the cervical os was dilated. The uterus sounded to 12 cm.  A standard zumi uterine manipulator was then placed in the uterus without incident.   ? ?Laparoscopic entry was obtained via an umbilical incision and direct entry. The 8 mm robotic optiview port was placed, abdomen insuffulated, and pelvis visualized with noted findings above.  The patient was placed in Trendelenburg and the bowel was displaced up into the upper abdomen.  The 2 additional robotic port were placed in a horizontal line across the upper abdomen. Robotic docking was performed.    ? ?Perforation of the posterior uterus with the zumi manipulator was noted. The manipulator was deflated and removed. At the robotic console, The right and left fallopian tubes were identified. The vessel sealer was used to seal and excise the right and then the left fallopian tube. The fallopian tubes were passed through the robotic port.  Excellent hemostasis was noted.  The area of uterine perforation had a small amount of bleeding. Arista was applied and hemostasis was achieved. Photos were taken. ?   ?The instruments were then all removed from the abdomen. The robot was undocked. The air was expelled from the abdomen. The trocars were removed. The skin was closed with 4-0 monocryl with a subcuticular stitch and Indermil glue.  The patient tolerated the procedure well.  Sponge, lap and needle counts were correct x2.  The patient was taken to recovery room in excellent condition. ? ?The foley was removed from the bladder.  ?Adrian Prows MD, FACOG ?[facility] ?08/11/2021 ?9:14 AM ? ?

## 2021-08-11 NOTE — Transfer of Care (Signed)
Immediate Anesthesia Transfer of Care Note ? ?Patient: Deborah Mccarthy ? ?Procedure(s) Performed: XI ROBOTIC ASSISTED SALPINGECTOMY (Bilateral) ? ?Patient Location: PACU ? ?Anesthesia Type:General ? ?Level of Consciousness: drowsy ? ?Airway & Oxygen Therapy: Patient Spontanous Breathing and Patient connected to face mask oxygen ? ?Post-op Assessment: Report given to RN and Post -op Vital signs reviewed and stable ? ?Post vital signs: Reviewed and stable ? ?Last Vitals:  ?Vitals Value Taken Time  ?BP 135/90 08/11/21 0901  ?Temp    ?Pulse 78 08/11/21 0903  ?Resp 19 08/11/21 0903  ?SpO2 100 % 08/11/21 0903  ?Vitals shown include unvalidated device data. ? ?Last Pain:  ?Vitals:  ? 08/11/21 0631  ?TempSrc: Temporal  ?PainSc: 0-No pain  ?   ? ?  ? ?Complications: No notable events documented. ?

## 2021-08-11 NOTE — Anesthesia Procedure Notes (Signed)
Procedure Name: Intubation ?Date/Time: 08/11/2021 7:33 AM ?Performed by: Daryel Gerald, RN ?Pre-anesthesia Checklist: Patient identified, Emergency Drugs available, Suction available and Patient being monitored ?Patient Re-evaluated:Patient Re-evaluated prior to induction ?Oxygen Delivery Method: Circle system utilized ?Preoxygenation: Pre-oxygenation with 100% oxygen ?Induction Type: IV induction ?Ventilation: Mask ventilation without difficulty ?Laryngoscope Size: Mac and 3 ?Grade View: Grade I ?Tube type: Oral ?Tube size: 7.0 mm ?Number of attempts: 1 ?Airway Equipment and Method: Stylet and Oral airway ?Placement Confirmation: ETT inserted through vocal cords under direct vision, positive ETCO2 and breath sounds checked- equal and bilateral ?Secured at: 21 cm ?Tube secured with: Tape ?Dental Injury: Teeth and Oropharynx as per pre-operative assessment  ? ? ? ? ?

## 2021-08-11 NOTE — Interval H&P Note (Signed)
History and Physical Interval Note: ? ?08/11/2021 ?7:28 AM ? ?Deborah Mccarthy  has presented today for surgery, with the diagnosis of desires sterilization.  The various methods of treatment have been discussed with the patient and family. After consideration of risks, benefits and other options for treatment, the patient has consented to  Procedure(s): ?XI ROBOTIC ASSISTED SALPINGECTOMY (Bilateral) as a surgical intervention.  The patient's history has been reviewed, patient examined, no change in status, stable for surgery.  I have reviewed the patient's chart and labs.  Questions were answered to the patient's satisfaction.   ? ? ?[place] ? ? ?

## 2021-08-11 NOTE — Anesthesia Postprocedure Evaluation (Signed)
Anesthesia Post Note ? ?Patient: Deletha Jaffee ? ?Procedure(s) Performed: XI ROBOTIC ASSISTED SALPINGECTOMY (Bilateral) ? ?Patient location during evaluation: PACU ?Anesthesia Type: General ?Level of consciousness: awake and alert ?Pain management: pain level controlled ?Vital Signs Assessment: post-procedure vital signs reviewed and stable ?Respiratory status: spontaneous breathing, nonlabored ventilation, respiratory function stable and patient connected to nasal cannula oxygen ?Cardiovascular status: blood pressure returned to baseline and stable ?Postop Assessment: no apparent nausea or vomiting ?Anesthetic complications: no ? ? ?No notable events documented. ? ? ?Last Vitals:  ?Vitals:  ? 08/11/21 1000 08/11/21 1021  ?BP: (!) 128/92 108/67  ?Pulse: 63 62  ?Resp: 14 16  ?Temp:  (!) 36.2 ?C  ?SpO2: 99% 98%  ?  ?Last Pain:  ?Vitals:  ? 08/11/21 1021  ?TempSrc: Temporal  ?PainSc: 4   ? ? ?  ?  ?  ?  ?  ?  ? ?Precious Haws Shalissa Easterwood ? ? ? ? ?

## 2021-08-11 NOTE — Discharge Instructions (Addendum)
Postoperative Instructions ? ?Take Motrin 600 mg and Tylenol 1000 mg every 6 hours for pain control.   I recommend taking this medication consistently for at least 1 week after your surgery. ? ?Take Roxicodone 5 mg as needed every 6 hours for severe pain.  Some people will take this before bed to make it easier to sleep.  Do not take this pain medicine longer than 7 days after your surgery. ? ?Please take small walks around your living space every 2-3 hours after your surgery.  You can start this the day of your surgery or the day after your surgery.  Continue this for the first 7 days after surgery. After the first 7 days you can slowly increase you activity. ? ?Do not lift heavy objects for 2 weeks after the surgery.  Nothing in the vagina for 2 weeks as you heal.  ? ?Try not to strain with bowel movements.  It can be a good idea to take an over-the-counter stool softener after your surgery.  You can take Colace 100 mg twice a day.  Try to drink 60 to 90 ounces of water a day to keep her stools soft. ? ?After the surgery your diet should consist of foods that are easy on your stomach.  Soups, sandwiches, crackers, rice, applesauce, popsicles, mashed potatoes, cooked vegetables or canned fruits are best.  After you have a normal bowel movement you can return to your regular diet. ? ?You should have a bowel movement within 3 days of the surgery.  If you not able to have a bowel movement please contact her office so we can review over-the-counter options to help have a bowel movement.  Walking regularly will help with having a bowel movement. ? ?Please let us know if you have any heavy bleeding from the vagina.  Small amounts of bleeding are typical after hysterectomy but you should not have bleeding that is heavy enough to saturate a menstrual pad. ? ?If you have a bandage covering your incisions he could remove it the day after your surgery.  Your incisions are closed with dissolvable suture and a topical glue.   The topical glue you can peel off gently in the shower 2 weeks after your surgery.  You should shower every day after the surgery.  You can let soapy water gently run over the incisions to keep them clean.  Please contact us if your incisions appear infected or if you have any drainage of fluid from the incisions.  I would like to know about these symptoms same day since wound infections can get bad fast. ?                                                                               AMBULATORY SURGERY  ?  DISCHARGE INSTRUCTIONS ? ? ?The drugs that you were given will stay in your system until tomorrow so for the next 24 hours you should not: ? ?Drive an automobile ?Make any legal decisions ?Drink any alcoholic beverage ? ? ?You may resume regular meals tomorrow.  Today it is better to start with liquids and gradually work up to solid foods. ? ?You may eat anything you prefer, but it is better to start with liquids, then soup and crackers, and gradually work up to solid foods. ? ? ?Please notify your doctor immediately if you have any unusual bleeding, trouble breathing, redness and pain at the surgery site, drainage, fever, or pain not relieved by medication. ? ? ? ?Additional Instructions: ? ? ? ? ? ? ? ?Please contact your physician with any problems or Same Day Surgery at [phone], Monday through Friday 6 am to 4 pm, or [place] at [facility] number at [phone].  ? ? ? ? ? ?

## 2021-08-13 LAB — SURGICAL PATHOLOGY

## 2021-08-17 ENCOUNTER — Other Ambulatory Visit: Payer: Self-pay

## 2021-08-17 ENCOUNTER — Ambulatory Visit (INDEPENDENT_AMBULATORY_CARE_PROVIDER_SITE_OTHER): Payer: Managed Care, Other (non HMO) | Admitting: Obstetrics and Gynecology

## 2021-08-17 ENCOUNTER — Encounter: Payer: Self-pay | Admitting: Obstetrics and Gynecology

## 2021-08-17 VITALS — BP 100/70 | Ht 61.0 in | Wt 184.0 lb

## 2021-08-17 DIAGNOSIS — Z4889 Encounter for other specified surgical aftercare: Secondary | ICD-10-CM

## 2021-08-17 NOTE — Progress Notes (Signed)
?  Postoperative Follow-up ?Patient presents post op from  [facility] Robot-assisted Laparoscopic salpingectomy   for  sterilization , 1 week ago. ? ?Subjective: ?She reports that since the surgery she has been feeling well. ?Eating a regular diet without difficulty.  ?Pain is controlled without any medications.    ?Activity: normal activities of daily living.  ? ?Objective: ?BP 100/70   Ht '5\' 1"'$  (1.549 m)   Wt 184 lb (83.5 kg)   LMP 08/09/2021 Comment: Urine Pregnancy 08/11/2021 negative reviewed by Marya Amsler RN  BMI 34.77 kg/m?  ?Physical Exam ?Constitutional:   ?   Appearance: Normal appearance. She is well-developed.  ?HENT:  ?   Head: Normocephalic and atraumatic.  ?Eyes:  ?   Extraocular Movements: Extraocular movements intact.  ?   Pupils: Pupils are equal, round, and reactive to light.  ?Neck:  ?   Thyroid: No thyromegaly.  ?Cardiovascular:  ?   Rate and Rhythm: Normal rate and regular rhythm.  ?   Heart sounds: Normal heart sounds.  ?Pulmonary:  ?   Effort: Pulmonary effort is normal.  ?   Breath sounds: Normal breath sounds.  ?Abdominal:  ?   General: Bowel sounds are normal. There is no distension.  ?   Palpations: Abdomen is soft. There is no mass.  ?   Comments: Incisions are clean, dry, and intact  ?Musculoskeletal:  ?   Cervical back: Neck supple.  ?Neurological:  ?   Mental Status: She is alert and oriented to person, place, and time.  ?Skin: ?   General: Skin is warm and dry.  ?Psychiatric:     ?   Behavior: Behavior normal.     ?   Thought Content: Thought content normal.     ?   Judgment: Judgment normal.  ?Vitals reviewed.  ? ? ?Assessment: ?s/p :  Xi Robot-assisted Laparoscopic salpingectomy  stable ? ?Plan: ? ?Patient has done well after surgery with no apparent complications.  ?  ?I have discussed the post-operative course to date, and the expected progress moving forward.  The patient understands what complications to be concerned about.  I will see the patient in routine follow up, or sooner if  needed.   ? ?Activity plan: No heavy lifting for 4 weeks post op.  ? ?Adrian Prows MD, ?[facility] ?08/17/2021 ?10:06 AM ? ? ? ?

## 2021-08-18 ENCOUNTER — Encounter: Payer: Self-pay | Admitting: Obstetrics and Gynecology

## 2021-08-21 ENCOUNTER — Other Ambulatory Visit: Payer: Self-pay | Admitting: Obstetrics and Gynecology

## 2021-08-21 DIAGNOSIS — N8110 Cystocele, unspecified: Secondary | ICD-10-CM

## 2021-08-21 NOTE — Progress Notes (Signed)
Referral to urogynecology made ?

## 2021-08-24 ENCOUNTER — Telehealth: Payer: Self-pay

## 2021-08-24 NOTE — Telephone Encounter (Signed)
Pt calling; is pp and had sterilization surgery c CRS; last few day has started having weird sxs like a thick, brown jellylike d/c and last night started getting low pelvic and low back pain; these sxs do not feel normal for her.  Is this normal or does she need to be seen?  [phone] ?

## 2021-08-24 NOTE — Telephone Encounter (Signed)
I can see her- she can be worked into my schedule for Thursday

## 2021-08-25 NOTE — Telephone Encounter (Signed)
I contacted patient via phone. Left message for patient to call back to confirm scheduled work in with [place]

## 2021-08-26 NOTE — Telephone Encounter (Signed)
Contacted patient via phone. Patient is aware of scheduled appointment

## 2021-08-27 ENCOUNTER — Ambulatory Visit (INDEPENDENT_AMBULATORY_CARE_PROVIDER_SITE_OTHER): Payer: Managed Care, Other (non HMO) | Admitting: Obstetrics and Gynecology

## 2021-08-27 ENCOUNTER — Encounter: Payer: Self-pay | Admitting: Obstetrics and Gynecology

## 2021-08-27 VITALS — BP 98/60 | Ht 61.0 in | Wt 176.0 lb

## 2021-08-27 DIAGNOSIS — Z4889 Encounter for other specified surgical aftercare: Secondary | ICD-10-CM

## 2021-08-27 DIAGNOSIS — R350 Frequency of micturition: Secondary | ICD-10-CM

## 2021-08-27 LAB — POCT URINALYSIS DIPSTICK
Bilirubin, UA: NEGATIVE
Glucose, UA: NEGATIVE
Ketones, UA: NEGATIVE
Leukocytes, UA: NEGATIVE
Nitrite, UA: NEGATIVE
Protein, UA: NEGATIVE
Urobilinogen, UA: 0.2 E.U./dL
pH, UA: 7.5 (ref 5.0–8.0)

## 2021-08-27 MED ORDER — AMOXICILLIN-POT CLAVULANATE 875-125 MG PO TABS
1.0000 | ORAL_TABLET | Freq: Two times a day (BID) | ORAL | 0 refills | Status: AC
Start: 2021-08-27 — End: 2021-09-03

## 2021-08-27 NOTE — Progress Notes (Signed)
? ?Patient ID: Deborah Mccarthy, female   DOB: [birth year], 41 y.o.   MRN: [id number] ? ?Reason for Consult: Urinary Tract Infection (Urinary frequency, no burning. Lower back pain and pelvic pain) ?  ?Referred by Pleas Koch, NP ? ?Subjective:  ?   ?HPI: ? ?Deborah Mccarthy is a 41 y.o. female she is here following up for heavy menstrual bleeding.  She recently had a salpingectomy.  During her salpingectomy her uterus was perforated with the uterine manipulator.  She reports that she has not had any fevers since the surgery.  However she started having heavy menstrual bleeding with mucus-like discharge on Sunday and more severe crampy pain.  She also has some concerns regarding urinary frequency. ? ?Gynecological History ? ?Patient's last menstrual period was 08/20/2021 (exact date). ? ?Past Medical History:  ?Diagnosis Date  ? Abnormal Pap smear of vagina   ? BV (bacterial vaginosis)   ? Cervical dysplasia   ? Chlamydia   ? Depression   ? Dysmenorrhea   ? Gestational diabetes   ? Hives   ? Menorrhagia   ? Ovarian cyst   ? Rectal ulcer   ? Negative for Crohn's  ? Vitamin D deficiency   ? ?Family History  ?Problem Relation Age of Onset  ? Graves' disease Mother   ? Coronary artery disease Father   ? Diabetes Father   ? Breast cancer Maternal Grandmother 78  ? Coronary artery disease Maternal Grandmother   ? Colon cancer Maternal Grandfather   ? Colon cancer Other   ? ?Past Surgical History:  ?Procedure Laterality Date  ? CRYOTHERAPY    ? FINGER SURGERY    ? PILONIDAL CYST EXCISION    ? WISDOM TOOTH EXTRACTION    ? XI ROBOTIC ASSISTED SALPINGECTOMY Bilateral 08/11/2021  ? Procedure: XI ROBOTIC ASSISTED SALPINGECTOMY;  Surgeon: Homero Fellers, MD;  Location: [facility];  Service: Gynecology;  Laterality: Bilateral;  ? ? ?Short Social History:  ?Social History  ? ?Tobacco Use  ? Smoking status: Former  ?  Packs/day: 1.00  ?  Years: 15.00  ?  Pack years: 15.00  ?  Types: Cigarettes  ?  Quit date: 10/22/2020  ?   Years since quitting: 0.8  ? Smokeless tobacco: Never  ?Substance Use Topics  ? Alcohol use: No  ?  Alcohol/week: 0.0 standard drinks  ? ? ?No Known Allergies ? ?Current Outpatient Medications  ?Medication Sig Dispense Refill  ? amoxicillin-clavulanate (AUGMENTIN) 875-125 MG tablet Take 1 tablet by mouth 2 (two) times daily for 7 days. 14 tablet 0  ? Prenatal Vit-Fe Fumarate-FA (MULTIVITAMIN-PRENATAL) 27-0.8 MG TABS tablet Take 1 tablet by mouth daily at 12 noon. (Patient not taking: Reported on 08/27/2021)    ? ?No current facility-administered medications for this visit.  ? ? ?Review of Systems  ?Constitutional: Negative for chills, fatigue, fever and unexpected weight change.  ?HENT: Negative for trouble swallowing.  ?Eyes: Negative for loss of vision.  ?Respiratory: Negative for cough, shortness of breath and wheezing.  ?Cardiovascular: Negative for chest pain, leg swelling, palpitations and syncope.  ?GI: Negative for abdominal pain, blood in stool, diarrhea, nausea and vomiting.  ?GU: Negative for difficulty urinating, dysuria, frequency and hematuria.  ?Musculoskeletal: Negative for back pain, leg pain and joint pain.  ?Skin: Negative for rash.  ?Neurological: Negative for dizziness, headaches, light-headedness, numbness and seizures.  ?Psychiatric: Negative for behavioral problem, confusion, depressed mood and sleep disturbance.   ? ?   ?Objective:  ?Objective  ? ?Vitals:  ?  08/27/21 1039  ?BP: 98/60  ?Weight: 176 lb (79.8 kg)  ?Height: '5\' 1"'$  (1.549 m)  ? ?Body mass index is 33.25 kg/m?. ? ?Physical Exam ?Vitals and nursing note reviewed. Exam conducted with a chaperone present.  ?Constitutional:   ?   Appearance: Normal appearance. She is well-developed.  ?HENT:  ?   Head: Normocephalic and atraumatic.  ?Eyes:  ?   Extraocular Movements: Extraocular movements intact.  ?   Pupils: Pupils are equal, round, and reactive to light.  ?Cardiovascular:  ?   Rate and Rhythm: Normal rate and regular rhythm.   ?Pulmonary:  ?   Effort: Pulmonary effort is normal. No respiratory distress.  ?   Breath sounds: Normal breath sounds.  ?Abdominal:  ?   General: Abdomen is flat.  ?   Palpations: Abdomen is soft.  ?Genitourinary: ?   Comments: External: Normal appearing vulva. No lesions noted.  ?Speculum examination: Normal appearing cervix. Small dark red blood in the vaginal vault. No discharge.  Bimanual examination: Uterus midline, +tender, normal in size, shape and contour.  + CMT. No adnexal masses. No adnexal tenderness. Pelvis not fixed. ? ?Breast exam: exam not performed ?Musculoskeletal:     ?   General: No signs of injury.  ?Skin: ?   General: Skin is warm and dry.  ?Neurological:  ?   Mental Status: She is alert and oriented to person, place, and time.  ?Psychiatric:     ?   Behavior: Behavior normal.     ?   Thought Content: Thought content normal.     ?   Judgment: Judgment normal.  ? ? ?Assessment/Plan:  ?  ? ?41 year old status post bilateral salpingectomy for sterilization complicated by small uterine perforation. ?Patient appears to be having a normal menstrual menstrual cycle. ?She does exhibit some cervical motion tenderness and for this reason I will start her on 7 days of antibiotics. ?UA not suggestive of urinary infection but will send urine culture. ?Follow-up if worsening of symptoms or fevers. ? ?More than 10 minutes were spent face to face with the patient in the room, reviewing the medical record, labs and images, and coordinating care for the patient. The plan of management was discussed in detail and counseling was provided.  ? ?  ?Adrian Prows MD ?[facility], [place] ?08/27/2021 ?11:01 AM ? ? ?

## 2021-08-30 LAB — URINE CULTURE

## 2021-09-14 ENCOUNTER — Other Ambulatory Visit (HOSPITAL_COMMUNITY)
Admission: RE | Admit: 2021-09-14 | Discharge: 2021-09-14 | Disposition: A | Discharge status: Home / Self Care | Payer: Managed Care, Other (non HMO) | Source: Ambulatory Visit | Attending: Obstetrics and Gynecology | Admitting: Obstetrics and Gynecology

## 2021-09-14 ENCOUNTER — Encounter: Payer: Self-pay | Admitting: Obstetrics and Gynecology

## 2021-09-14 ENCOUNTER — Ambulatory Visit (INDEPENDENT_AMBULATORY_CARE_PROVIDER_SITE_OTHER): Payer: Managed Care, Other (non HMO) | Admitting: Obstetrics and Gynecology

## 2021-09-14 VITALS — BP 120/66 | Ht 61.0 in | Wt 179.0 lb

## 2021-09-14 DIAGNOSIS — Z01419 Encounter for gynecological examination (general) (routine) without abnormal findings: Secondary | ICD-10-CM | POA: Diagnosis not present

## 2021-09-14 DIAGNOSIS — Z124 Encounter for screening for malignant neoplasm of cervix: Secondary | ICD-10-CM | POA: Insufficient documentation

## 2021-09-14 DIAGNOSIS — R87610 Atypical squamous cells of undetermined significance on cytologic smear of cervix (ASC-US): Secondary | ICD-10-CM | POA: Insufficient documentation

## 2021-09-14 NOTE — Progress Notes (Signed)
Subjective:  ?  ? Deborah Mccarthy is a 41 y.o. female P3  who is here for a comprehensive physical exam. The patient reports no problems. Patient reports a monthly period lasting 7 days. She is sexually active using BTL for contraception. She denies pelvic pain or abnormal discharge. Patient denies urinary incontinence and reports regular bowel movements. Patient is without any complaints. Patient desires vaginal swab for STI screening ? ?Past Medical History:  ?Diagnosis Date  ? Abnormal Pap smear of vagina   ? BV (bacterial vaginosis)   ? Cervical dysplasia   ? Chlamydia   ? Depression   ? Dysmenorrhea   ? Gestational diabetes   ? Hives   ? Menorrhagia   ? Ovarian cyst   ? Rectal ulcer   ? Negative for Crohn's  ? Vitamin D deficiency   ? ?Past Surgical History:  ?Procedure Laterality Date  ? CRYOTHERAPY    ? FINGER SURGERY    ? PILONIDAL CYST EXCISION    ? WISDOM TOOTH EXTRACTION    ? XI ROBOTIC ASSISTED SALPINGECTOMY Bilateral 08/11/2021  ? Procedure: XI ROBOTIC ASSISTED SALPINGECTOMY;  Surgeon: Homero Fellers, MD;  Location: [facility];  Service: Gynecology;  Laterality: Bilateral;  ? ?Family History  ?Problem Relation Age of Onset  ? Graves' disease Mother   ? Coronary artery disease Father   ? Diabetes Father   ? Breast cancer Maternal Grandmother 61  ? Coronary artery disease Maternal Grandmother   ? Colon cancer Maternal Grandfather   ? Colon cancer Other   ? ? ?Social History  ? ?Socioeconomic History  ? Marital status: Married  ?  Spouse name: Not on file  ? Number of children: 3  ? Years of education: Not on file  ? Highest education level: Not on file  ?Occupational History  ? Not on file  ?Tobacco Use  ? Smoking status: Former  ?  Packs/day: 1.00  ?  Years: 15.00  ?  Pack years: 15.00  ?  Types: Cigarettes  ?  Quit date: 10/22/2020  ?  Years since quitting: 0.8  ? Smokeless tobacco: Never  ?Vaping Use  ? Vaping Use: Never used  ?Substance and Sexual Activity  ? Alcohol use: No  ?  Alcohol/week: 0.0  standard drinks  ? Drug use: No  ? Sexual activity: Not Currently  ?  Birth control/protection: Surgical  ?Other Topics Concern  ? Not on file  ?Social History Narrative  ? Married.  ? 3 children.  ? Works as a Education officer, museum.  ? Enjoys reading, gardening, relaxing.   ? ?Social Determinants of Health  ? ?Financial Resource Strain: Not on file  ?Food Insecurity: Not on file  ?Transportation Needs: Not on file  ?Physical Activity: Not on file  ?Stress: Not on file  ?Social Connections: Not on file  ?Intimate Partner Violence: Not on file  ? ?Health Maintenance  ?Topic Date Due  ? URINE MICROALBUMIN  Never done  ? COVID-19 Vaccine (3 - Pfizer risk series) 05/23/2022 (Originally 09/07/2019)  ? INFLUENZA VACCINE  12/22/2021  ? PAP SMEAR-Modifier  02/18/2023  ? TETANUS/TDAP  06/20/2031  ? Hepatitis C Screening  Completed  ? HIV Screening  Completed  ? HPV VACCINES  Aged Out  ? ? ?  ? ?Review of Systems ?Pertinent items noted in HPI and remainder of comprehensive ROS otherwise negative.  ? ?Objective:  ?Blood pressure 120/66, height '5\' 1"'$  (1.549 m), weight 179 lb (81.2 kg), last menstrual period 08/20/2021, not currently breastfeeding. ? ?  GENERAL: Well-developed, well-nourished female in no acute distress.  ?HEENT: Normocephalic, atraumatic. Sclerae anicteric.  ?NECK: Supple. Normal thyroid.  ?LUNGS: Clear to auscultation bilaterally.  ?HEART: Regular rate and rhythm. ?BREASTS: Symmetric in size. No palpable masses or lymphadenopathy, skin changes, or nipple drainage. ?ABDOMEN: Soft, nontender, nondistended. No organomegaly. ?PELVIC: Normal external female genitalia. Vagina is pink and rugated.  Normal discharge. Normal appearing cervix. Uterus is normal in size. No adnexal mass or tenderness. Chaperone present during the pelvic exam ?EXTREMITIES: No cyanosis, clubbing, or edema, 2+ distal pulses. ?  ?  ?Assessment:  ? ? Healthy female exam.    ?  ?Plan:  ? ? Pap smear collected ?Screening mammogram ordered ?Vaginal swab  collected ?Patient will be contacted with abnormal results ?See After Visit Summary for Counseling Recommendations  ? ?

## 2021-09-16 LAB — CERVICOVAGINAL ANCILLARY ONLY
Chlamydia: NEGATIVE
Comment: NEGATIVE
Comment: NEGATIVE
Comment: NORMAL
Neisseria Gonorrhea: NEGATIVE
Trichomonas: NEGATIVE

## 2021-09-21 LAB — CYTOLOGY - PAP
Comment: NEGATIVE
Diagnosis: UNDETERMINED — AB
High risk HPV: NEGATIVE

## 2021-12-22 ENCOUNTER — Ambulatory Visit: Payer: Managed Care, Other (non HMO) | Admitting: Obstetrics and Gynecology

## 2021-12-22 ENCOUNTER — Encounter: Payer: Self-pay | Admitting: Obstetrics and Gynecology

## 2021-12-22 VITALS — BP 134/64 | HR 90 | Ht 59.5 in | Wt 189.0 lb

## 2021-12-22 DIAGNOSIS — N8110 Cystocele, unspecified: Secondary | ICD-10-CM

## 2021-12-22 DIAGNOSIS — R350 Frequency of micturition: Secondary | ICD-10-CM | POA: Diagnosis not present

## 2021-12-22 DIAGNOSIS — N812 Incomplete uterovaginal prolapse: Secondary | ICD-10-CM | POA: Diagnosis not present

## 2021-12-22 DIAGNOSIS — N816 Rectocele: Secondary | ICD-10-CM

## 2021-12-22 LAB — POCT URINALYSIS DIPSTICK
Bilirubin, UA: NEGATIVE
Blood, UA: NEGATIVE
Glucose, UA: NEGATIVE
Ketones, UA: NEGATIVE
Leukocytes, UA: NEGATIVE
Nitrite, UA: NEGATIVE
Protein, UA: NEGATIVE
Spec Grav, UA: 1.02 (ref 1.010–1.025)
Urobilinogen, UA: 1 E.U./dL
pH, UA: 7.5 (ref 5.0–8.0)

## 2021-12-22 NOTE — Patient Instructions (Signed)
Constipation: Our goal is to achieve formed bowel movements daily or every-other-day.  You may need to try different combinations of the following options to find what works best for you - everybody's body works differently so feel free to adjust the dosages as needed.  Some options to help maintain bowel health include:  Dietary changes (more leafy greens, vegetables and fruits; less processed foods) Fiber supplementation (Benefiber, FiberCon, Metamucil or Psyllium). Start slow and increase gradually to full dose. Over-the-counter agents such as: stool softeners (Docusate or Colace) and/or laxatives (Miralax, milk of magnesia)  "Power Pudding" is a natural mixture that may help your constipation.  To make blend 1 cup applesauce, 1 cup wheat bran, and 3/4 cup prune juice, refrigerate and then take 1 tablespoon daily with a large glass of water as needed.   You have a stage 2 (out of 4) prolapse.  We discussed the fact that it is not life threatening but there are several treatment options. For treatment of pelvic organ prolapse, we discussed options for management including expectant management, conservative management, and surgical management, such as Kegels, a pessary, pelvic floor physical therapy, and specific surgical procedures.

## 2021-12-22 NOTE — Progress Notes (Unsigned)
Deborah Mccarthy  Referring Provider: Homero Fellers, * PCP: Pleas Koch, NP Date of Service: 12/22/2021  SUBJECTIVE Chief Complaint: New Patient (Initial Visit) Deborah Mccarthy is a 41 y.o. female here for a consult on rectocele.)  History of Present Illness: Deborah Mccarthy is a 41 y.o. {ED SANE ZYYQ:82500} female seen in [facility] at the request of Dr. Gilman Schmidt for evaluation of prolapse.    Review of records significant for: Delivered her last child 07/06/21  Urinary Symptoms: Does not leak urine.   Day time voids 5-10.  Nocturia: 0-1 times per night to void. Voiding dysfunction: she empties her bladder well.  does not use a catheter to empty bladder.  When urinating, she feels she has no difficulties Drinks: *** per day  UTIs: {NUMBERS 1-10:18281} UTI's in the last year.   {ACTIONS;DENIES/REPORTS:21021675::"Denies"} history of {urologic concerns:24757}  Pelvic Organ Prolapse Symptoms:                  She Admits to a feeling of a bulge the vaginal area since she delivered her last child in Feb. Feels the bulge usually when she has to have a bowel movement. Then after the bowel movement it resolves. Has improved somewhat since her delivery.  She Denies seeing a bulge.  This bulge is bothersome. Has done some pelvic floor exercises but has not done physical therapy  Bowel Symptom: Bowel movements: every day or every other day. Occasional constipation where she will go longer.  Stool consistency: soft  Straining: no.  Splinting: yes.  Incomplete evacuation: yes.  She Denies accidental bowel leakage / fecal incontinence Bowel regimen: none Last colonoscopy: Date ***, Results ***  Sexual Function Sexually active: {yes/no:19897}.  Sexual orientation: {Sexual Orientation:[phone]} Pain with sex: {pain with sex:24762}  Pelvic Pain {denies/ admits to:24761} pelvic pain Location: *** Pain occurs: *** Prior  pain treatment: *** Improved by: *** Worsened by: ***   Past Medical History:  Past Medical History:  Diagnosis Date   Abnormal Pap smear of vagina    BV (bacterial vaginosis)    Cervical dysplasia    Chlamydia    Depression    Dysmenorrhea    Gestational diabetes    Hives    Menorrhagia    Ovarian cyst    Rectal ulcer    Negative for Crohn's   Vitamin D deficiency      Past Surgical History:   Past Surgical History:  Procedure Laterality Date   CRYOTHERAPY     FINGER SURGERY     PILONIDAL CYST EXCISION     WISDOM TOOTH EXTRACTION     XI ROBOTIC ASSISTED SALPINGECTOMY Bilateral 08/11/2021   Procedure: XI ROBOTIC ASSISTED SALPINGECTOMY;  Surgeon: Homero Fellers, MD;  Location: [facility];  Service: Gynecology;  Laterality: Bilateral;     Past OB/GYN History: OB History  Gravida Para Term Preterm AB Living  '5 3 3   2 3  '$ SAB IAB Ectopic Multiple Live Births  1 1   0 3    # Outcome Date GA Lbr Len/2nd Weight Sex Delivery Anes PTL Lv  5 Term 07/06/21 55w2d00:58 / 03:16 6 lb 3.1 oz (2.81 kg) M Vag-Spont EPI  LIV  4 Term 03/13/14 422w0d7 lb 3.2 oz (3.266 kg)  Vag-Spont     3 Term 01/02/04   7 lb 8 oz (3.402 kg) F Vag-Spont     2 IAB  1 SAB            Not breastfeeding.  Patient's last menstrual period was 12/05/2021. Last several periods have been heavier.  Contraception: tubal ligation Last pap smear was ***.  Any history of abnormal pap smears: {yes/no:19897}.   Medications: She has a current medication list which includes the following prescription(s): multivitamin-prenatal.   Allergies: Patient has No Known Allergies.   Social History:  Social History   Tobacco Use   Smoking status: Former    Packs/day: 1.00    Years: 15.00    Total pack years: 15.00    Types: Cigarettes    Quit date: 10/22/2020    Years since quitting: 1.1   Smokeless tobacco: Never  Vaping Use   Vaping Use: Never used  Substance Use Topics   Alcohol use: No     Alcohol/week: 0.0 standard drinks of alcohol   Drug use: No    Relationship status: {relationship status:24764} She lives with ***.   She {ACTION; IS/IS QJJ:[id number]} employed ***. Regular exercise: {Yes/No:304960894} History of abuse: {Yes/No:304960894}  Family History:   Family History  Problem Relation Age of Onset   Graves' disease Mother    Coronary artery disease Father    Diabetes Father    Breast cancer Maternal Grandmother 50   Coronary artery disease Maternal Grandmother    Colon cancer Maternal Grandfather    Colon cancer Other      Review of Systems: ROS   OBJECTIVE Physical Exam: Vitals:   12/22/21 1526  BP: 134/64  Pulse: 90  Weight: 189 lb (85.7 kg)  Height: 4' 11.5" (1.511 m)    Physical Exam   GU / Detailed Urogynecologic Evaluation:  Pelvic Exam: Normal external female genitalia; Bartholin's and Skene's glands normal in appearance; urethral meatus normal in appearance, no urethral masses or discharge.   CST: {gen negative/positive:315881}  Reflexes: bulbocavernosis {DESC; PRESENT/NOT PRESENT:21021351}, anocutaneous {DESC; PRESENT/NOT PRESENT:21021351} ***bilaterally.  Speculum exam reveals normal vaginal mucosa {With/Without:20273} atrophy. Cervix {exam; gyn cervix:30847}. Uterus {exam; pelvic uterus:30849}. Adnexa {exam; adnexa:12223}.    s/p hysterectomy: Speculum exam reveals normal vaginal mucosa {With/Without:20273}  atrophy and normal vaginal cuff.  Adnexa {exam; adnexa:12223}.    With apex supported, anterior compartment defect was {reduced:24765}  Pelvic floor strength {Roman # I-V:19040}/V, puborectalis {Roman # I-V:19040}/V external anal sphincter {Roman # I-V:19040}/V  Pelvic floor musculature: Right levator {Tender/Non-tender:20250}, Right obturator {Tender/Non-tender:20250}, Left levator {Tender/Non-tender:20250}, Left obturator {Tender/Non-tender:20250}  POP-Q:   POP-Q  -1.5                                            Aa    -1.5                                           Ba  -6                                              C   5  Gh  4                                            Pb  10                                            tvl   0                                            Ap  0                                            Bp  -7.5                                              D     Rectal Exam:  Normal sphincter tone, {rectocele:24766} distal rectocele, enterocoele {DESC; PRESENT/NOT PRESENT:21021351}, no rectal masses, {sign of:24767} dyssynergia when asking the patient to bear down.  Post-Void Residual (PVR) by Bladder Scan: In order to evaluate bladder emptying, we discussed obtaining a postvoid residual and she agreed to this procedure.  Procedure: The ultrasound unit was placed on the patient's abdomen in the suprapubic region after the patient had voided. A PVR of 23 ml was obtained by bladder scan.  Laboratory Results: POC urine: negative  ***I visualized the urine specimen, noting the specimen to be {urine color:24768}  ASSESSMENT AND PLAN Ms. Tays is a 41 y.o. with:  1. Urinary frequency       Jaquita Folds, MD   Medical Decision Making:  - Reviewed/ ordered a clinical laboratory test - Reviewed/ ordered a radiologic study - Reviewed/ ordered medicine test - Decision to obtain old records - Discussion of management of or test interpretation with an external physician / other healthcare professional  - Assessment requiring independent historian - Review and summation of prior records - Independent review of image, tracing or specimen

## 2021-12-23 ENCOUNTER — Encounter: Payer: Self-pay | Admitting: Obstetrics and Gynecology

## 2022-02-01 ENCOUNTER — Other Ambulatory Visit (HOSPITAL_COMMUNITY)
Admission: RE | Admit: 2022-02-01 | Discharge: 2022-02-01 | Disposition: A | Discharge status: Home / Self Care | Payer: Managed Care, Other (non HMO) | Source: Ambulatory Visit | Attending: Obstetrics & Gynecology | Admitting: Obstetrics & Gynecology

## 2022-02-01 ENCOUNTER — Ambulatory Visit (INDEPENDENT_AMBULATORY_CARE_PROVIDER_SITE_OTHER): Payer: Managed Care, Other (non HMO) | Admitting: Obstetrics & Gynecology

## 2022-02-01 VITALS — BP 108/60 | Wt 184.0 lb

## 2022-02-01 DIAGNOSIS — N898 Other specified noninflammatory disorders of vagina: Secondary | ICD-10-CM | POA: Insufficient documentation

## 2022-02-01 DIAGNOSIS — M545 Low back pain, unspecified: Secondary | ICD-10-CM | POA: Diagnosis not present

## 2022-02-01 DIAGNOSIS — N760 Acute vaginitis: Secondary | ICD-10-CM | POA: Diagnosis not present

## 2022-02-01 LAB — POCT URINALYSIS DIPSTICK
Bilirubin, UA: NEGATIVE
Blood, UA: NEGATIVE
Glucose, UA: NEGATIVE
Ketones, UA: NEGATIVE
Leukocytes, UA: NEGATIVE
Nitrite, UA: NEGATIVE
Protein, UA: NEGATIVE
Spec Grav, UA: <=1.005 — AB (ref 1.010–1.025)
Urobilinogen, UA: 0.2 E.U./dL
pH, UA: 6 (ref 5.0–8.0)

## 2022-02-02 ENCOUNTER — Encounter: Payer: Self-pay | Admitting: Obstetrics & Gynecology

## 2022-02-02 DIAGNOSIS — N760 Acute vaginitis: Secondary | ICD-10-CM | POA: Insufficient documentation

## 2022-02-02 NOTE — Progress Notes (Signed)
Subjective:     Deborah Mccarthy is a 41 y.o. female here for a routine exam.  Current complaints: vaginal itching,burning, irritation, and vaginal discharge.  Personal health questionnaire reviewed: yes.   Gynecologic History Patient's last menstrual period was 01/21/2022 (exact date). Contraception: tubal ligation Last Pap: 08/2021. Results were: HPV-neg, ASCUS Last mammogram: . Results were: normal5/2022  Obstetric History OB History  Gravida Para Term Preterm AB Living  '5 3 3   2 3  '$ SAB IAB Ectopic Multiple Live Births  1 1   0 3    # Outcome Date GA Lbr Len/2nd Weight Sex Delivery Anes PTL Lv  5 Term 07/06/21 26w2d00:58 / 03:16 6 lb 3.1 oz (2.81 kg) M Vag-Spont EPI  LIV  4 Term 03/13/14 432w0d7 lb 3.2 oz (3.266 kg)  Vag-Spont     3 Term 01/02/04   7 lb 8 oz (3.402 kg) F Vag-Spont     2 IAB           1 SAB            Assessment:   Vaginitis    Plan:    Nu-swab done today    PRN RTO

## 2022-02-03 LAB — CERVICOVAGINAL ANCILLARY ONLY
Bacterial Vaginitis (gardnerella): POSITIVE — AB
Candida Glabrata: NEGATIVE
Candida Vaginitis: NEGATIVE
Chlamydia: NEGATIVE
Comment: NEGATIVE
Comment: NEGATIVE
Comment: NEGATIVE
Comment: NEGATIVE
Comment: NEGATIVE
Comment: NORMAL
Neisseria Gonorrhea: NEGATIVE
Trichomonas: NEGATIVE

## 2022-02-04 ENCOUNTER — Encounter: Payer: Self-pay | Admitting: Obstetrics and Gynecology

## 2022-02-04 ENCOUNTER — Telehealth: Payer: Self-pay

## 2022-02-04 ENCOUNTER — Ambulatory Visit (INDEPENDENT_AMBULATORY_CARE_PROVIDER_SITE_OTHER): Payer: Managed Care, Other (non HMO) | Admitting: Dermatology

## 2022-02-04 DIAGNOSIS — L720 Epidermal cyst: Secondary | ICD-10-CM | POA: Diagnosis not present

## 2022-02-04 DIAGNOSIS — L811 Chloasma: Secondary | ICD-10-CM | POA: Diagnosis not present

## 2022-02-04 DIAGNOSIS — I781 Nevus, non-neoplastic: Secondary | ICD-10-CM

## 2022-02-04 DIAGNOSIS — L820 Inflamed seborrheic keratosis: Secondary | ICD-10-CM | POA: Diagnosis not present

## 2022-02-04 MED ORDER — NIACINAMIDE POWD: 1.0000 | Freq: Every day | 2 refills | Status: DC

## 2022-02-04 NOTE — Telephone Encounter (Signed)
Left pt message to please take some close up photos of her cheeks, forehead, nose and chin and upload them into her mychart.  I advised we needed some photos before starting the Hydroquinone cream./sh

## 2022-02-04 NOTE — Telephone Encounter (Signed)
Pt calling; has seen her results of BV; has not heard anything about a rx; is rather uncomfortable.  Pharm is correct in chart.  Adv will send to provider.

## 2022-02-04 NOTE — Progress Notes (Unsigned)
New Patient Visit  Subjective  Deborah Mccarthy is a 41 y.o. female who presents for the following: check spots (L neck, bump, ~69yr, growing/Growth R buttocks, ~89yrdeveloped during pregnancy, gets caught on clothes, itchy prn) and Skin tone (Face, pt feels like she has different tones on face).  [facility] The following portions of the chart were reviewed this encounter and updated as appropriate:   Tobacco  Allergies  Meds  Problems  Med Hx  Surg Hx  Fam Hx     Review of Systems:  No other skin or systemic complaints except as noted in HPI or Assessment and Plan.  Objective  Well appearing patient in no apparent distress; mood and affect are within normal limits.  A focused examination was performed including face, neck, buttocks. Relevant physical exam findings are noted in the Assessment and Plan.  L neck Cystic pap 1.0 x 0.5cm  R med buttocks superior x 1, R med buttocs inferior x 1, L low back x 1 (3) Stuck on waxy paps with erythema 0.6cm R med buttocks superior 0.4cm R med buttocks inferior L low back  L cheek Dilated blood vessel  face Reticulated hyperpigmented patches.    Assessment & Plan  Epidermal cyst L neck Benign-appearing. Exam most consistent with an epidermal inclusion cyst. Discussed that a cyst is a benign growth that can grow over time and sometimes get irritated or inflamed. Recommend observation if it is not bothersome. Discussed option of surgical excision to remove it if it is growing, symptomatic, or other changes noted. Please call for new or changing lesions so they can be evaluated.  Inflamed seborrheic keratosis (3) R med buttocks superior x 1, R med buttocs inferior x 1, L low back x 1 Vs Warts Symptomatic, irritating, patient would like treated. Destruction of lesion - R med buttocks superior x 1, R med buttocks inferior x 1, L low back x 1 Complexity: simple   Destruction method: cryotherapy    Informed consent: discussed and consent obtained   Timeout:  patient name, date of birth, surgical site, and procedure verified Lesion destroyed using liquid nitrogen: Yes   Region frozen until ice ball extended beyond lesion: Yes   Outcome: patient tolerated procedure well with no complications   Post-procedure details: wound care instructions given    Telangiectasia L cheek Benign Discussed the treatment option of BBL/laser.  Typically we recommend 1-3 treatment sessions about 5-8 weeks apart for best results.  The patient's condition may require "maintenance treatments" in the future.  The fee for BBL / laser treatments is $350 per treatment session for the whole face.  A fee can be quoted for other parts of the body. Insurance typically does not pay for BBL/laser treatments and therefore the fee is an out-of-pocket cost.  Discussed BBL for 1 spot $200 per txt  Melasma face Melasma is a chronic; persistent condition of hyperpigmented patches generally on the face, worse in summer due to higher UV exposure.    Heredity; thyroid disease; sun exposure; pregnancy; birth control pills; epilepsy medication and darker skin may predispose to Melasma.   Recommendations include: - Sun avoidance and daily broad spectrum (UVA/UVB) sunscreen SPF 30+, preferably with Zinc or Titanium Dioxide. - Rx topical bleaching creams (i.e. hydroquinone) is a common treatment but should not be used long term.  Hydroquinones may be mixed with retinoids; steroids; Kojic Acid. - Rx Azelaic Acid is also a treatment option that is safe for pregnancy (  Category B). - OTC Heliocare can be helpful in control and prevention. - Oral Rx with Tranexamic Acid 250 mg - 650 mg po daily can be used for moderate to severe cases especially during summer (contraindications include pregnancy; lactation; hx of PE; DVT; clotting disorder; heart disease; anticoagulant use and upcoming long trips)   - Chemical peels (would need multiple  for best result).  - Lasers and  Microdermabrasion may also be helpful adjunct treatments.  Start SM Hydroquinone 12% / Kojic Acid 6% / Niacinamide 2% / Vitamin C 1% Cream qhs to dark spots on face for up to 3 months  Niacinamide POWD - face Apply 1 Application topically at bedtime. Qhs to dark spots on face for up to 3 months  Return for surgery Cyst L neck, f/u ISKs and Melasma.  I, Othelia Pulling, RMA, am acting as scribe for Sarina Ser, MD . Documentation: I have reviewed the above documentation for accuracy and completeness, and I agree with the above.  Sarina Ser, MD

## 2022-02-04 NOTE — Patient Instructions (Addendum)
Pre-Operative Instructions  You are scheduled for a surgical procedure at [facility]. We recommend you read the following instructions. If you have any questions or concerns, please call the office at [phone].  Shower and wash the entire body with soap and water the day of your surgery paying special attention to cleansing at and around the planned surgery site.  Avoid aspirin or aspirin containing products at least fourteen (14) days prior to your surgical procedure and for at least one week (7 Days) after your surgical procedure. If you take aspirin on a regular basis for heart disease or history of stroke or for any other reason, we may recommend you continue taking aspirin but please notify us if you take this on a regular basis. Aspirin can cause more bleeding to occur during surgery as well as prolonged bleeding and bruising after surgery.   Avoid other nonsteroidal pain medications at least one week prior to surgery and at least one week prior to your surgery. These include medications such as Ibuprofen (Motrin, Advil and Nuprin), Naprosyn, Voltaren, Relafen, etc. If medications are used for therapeutic reasons, please inform us as they can cause increased bleeding or prolonged bleeding during and bruising after surgical procedures.   Please advise [place] if you are taking any "blood thinner" medications such as Coumadin or Dipyridamole or Plavix or similar medications. These cause increased bleeding and prolonged bleeding during procedures and bruising after surgical procedures. We may have to consider discontinuing these medications briefly prior to and shortly after your surgery if safe to do so.   Please inform us of all medications you are currently taking. All medications that are taken regularly should be taken the day of surgery as you always do. Nevertheless, we need to be informed of what medications you are taking prior to surgery to know whether they will affect the  procedure or cause any complications.   Please inform us of any medication allergies. Also inform us of whether you have allergies to Latex or rubber products or whether you have had any adverse reaction to Lidocaine or Epinephrine.  Please inform us of any prosthetic or artificial body parts such as artificial heart valve, joint replacements, etc., or similar condition that might require preoperative antibiotics.   We recommend avoidance of alcohol at least two weeks prior to surgery and continued avoidance for at least two weeks after surgery.   We recommend discontinuation of tobacco smoking at least two weeks prior to surgery and continued abstinence for at least two weeks after surgery.  Do not plan strenuous exercise, strenuous work or strenuous lifting for approximately four weeks after your surgery.   We request if you are unable to make your scheduled surgical appointment, please call us at least a week in advance or as soon as you are aware of a problem so that we can cancel or reschedule the appointment.   You MAY TAKE TYLENOL (acetaminophen) for pain as it is not a blood thinner.   PLEASE PLAN TO BE IN TOWN FOR TWO WEEKS FOLLOWING SURGERY, THIS IS IMPORTANT SO YOU CAN BE CHECKED FOR DRESSING CHANGES, SUTURE REMOVAL AND TO MONITOR FOR POSSIBLE COMPLICATIONS.      Cryotherapy Aftercare  Wash gently with soap and water everyday.   Apply Vaseline and Band-Aid daily until healed.   Melasma  Melasma is a chronic; persistent condition of hyperpigmented patches generally on the face, worse in summer due to higher UV exposure.    Heredity; thyroid disease; sun  exposure; pregnancy; birth control pills; epilepsy medication and darker skin may predispose to Melasma.   Recommendations include: - Sun avoidance and daily broad spectrum (UVA/UVB) sunscreen SPF 30+, preferably with Zinc or Titanium Dioxide. - Rx topical bleaching creams (i.e. hydroquinone) is a common treatment but should  not be used long term.  Hydroquinones may be mixed with retinoids; steroids; Kojic Acid. - Rx Azelaic Acid is also a treatment option that is safe for pregnancy (Category B). - OTC Heliocare can be helpful in control and prevention. - Oral Rx with Tranexamic Acid 250 mg - 650 mg po daily can be used for moderate to severe cases especially during summer (contraindications include pregnancy; lactation; hx of PE; DVT; clotting disorder; heart disease; anticoagulant use and upcoming long trips)   - Chemical peels (would need multiple for best result).  - Lasers and  Microdermabrasion may also be helpful adjunct treatments.    Instructions for [facility] Medications  One or more of your medications was sent to the [facility] mail order compounding pharmacy. You will receive an email from them and can purchase the medicine through that link. It will then be mailed to your home at the address you confirmed. If for any reason you do not receive an email from them, please check your spam folder. If you still do not find the email, please let us know. [facility] phone number is [phone].     Due to recent changes in healthcare laws, you may see results of your pathology and/or laboratory studies on MyChart before the doctors have had a chance to review them. We understand that in some cases there may be results that are confusing or concerning to you. Please understand that not all results are received at the same time and often the doctors may need to interpret multiple results in order to provide you with the best plan of care or course of treatment. Therefore, we ask that you please give [place] 2 business days to thoroughly review all your results before contacting the office for clarification. Should we see a critical lab result, you will be contacted sooner.   If You Need Anything After Your Visit  If you have any questions or concerns for your doctor, please call our main line at  [phone] and press option 4 to reach your doctor's medical assistant. If no one answers, please leave a voicemail as directed and we will return your call as soon as possible. Messages left after 4 pm will be answered the following business day.   You may also send [place] a message via [place]. We typically respond to MyChart messages within 1-2 business days.  For prescription refills, please ask your pharmacy to contact our office. Our fax number is [phone].  If you have an urgent issue when the clinic is closed that cannot wait until the next business day, you can page your doctor at the number below.    Please note that while we do our best to be available for urgent issues outside of office hours, we are not available 24/7.   If you have an urgent issue and are unable to reach [place], you may choose to seek medical care at your doctor's office, retail clinic, urgent care center, or emergency room.  If you have a medical emergency, please immediately call 911 or go to the emergency department.  Pager Numbers  - Dr. Nehemiah Massed: [phone]  - Dr. Laurence Ferrari: [phone]  - Dr. Nicole Kindred: [phone]  In the event  of inclement weather, please call our main line at [phone] for an update on the status of any delays or closures.  Dermatology Medication Tips: Please keep the boxes that topical medications come in in order to help keep track of the instructions about where and how to use these. Pharmacies typically print the medication instructions only on the boxes and not directly on the medication tubes.   If your medication is too expensive, please contact our office at [phone] option 4 or send [place] a message through [place].   We are unable to tell what your co-pay for medications will be in advance as this is different depending on your insurance coverage. However, we may be able to find a substitute medication at lower cost or fill out paperwork to get insurance to cover a needed  medication.   If a prior authorization is required to get your medication covered by your insurance company, please allow [place] 1-2 business days to complete this process.  Drug prices often vary depending on where the prescription is filled and some pharmacies may offer cheaper prices.  The website www.goodrx.com contains coupons for medications through different pharmacies. The prices here do not account for what the cost may be with help from insurance (it may be cheaper with your insurance), but the website can give you the price if you did not use any insurance.  - You can print the associated coupon and take it with your prescription to the pharmacy.  - You may also stop by our office during regular business hours and pick up a GoodRx coupon card.  - If you need your prescription sent electronically to a different pharmacy, notify our office through [facility] or by phone at [phone] option 4.     Si Usted Necesita Algo Despus de Su Visita  Tambin puede enviarnos un mensaje a travs de Pharmacist, community. Por lo general respondemos a los mensajes de MyChart en el transcurso de 1 a 2 das hbiles.  Para renovar recetas, por favor pida a su farmacia que se ponga en contacto con nuestra oficina. Harland Dingwall de fax es [place] [phone].  Si tiene un asunto urgente cuando la clnica est cerrada y que no puede esperar hasta el siguiente da hbil, puede llamar/localizar a su doctor(a) al nmero que aparece a continuacin.   Por favor, tenga en cuenta que aunque hacemos todo lo posible para estar disponibles para asuntos urgentes fuera del horario de [place], no estamos disponibles las 24 horas del da, los 7 das de la [place].   Si tiene un problema urgente y no puede comunicarse con nosotros, puede optar por buscar atencin mdica  en el consultorio de su doctor(a), en una clnica privada, en un centro de atencin urgente o en una sala de emergencias.  Si tiene Engineering geologist,  por favor llame inmediatamente al 911 o vaya a la sala de emergencias.  Nmeros de bper  - Dr. Nehemiah Massed: [phone]  - Dra. Moye: [phone]  - Dra. Nicole Kindred: [phone]  En caso de inclemencias del [place], por favor llame a Johnsie Kindred principal al [phone] para una actualizacin sobre el [place] de cualquier retraso o cierre.  Consejos para la medicacin en dermatologa: Por favor, guarde las cajas en las que vienen los medicamentos de uso tpico para ayudarle a seguir las instrucciones sobre dnde y cmo usarlos. Las farmacias generalmente imprimen las instrucciones del medicamento slo en las cajas y no directamente en los tubos del [place].   Si su medicamento  es muy caro, por favor, pngase en contacto con Zigmund Daniel llamando al [phone] y presione la opcin 4 o envenos un mensaje a travs de Pharmacist, community.   No podemos decirle cul ser su copago por los medicamentos por adelantado ya que esto es diferente dependiendo de la cobertura de su seguro. Sin embargo, es posible que podamos encontrar un medicamento sustituto a Electrical engineer un formulario para que el seguro cubra el medicamento que se considera necesario.   Si se requiere una autorizacin previa para que su compaa de seguros [place] su medicamento, por favor permtanos de 1 a 2 das hbiles para completar este proceso.  Los precios de los medicamentos varan con frecuencia dependiendo del Environmental consultant de dnde se surte la receta y alguna farmacias pueden ofrecer precios ms baratos.  El sitio web www.goodrx.com tiene cupones para medicamentos de Airline pilot. Los precios aqu no tienen en cuenta lo que podra costar con la ayuda del seguro (puede ser ms barato con su seguro), pero el sitio web puede darle el precio si no utiliz Research scientist (physical sciences).  - Puede imprimir el cupn correspondiente y llevarlo con su receta a la farmacia.  - Tambin puede pasar por nuestra oficina durante el horario de atencin  regular y Charity fundraiser una tarjeta de cupones de GoodRx.  - Si necesita que su receta se enve electrnicamente a una farmacia diferente, informe a nuestra oficina a travs de MyChart de [place] o por telfono llamando al [phone] y presione la opcin 4.

## 2022-02-05 ENCOUNTER — Other Ambulatory Visit: Payer: Self-pay

## 2022-02-05 MED ORDER — METRONIDAZOLE 500 MG PO TABS: 500.0000 mg | ORAL_TABLET | Freq: Two times a day (BID) | ORAL | 0 refills | Status: AC

## 2022-02-05 NOTE — Telephone Encounter (Signed)
Dr. Langley Gauss has reviewed over results and Flagyl was sent in today. KW

## 2022-02-05 NOTE — Progress Notes (Signed)
Erroneous entry.  Disregard.

## 2022-02-07 ENCOUNTER — Encounter: Payer: Self-pay | Admitting: Dermatology

## 2022-03-09 ENCOUNTER — Ambulatory Visit: Payer: Managed Care, Other (non HMO) | Admitting: Dermatology

## 2022-03-09 DIAGNOSIS — L720 Epidermal cyst: Secondary | ICD-10-CM

## 2022-03-09 DIAGNOSIS — D485 Neoplasm of uncertain behavior of skin: Secondary | ICD-10-CM

## 2022-03-09 MED ORDER — MUPIROCIN 2 % EX OINT: 1.0000 | TOPICAL_OINTMENT | Freq: Every day | CUTANEOUS | 1 refills | Status: DC

## 2022-03-09 NOTE — Patient Instructions (Signed)

## 2022-03-09 NOTE — Progress Notes (Signed)
   Follow-Up Visit   Subjective  Deborah Mccarthy is a 41 y.o. female who presents for the following: Cyst (Left neck - Excise today).  The following portions of the chart were reviewed this encounter and updated as appropriate:   Tobacco  Allergies  Meds  Problems  Med Hx  Surg Hx  Fam Hx     Review of Systems:  No other skin or systemic complaints except as noted in HPI or Assessment and Plan.  Objective  Well appearing patient in no apparent distress; mood and affect are within normal limits.  A focused examination was performed including neck. Relevant physical exam findings are noted in the Assessment and Plan.  Left neck Cystic papule 1.2cm   Assessment & Plan  Neoplasm of uncertain behavior of skin Left neck  Skin excision  Lesion length (cm):  1.2 Lesion width (cm):  1.2 Margin per side (cm):  0 Total excision diameter (cm):  1.2 Informed consent: discussed and consent obtained   Timeout: patient name, date of birth, surgical site, and procedure verified   Procedure prep:  Patient was prepped and draped in usual sterile fashion Prep type:  Isopropyl alcohol and povidone-iodine Anesthesia: the lesion was anesthetized in a standard fashion   Anesthetic:  1% lidocaine w/ epinephrine 1-100,000 buffered w/ 8.4% NaHCO3 (3cc lido w/ epi, 3cc bupivicaine, Total of 6cc) Instrument used comment:  #15c blade Hemostasis achieved with: pressure   Hemostasis achieved with comment:  Electrocautery Outcome: patient tolerated procedure well with no complications   Post-procedure details: sterile dressing applied and wound care instructions given   Dressing type: bandage, pressure dressing and bacitracin (mupirocin)    Skin repair Complexity:  Complex Final length (cm):  2.2 Reason for type of repair: reduce tension to allow closure, reduce the risk of dehiscence, infection, and necrosis, reduce subcutaneous dead space and avoid a hematoma, allow closure of the large defect,  preserve normal anatomy, preserve normal anatomical and functional relationships and enhance both functionality and cosmetic results   Undermining: area extensively undermined   Undermining comment:  Undermining defect 1.2cm Subcutaneous layers (deep stitches):  Suture size:  5-0 Suture type: Vicryl (polyglactin 910)   Subcutaneous suture technique: inverted dermal. Fine/surface layer approximation (top stitches):  Suture size:  5-0 Suture type: nylon   Stitches: simple running   Suture removal (days):  7 Hemostasis achieved with: suture and pressure Outcome: patient tolerated procedure well with no complications   Post-procedure details: sterile dressing applied and wound care instructions given   Dressing type: bandage, pressure dressing and bacitracin (mupirocin)    mupirocin ointment (BACTROBAN) 2 % Apply 1 Application topically daily. Qd to excision site  Specimen 1 - Surgical pathology Differential Diagnosis: Cyst vs other Check Margins: No  Cyst vs other, excised today Start Mupirocin oint qd to excision site   Return in about 1 week (around 03/16/2022) for suture removal.  I, Othelia Pulling, RMA, am acting as scribe for Sarina Ser, MD . Documentation: I have reviewed the above documentation for accuracy and completeness, and I agree with the above.  Sarina Ser, MD

## 2022-03-10 ENCOUNTER — Telehealth: Payer: Self-pay

## 2022-03-10 NOTE — Telephone Encounter (Signed)
Pt doing fine after yesterdays surgery./sh 

## 2022-03-16 ENCOUNTER — Ambulatory Visit (INDEPENDENT_AMBULATORY_CARE_PROVIDER_SITE_OTHER): Payer: Managed Care, Other (non HMO) | Admitting: Dermatology

## 2022-03-16 DIAGNOSIS — L720 Epidermal cyst: Secondary | ICD-10-CM

## 2022-03-16 DIAGNOSIS — Z4802 Encounter for removal of sutures: Secondary | ICD-10-CM

## 2022-03-16 NOTE — Progress Notes (Signed)
   Follow-Up Visit   Subjective  Deborah Mccarthy is a 41 y.o. female who presents for the following: cyst bx proven (L neck, pt presents for suture removal).  The following portions of the chart were reviewed this encounter and updated as appropriate:   Tobacco  Allergies  Meds  Problems  Med Hx  Surg Hx  Fam Hx     Review of Systems:  No other skin or systemic complaints except as noted in HPI or Assessment and Plan.  Objective  Well appearing patient in no apparent distress; mood and affect are within normal limits.  A focused examination was performed including left neck. Relevant physical exam findings are noted in the Assessment and Plan.  L neck Healing excision site   Assessment & Plan  Epidermoid cyst L neck  Bx proven Healing excision site  Encounter for Removal of Sutures - Incision site at the left neck is clean, dry and intact - Wound cleansed, sutures removed, wound cleansed and steri strips applied.  - Discussed pathology results showing epidermoid cyst  - Patient advised to keep steri-strips dry until they fall off. - Scars remodel for a full year. - Once steri-strips fall off, patient can apply over-the-counter silicone scar cream each night to help with scar remodeling if desired. - Patient advised to call with any concerns or if they notice any new or changing lesions.    Return in about 3 months (around 06/16/2022) for ISK f/u, Melasma f/u.  I, Othelia Pulling, RMA, am acting as scribe for Sarina Ser, MD . Documentation: I have reviewed the above documentation for accuracy and completeness, and I agree with the above.  Sarina Ser, MD

## 2022-03-16 NOTE — Patient Instructions (Signed)
Due to recent changes in healthcare laws, you may see results of your pathology and/or laboratory studies on MyChart before the doctors have had a chance to review them. We understand that in some cases there may be results that are confusing or concerning to you. Please understand that not all results are received at the same time and often the doctors may need to interpret multiple results in order to provide you with the best plan of care or course of treatment. Therefore, we ask that you please give us 2 business days to thoroughly review all your results before contacting the office for clarification. Should we see a critical lab result, you will be contacted sooner.   If You Need Anything After Your Visit  If you have any questions or concerns for your doctor, please call our main line at [phone] and press option 4 to reach your doctor's medical assistant. If no one answers, please leave a voicemail as directed and we will return your call as soon as possible. Messages left after 4 pm will be answered the following business day.   You may also send us a message via MyChart. We typically respond to MyChart messages within 1-2 business days.  For prescription refills, please ask your pharmacy to contact our office. Our fax number is [phone].  If you have an urgent issue when the clinic is closed that cannot wait until the next business day, you can page your doctor at the number below.    Please note that while we do our best to be available for urgent issues outside of office hours, we are not available 24/7.   If you have an urgent issue and are unable to reach us, you may choose to seek medical care at your doctor's office, retail clinic, urgent care center, or emergency room.  If you have a medical emergency, please immediately call 911 or go to the emergency department.  Pager Numbers  - Dr. Kowalski: [phone]  - Dr. Moye: [phone]  - Dr. Stewart:  [phone]  In the event of inclement weather, please call our main line at [phone] for an update on the status of any delays or closures.  Dermatology Medication Tips: Please keep the boxes that topical medications come in in order to help keep track of the instructions about where and how to use these. Pharmacies typically print the medication instructions only on the boxes and not directly on the medication tubes.   If your medication is too expensive, please contact our office at [phone] option 4 or send us a message through MyChart.   We are unable to tell what your co-pay for medications will be in advance as this is different depending on your insurance coverage. However, we may be able to find a substitute medication at lower cost or fill out paperwork to get insurance to cover a needed medication.   If a prior authorization is required to get your medication covered by your insurance company, please allow us 1-2 business days to complete this process.  Drug prices often vary depending on where the prescription is filled and some pharmacies may offer cheaper prices.  The website www.goodrx.com contains coupons for medications through different pharmacies. The prices here do not account for what the cost may be with help from insurance (it may be cheaper with your insurance), but the website can give you the price if you did not use any insurance.  - You can print the associated coupon and take it with   your prescription to the pharmacy.  - You may also stop by our office during regular business hours and pick up a GoodRx coupon card.  - If you need your prescription sent electronically to a different pharmacy, notify our office through [facility] MyChart or by phone at [phone] option 4.     Si Usted Necesita Algo Despus de Su Visita  Tambin puede enviarnos un mensaje a travs de MyChart. Por lo general respondemos a los mensajes de MyChart en el transcurso de 1 a 2  das hbiles.  Para renovar recetas, por favor pida a su farmacia que se ponga en contacto con nuestra oficina. Nuestro nmero de fax es el [phone].  Si tiene un asunto urgente cuando la clnica est cerrada y que no puede esperar hasta el siguiente da hbil, puede llamar/localizar a su doctor(a) al nmero que aparece a continuacin.   Por favor, tenga en cuenta que aunque hacemos todo lo posible para estar disponibles para asuntos urgentes fuera del horario de oficina, no estamos disponibles las 24 horas del da, los 7 das de la semana.   Si tiene un problema urgente y no puede comunicarse con nosotros, puede optar por buscar atencin mdica  en el consultorio de su doctor(a), en una clnica privada, en un centro de atencin urgente o en una sala de emergencias.  Si tiene una emergencia mdica, por favor llame inmediatamente al 911 o vaya a la sala de emergencias.  Nmeros de bper  - Dr. Kowalski: [phone]  - Dra. Moye: [phone]  - Dra. Stewart: [phone]  En caso de inclemencias del tiempo, por favor llame a nuestra lnea principal al [phone] para una actualizacin sobre el estado de cualquier retraso o cierre.  Consejos para la medicacin en dermatologa: Por favor, guarde las cajas en las que vienen los medicamentos de uso tpico para ayudarle a seguir las instrucciones sobre dnde y cmo usarlos. Las farmacias generalmente imprimen las instrucciones del medicamento slo en las cajas y no directamente en los tubos del medicamento.   Si su medicamento es muy caro, por favor, pngase en contacto con nuestra oficina llamando al [phone] y presione la opcin 4 o envenos un mensaje a travs de MyChart.   No podemos decirle cul ser su copago por los medicamentos por adelantado ya que esto es diferente dependiendo de la cobertura de su seguro. Sin embargo, es posible que podamos encontrar un medicamento sustituto a menor costo o llenar un formulario para que el  seguro cubra el medicamento que se considera necesario.   Si se requiere una autorizacin previa para que su compaa de seguros cubra su medicamento, por favor permtanos de 1 a 2 das hbiles para completar este proceso.  Los precios de los medicamentos varan con frecuencia dependiendo del lugar de dnde se surte la receta y alguna farmacias pueden ofrecer precios ms baratos.  El sitio web www.goodrx.com tiene cupones para medicamentos de diferentes farmacias. Los precios aqu no tienen en cuenta lo que podra costar con la ayuda del seguro (puede ser ms barato con su seguro), pero el sitio web puede darle el precio si no utiliz ningn seguro.  - Puede imprimir el cupn correspondiente y llevarlo con su receta a la farmacia.  - Tambin puede pasar por nuestra oficina durante el horario de atencin regular y recoger una tarjeta de cupones de GoodRx.  - Si necesita que su receta se enve electrnicamente a una farmacia diferente, informe a nuestra oficina a travs de MyChart de [facility]   o por telfono llamando al [phone] y presione la opcin 4.  

## 2022-03-19 ENCOUNTER — Encounter: Payer: Self-pay | Admitting: Dermatology

## 2022-03-22 ENCOUNTER — Encounter: Payer: Self-pay | Admitting: Dermatology

## 2022-04-05 ENCOUNTER — Ambulatory Visit
Admission: RE | Admit: 2022-04-05 | Discharge: 2022-04-05 | Disposition: A | Discharge status: Home / Self Care | Payer: Managed Care, Other (non HMO) | Source: Ambulatory Visit | Attending: Physician Assistant | Admitting: Physician Assistant

## 2022-04-05 ENCOUNTER — Other Ambulatory Visit: Payer: Self-pay | Admitting: Physician Assistant

## 2022-04-05 ENCOUNTER — Ambulatory Visit
Admission: RE | Admit: 2022-04-05 | Discharge: 2022-04-05 | Disposition: A | Discharge status: Home / Self Care | Payer: Managed Care, Other (non HMO) | Attending: Physician Assistant | Admitting: Physician Assistant

## 2022-04-05 DIAGNOSIS — R0602 Shortness of breath: Secondary | ICD-10-CM

## 2022-06-16 ENCOUNTER — Ambulatory Visit: Payer: Managed Care, Other (non HMO) | Admitting: Dermatology

## 2022-08-16 DIAGNOSIS — N898 Other specified noninflammatory disorders of vagina: Secondary | ICD-10-CM | POA: Diagnosis not present

## 2022-08-16 DIAGNOSIS — Z113 Encounter for screening for infections with a predominantly sexual mode of transmission: Secondary | ICD-10-CM | POA: Diagnosis not present

## 2022-08-16 DIAGNOSIS — L739 Follicular disorder, unspecified: Secondary | ICD-10-CM | POA: Diagnosis not present

## 2022-09-17 ENCOUNTER — Ambulatory Visit (INDEPENDENT_AMBULATORY_CARE_PROVIDER_SITE_OTHER): Payer: BC Managed Care – PPO | Admitting: Podiatry

## 2022-09-17 DIAGNOSIS — Q828 Other specified congenital malformations of skin: Secondary | ICD-10-CM | POA: Diagnosis not present

## 2022-09-17 DIAGNOSIS — M778 Other enthesopathies, not elsewhere classified: Secondary | ICD-10-CM | POA: Diagnosis not present

## 2022-09-17 NOTE — Progress Notes (Signed)
  Subjective:  Patient ID: Deborah Mccarthy, female    DOB: [birth year],  MRN: [id number]  Chief Complaint  Patient presents with   Callouses    42 y.o. female presents with the above complaint.  Patient presents with left midfoot porokeratotic lesion with underlying capsulitis pain for touch is progressive and worse hurts with ambulation or shoe pressure.  She would like to discuss treatment options.  Pain scale is 7 out of 10 hurts with ambulation.  She has not seen anyone else for a while.   Review of Systems: Negative except as noted in the HPI. Denies N/V/F/Ch.  Past Medical History:  Diagnosis Date   Abnormal Pap smear of vagina    BV (bacterial vaginosis)    Cervical dysplasia    Chlamydia    Depression    Dysmenorrhea    Gestational diabetes    Hives    Menorrhagia    Ovarian cyst    Rectal ulcer    Negative for Crohn's   Vitamin D deficiency     Current Outpatient Medications:    mupirocin ointment (BACTROBAN) 2 %, Apply 1 Application topically daily. Qd to excision site, Disp: 22 g, Rfl: 1   Niacinamide POWD, Apply 1 Application topically at bedtime. Qhs to dark spots on face for up to 3 months, Disp: 30 g, Rfl: 2   Prenatal Vit-Fe Fumarate-FA (MULTIVITAMIN-PRENATAL) 27-0.8 MG TABS tablet, Take 1 tablet by mouth daily at 12 noon. (Patient not taking: Reported on 08/27/2021), Disp: , Rfl:   Social History   Tobacco Use  Smoking Status Former   Packs/day: 1.00   Years: 15.00   Additional pack years: 0.00   Total pack years: 15.00   Types: Cigarettes   Quit date: 10/22/2020   Years since quitting: 1.9  Smokeless Tobacco Never    No Known Allergies Objective:  There were no vitals filed for this visit. There is no height or weight on file to calculate BMI. Constitutional Well developed. Well nourished.  Vascular Dorsalis pedis pulses palpable bilaterally. Posterior tibial pulses palpable bilaterally. Capillary refill normal to all digits.  No cyanosis or  clubbing noted. Pedal hair growth normal.  Neurologic Normal speech. Oriented to person, place, and time. Epicritic sensation to light touch grossly present bilaterally.  Dermatologic Nails well groomed and normal in appearance. No open wounds. No skin lesions.  Orthopedic: Pain on palpation left midfoot porokeratotic lesion noted central nucleated core noted no pinpoint bleeding noted upon debridement   Radiographs: None Assessment:   1. Porokeratosis   2. Capsulitis of left foot    Plan:  Patient was evaluated and treated and all questions answered.  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  Patient agrees with plan like to proceed with steroid injection -A steroid injection was performed at left midfoot using 1% plain Lidocaine and 10 mg of Kenalog. This was well tolerated. -I discussed shoe gear modification as well as orthotics management in the future.   No follow-ups on file.

## 2022-10-12 ENCOUNTER — Encounter: Payer: Self-pay | Admitting: Obstetrics and Gynecology

## 2022-10-12 ENCOUNTER — Ambulatory Visit (INDEPENDENT_AMBULATORY_CARE_PROVIDER_SITE_OTHER): Payer: BC Managed Care – PPO | Admitting: Obstetrics and Gynecology

## 2022-10-12 VITALS — BP 120/70 | Ht 61.0 in | Wt 181.0 lb

## 2022-10-12 DIAGNOSIS — Z01419 Encounter for gynecological examination (general) (routine) without abnormal findings: Secondary | ICD-10-CM | POA: Diagnosis not present

## 2022-10-12 DIAGNOSIS — Z124 Encounter for screening for malignant neoplasm of cervix: Secondary | ICD-10-CM

## 2022-10-12 DIAGNOSIS — R87610 Atypical squamous cells of undetermined significance on cytologic smear of cervix (ASC-US): Secondary | ICD-10-CM

## 2022-10-12 DIAGNOSIS — Z1231 Encounter for screening mammogram for malignant neoplasm of breast: Secondary | ICD-10-CM

## 2022-10-12 DIAGNOSIS — Z1151 Encounter for screening for human papillomavirus (HPV): Secondary | ICD-10-CM

## 2022-10-12 NOTE — Patient Instructions (Addendum)
I value your feedback and you entrusting us with your care. If you get a [facility] patient survey, I would appreciate you taking the time to let us know about your experience today. Thank you!  [facility]: [phone]      

## 2022-10-12 NOTE — Progress Notes (Signed)
PCP:  Doreene Nest, NP   Chief Complaint  Patient presents with   Gynecologic Exam    Noticed a boil/cyst about a month ago, gone now.     HPI:      [name] is a 42 y.o. [id number] whose LMP was Patient's last menstrual period was 10/11/2022 (exact date)., presents today for her annual examination.  Her menses are regular every 28-30 days, lasting 5 days, mod flow.  Dysmenorrhea worsening as she gets older, no meds needed. No BTB.   Sex activity: not sexually active. No vag sx. Noted boil RT labia majora a month ago that eventually drained and resolved. Still has firm area.  Last Pap: 09/14/21 Results were: ASCUS /neg HPV DNA, repeat pap due today Hx of STDs: s/p cryotx; hx of chlamydia in past  Has occas SUI, also has rectocele with BM that she has to press on to have BM. No constipation. Went to urogyn in past but didn't have time for surg/PT. Will let me know if wants pelvic PT ref.  Last mammogram: 09/25/20 Results were: normal--routine follow-up in 12 months There is a FH of breast cancer in her MGM, genetic testing not indicated. There is no FH of ovarian cancer. The patient does do self-breast exams.  Tobacco use: former smoker, occas smokes black and mild socially Alcohol use: social drinker No drug use.  Exercise: moderately active  She does get adequate calcium and Vitamin D in her diet. Hx of pre-DM, trying to lose wt, would like Rx.  Patient Active Problem List   Diagnosis Date Noted   ASCUS of cervix with negative high risk HPV 10/12/2022   Vaginitis 02/02/2022   Sterilization consult    Postpartum care following vaginal delivery 07/06/2021   Encounter for care or examination of lactating mother 07/06/2021   Gestational diabetes 05/07/2021   Supervision of high risk pregnancy, antepartum 11/14/2020   Advanced maternal age in multigravida 11/14/2020   Obesity affecting pregnancy 11/14/2020   Hyperglycemia 10/26/2017   Family history of Graves'  disease 10/26/2017   Condyloma acuminata 01/04/2017   Skin tag of female perineum 11/01/2016    Past Surgical History:  Procedure Laterality Date   CRYOTHERAPY     FINGER SURGERY     PILONIDAL CYST EXCISION     WISDOM TOOTH EXTRACTION     XI ROBOTIC ASSISTED SALPINGECTOMY Bilateral 08/11/2021   Procedure: XI ROBOTIC ASSISTED SALPINGECTOMY;  Surgeon: Natale Milch, MD;  Location: [facility] ORS;  Service: Gynecology;  Laterality: Bilateral;    Family History  Problem Relation Age of Onset   Graves' disease Mother    Coronary artery disease Father    Diabetes Father    Breast cancer Maternal Grandmother 13   Coronary artery disease Maternal Grandmother    Colon cancer Maternal Grandfather    Colon cancer Other     Social History   Socioeconomic History   Marital status: Married    Spouse name: Not on file   Number of children: 3   Years of education: Not on file   Highest education level: Not on file  Occupational History   Not on file  Tobacco Use   Smoking status: Former    Packs/day: 1.00    Years: 15.00    Additional pack years: 0.00    Total pack years: 15.00    Types: Cigarettes    Quit date: 10/22/2020    Years since quitting: 1.9   Smokeless tobacco: Never  Vaping Use  Vaping Use: Never used  Substance and Sexual Activity   Alcohol use: No    Alcohol/week: 0.0 standard drinks of alcohol   Drug use: No   Sexual activity: Not Currently    Birth control/protection: Surgical  Other Topics Concern   Not on file  Social History Narrative   Married.   3 children.   Works as a Child psychotherapist.   Enjoys reading, gardening, relaxing.    Social Determinants of Health   Financial Resource Strain: Not on file  Food Insecurity: Not on file  Transportation Needs: Not on file  Physical Activity: Not on file  Stress: Not on file  Social Connections: Not on file  Intimate Partner Violence: Not on file     Current Outpatient Medications:    ACCU-CHEK GUIDE  test strip, USE AS INSTRUCTED FOUR TIMES A DAY, Disp: , Rfl:    Blood Glucose Monitoring Suppl (ACCU-CHEK GUIDE) w/Device KIT, 4 (four) times daily. use as directed, Disp: , Rfl:    Niacinamide POWD, Apply 1 Application topically at bedtime. Qhs to dark spots on face for up to 3 months, Disp: 30 g, Rfl: 2     ROS:  Review of Systems  Constitutional:  Negative for fatigue, fever and unexpected weight change.  Respiratory:  Negative for cough, shortness of breath and wheezing.   Cardiovascular:  Negative for chest pain, palpitations and leg swelling.  Gastrointestinal:  Negative for blood in stool, constipation, diarrhea, nausea and vomiting.  Endocrine: Negative for cold intolerance, heat intolerance and polyuria.  Genitourinary:  Positive for genital sores. Negative for dyspareunia, dysuria, flank pain, frequency, hematuria, menstrual problem, pelvic pain, urgency, vaginal bleeding, vaginal discharge and vaginal pain.  Musculoskeletal:  Negative for back pain, joint swelling and myalgias.  Skin:  Negative for rash.  Neurological:  Negative for dizziness, syncope, light-headedness, numbness and headaches.  Hematological:  Negative for adenopathy.  Psychiatric/Behavioral:  Negative for agitation, confusion, sleep disturbance and suicidal ideas. The patient is not nervous/anxious.    BREAST: No symptoms   Objective: BP 120/70   Ht 5\' 1"  (1.549 m)   Wt 181 lb (82.1 kg)   LMP 10/11/2022 (Exact Date)   Breastfeeding No   BMI 34.20 kg/m    Physical Exam Constitutional:      Appearance: She is well-developed.  Genitourinary:     Vulva normal.     Right Labia: No rash, tenderness or lesions.    Left Labia: No tenderness, lesions or rash.    No vaginal discharge, erythema or tenderness.      Right Adnexa: not tender and no mass present.    Left Adnexa: not tender and no mass present.    No cervical friability or polyp.     Uterus is not enlarged or tender.  Breasts:    Right:  No mass, nipple discharge, skin change or tenderness.     Left: No mass, nipple discharge, skin change or tenderness.  Neck:     Thyroid: No thyromegaly.  Cardiovascular:     Rate and Rhythm: Normal rate and regular rhythm.     Heart sounds: Normal heart sounds. No murmur heard. Pulmonary:     Effort: Pulmonary effort is normal.     Breath sounds: Normal breath sounds.  Abdominal:     Palpations: Abdomen is soft.     Tenderness: There is no abdominal tenderness. There is no guarding or rebound.  Musculoskeletal:        General: Normal range of motion.  Cervical back: Normal range of motion.  Lymphadenopathy:     Cervical: No cervical adenopathy.  Neurological:     General: No focal deficit present.     Mental Status: She is alert and oriented to person, place, and time.     Cranial Nerves: No cranial nerve deficit.  Skin:    General: Skin is warm and dry.  Psychiatric:        Mood and Affect: Mood normal.        Behavior: Behavior normal.        Thought Content: Thought content normal.        Judgment: Judgment normal.  Vitals reviewed.    Assessment/Plan: Encounter for annual routine gynecological examination  Cervical cancer screening - Plan: Cytology - PAP  Screening for HPV (human papillomavirus) - Plan: Cytology - PAP  ASCUS of cervix with negative high risk HPV - Plan: Cytology - PAP; repeat pap today  Encounter for screening mammogram for malignant neoplasm of breast - Plan: MM 3D SCREENING MAMMOGRAM BILATERAL BREAST; pt to schedule mammo  Pt to f/u with PCP re: wt loss mgmt options.             GYN counsel breast self exam, mammography screening, adequate intake of calcium and vitamin D, diet and exercise     F/U  Return in about 1 year (around 10/12/2023).  Nachum Derossett B. Amyre Segundo, PA-C 10/12/2022 3:27 PM

## 2022-10-20 LAB — CYTOLOGY - PAP
Comment: NEGATIVE
Diagnosis: NEGATIVE
Diagnosis: REACTIVE
High risk HPV: NEGATIVE

## 2023-02-04 IMAGING — US US MFM OB FOLLOW-UP
1 series · 13 of 28 positions shown · non-contrast
Comparison: none

[Series 1: us mfm ob follow-up · 41 acquisitions, 13 frames shown]
[im 2/41]
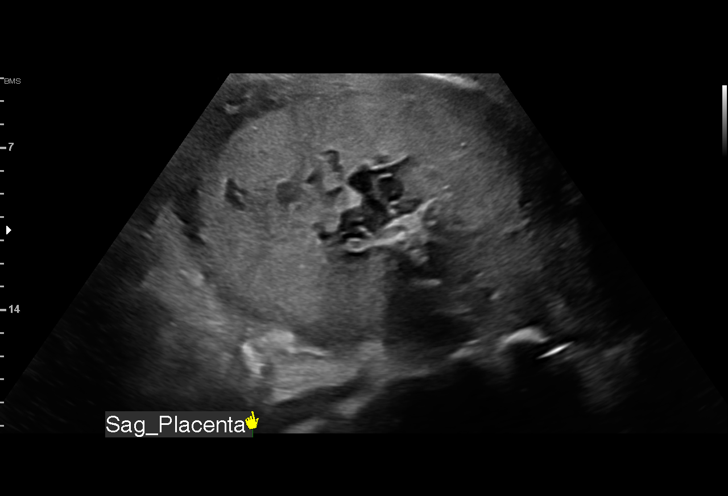
[im 5/41]
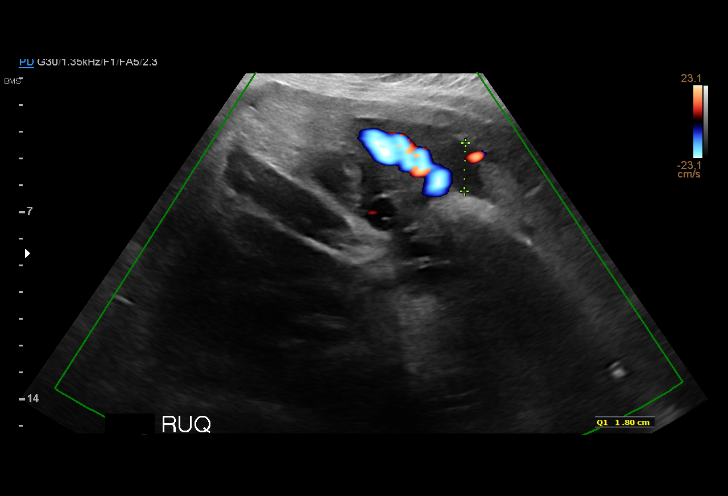
[im 8/41]
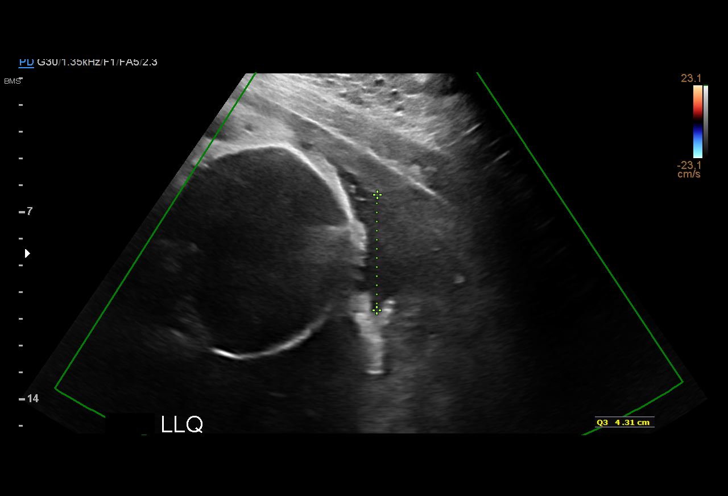
[im 11/41]
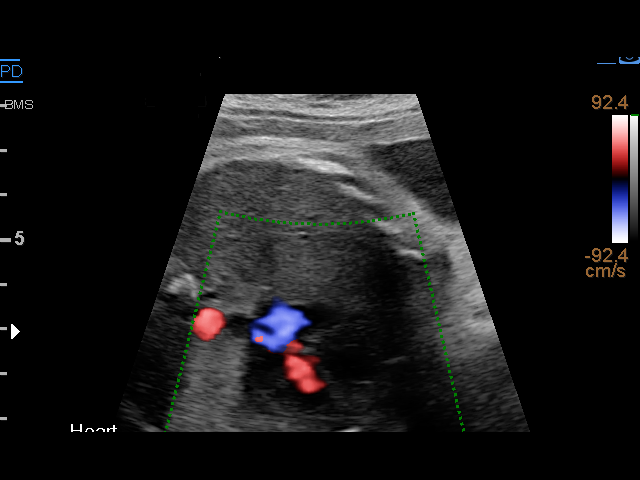
[im 14/41]
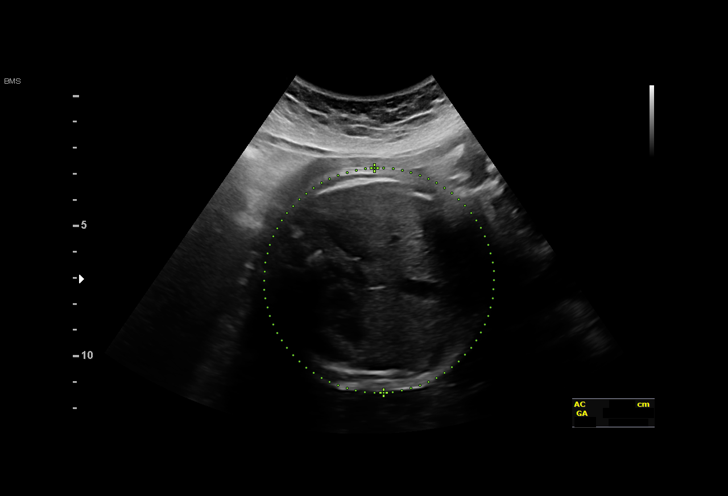
[im 17/41]
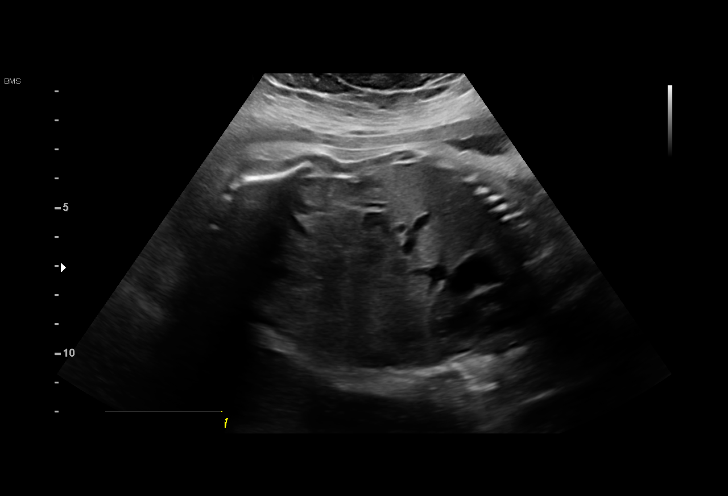
[im 21/41]
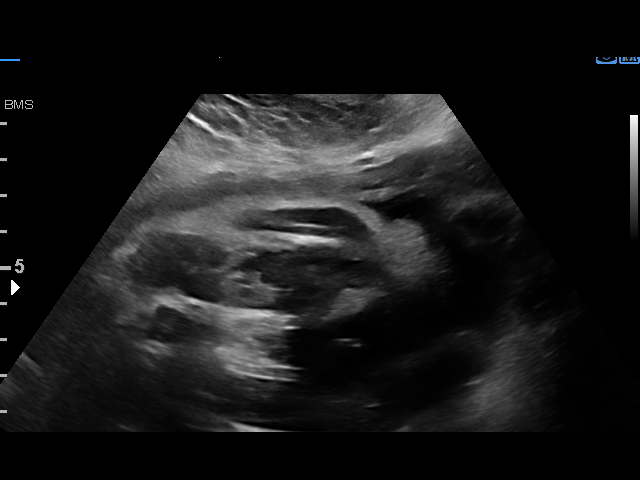
[im 24/41]
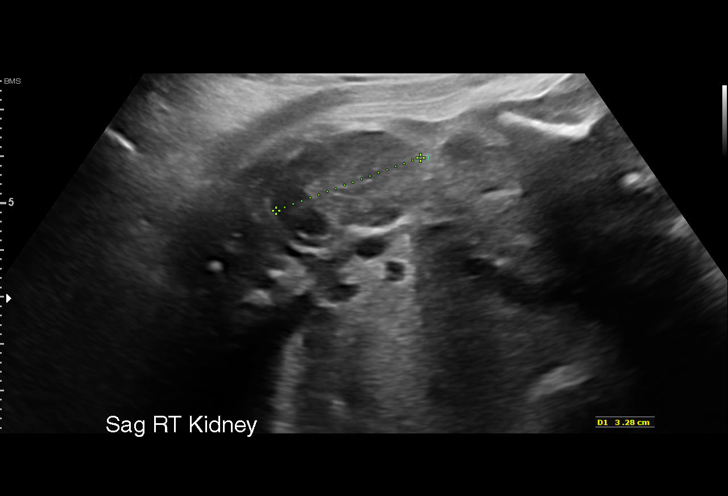
[im 27/41]
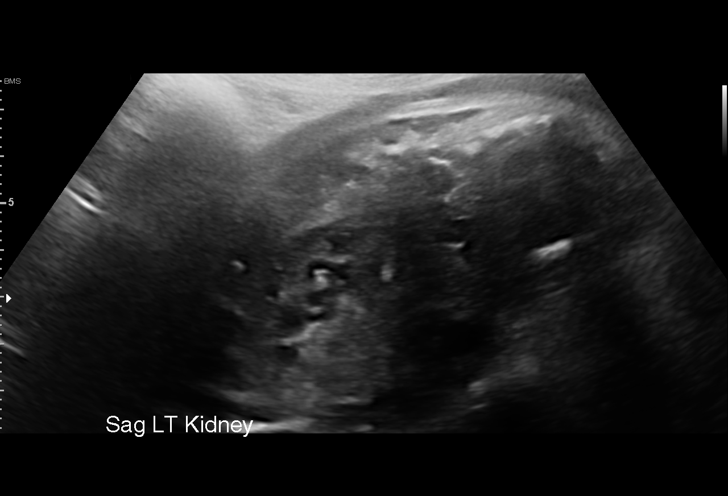
[im 30/41]
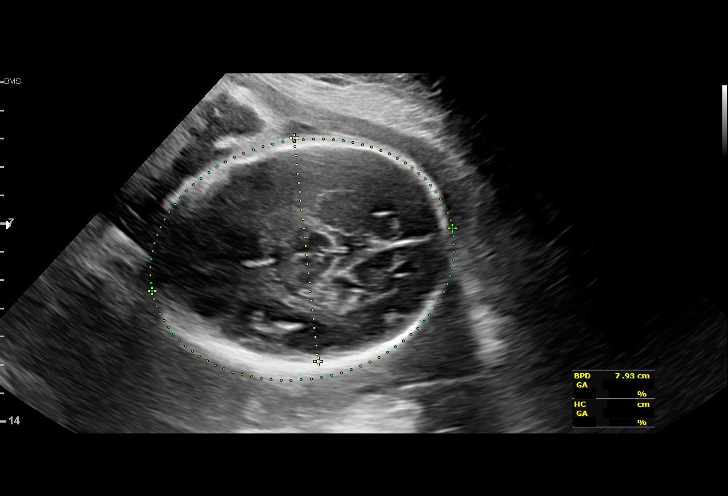
[im 33/41]
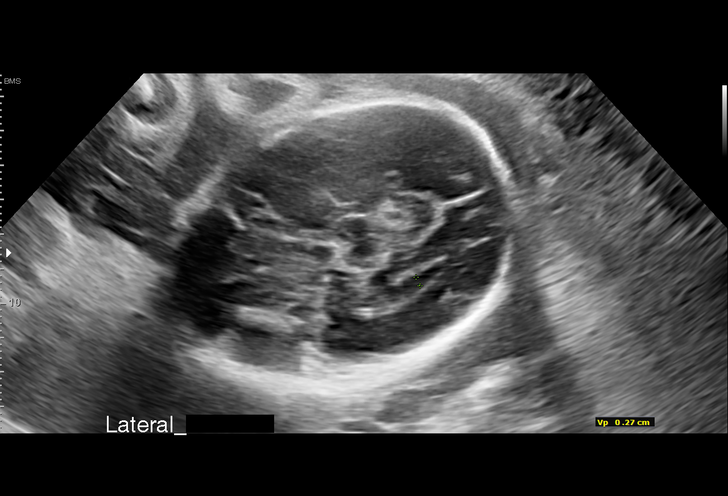
[im 36/41]
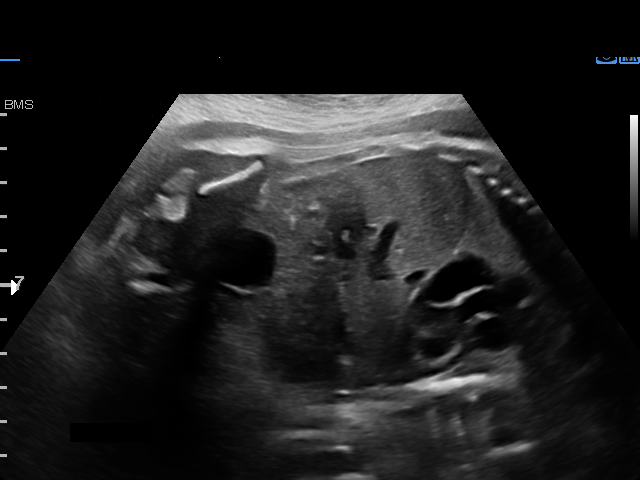
[im 39/41]
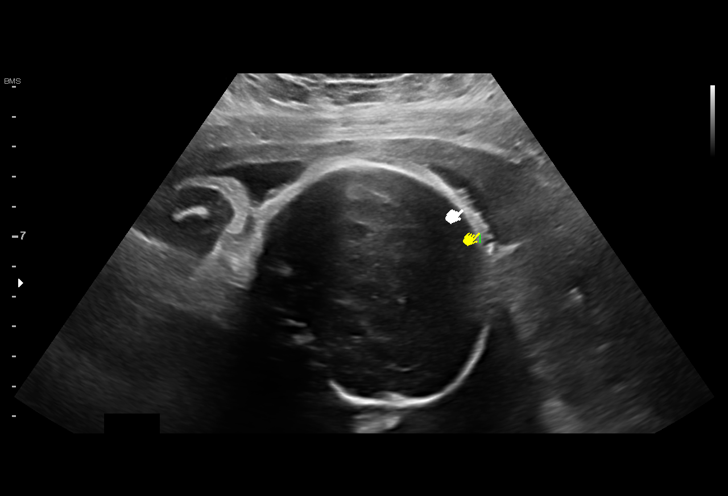

[13 of 28 positions shown; findings below may reference images not displayed]

Indications

 Gestational diabetes in pregnancy, diet
 controlled
 Advanced maternal age multigravida 35+,
 third trimester
 Obesity complicating pregnancy, third
 trimester
 31 weeks gestation of pregnancy
 Negative [id number]
Fetal Evaluation

 Num Of Fetuses:         1
 Fetal Heart Rate(bpm):  148
 Cardiac Activity:       Observed
 Presentation:           Cephalic
 Placenta:               Right lateral
 P. Cord Insertion:      Previously Visualized

 Amniotic Fluid
 AFI FV:      Within normal limits

 AFI Sum(cm)     %Tile       Largest Pocket(cm)
 12.22           33

 RUQ(cm)       RLQ(cm)       LUQ(cm)        LLQ(cm)

Biometry

 BPD:      78.5  mm     G. Age:  31w 4d         38  %    CI:        68.56   %    70 - 86
                                                         FL/HC:      19.4   %    19.1 -
 HC:       303   mm     G. Age:  33w 5d         71  %    HC/AC:      1.09        0.96 -
 AC:      277.6  mm     G. Age:  31w 6d         55  %    FL/BPD:     75.0   %    71 - 87
 FL:       58.9  mm     G. Age:  30w 5d         17  %    FL/AC:      21.2   %    20 - 24
 LV:        2.3  mm

 Est. FW:    3353  gm           4 lb     40  %
OB History

 Gravidity:    5         Term:   2        Prem:   0        SAB:   1
 TOP:          1       Ectopic:  0        Living: 2
Gestational Age

 U/S Today:     32w 0d                                        EDD:   07/08/21
 Best:          31w 4d     Det. By:  Early Ultrasound         EDD:   07/11/21
                                     (11/28/20)
Anatomy

 Cranium:               Appears normal         Aortic Arch:            Previously seen
 Cavum:                 Previously seen        Ductal Arch:            Previously seen
 Ventricles:            Appears normal         Diaphragm:              Appears normal
 Choroid Plexus:        Previously seen        Stomach:                Appears normal, left
                                                                       sided
 Cerebellum:            Previously seen        Abdomen:                Previously seen
 Posterior Fossa:       Previously seen        Abdominal Wall:         Previously seen
 Nuchal Fold:           Previously seen        Cord Vessels:           Previously seen
 Face:                  Orbits and profile     Kidneys:                Appear normal
                        previously seen
 Lips:                  Previously seen        Bladder:                Appears normal
 Thoracic:              Previously seen        Spine:                  Appears normal
 Heart:                 Previously seen        Upper Extremities:      Previously seen
 RVOT:                  Previously seen        Lower Extremities:      Previously seen
 LVOT:                  Previously seen

 Other:  Male gender previously seen.Technically difficult due to fetal position.
Cervix Uterus Adnexa

 Cervix
 Not visualized (advanced GA >12wks)
Impression

 Fetal growth is appropriate for gestational age.  Amniotic fluid
 is normal and good fetal activity seen.
 xxxxxxxxxxxxxxxxxxxxxxxxxxxxxxxxxxxxxxxxxxxxxxxxxx
 Consultation (see [REDACTED] )

 [facility] the [facility] She is G5 P2 at 31w 4d gestation
 and is here for ultrasound evaluation.
 She has a recent diagnosis of gestational diabetes.  Patient
 has recently started checking her blood glucose and reports
 they are within normal range.
 She does not have hypertension.  Blood pressure today at
 her office is 111/64 mmHg.
 Advanced maternal age.  On cell free fetal DNA screening,
 the risks of fetal aneuploidies are not increased.
 Obstetric history significant for 2 term vaginal deliveries.

 Gestational diabetes
 I explained the diagnosis of gestational diabetes.  I
 emphasized the importance of good blood glucose control to
 prevent adverse fetal or neonatal outcomes.  I discussed
 blood glucose normal values. I encouraged her to check her
 blood glucose regularly.
 Possible complications of gestational diabetes include fetal
 macrosomia, shoulder dystocia and birth injuries, stillbirth (in
 poorly controlled diabetes) and neonatal respiratory
 syndrome and other complications.

 In about 85% of cases, gestational diabetes is well controlled
 by diet alone.  Exercise reduces the need for insulin.  Medical
 treatment includes oral hypoglycemics or insulin.
 If patient requires insulin or oral hypoglycemics, we
 recommend weekly antenatal testing from 32 weeks'
 gestation till delivery.

 Timing of delivery: In well-controlled diabetes on diet, patient
 can be delivered at 39 weeks' gestation. Vaginal delivery is
 not contraindicated.
 Type 2 diabetes develops in about 25% to 40% of women
 with GDM. I recommend postpartum screening with 75-g
 glucose load at 6 to 12 weeks after delivery.
 Advanced maternal age (>40 years)
 A small increased risk of stillbirth is reported with advanced
 paternal age >40 years.  I recommend weekly BPP from 36
 weeks gestation till delivery.  Delivery should be considered
 at 39 weeks gestation.
Recommendations

 -An appointment was made for her to return in 4 weeks for
 fetal growth assessment and BPP.
 Weekly BPP from her next visit till delivery.
 -If patient requires oral hypoglycemics or insulin, we
 recommend weekly BPP till delivery.
                 Friedman, Kun

## 2023-02-17 ENCOUNTER — Ambulatory Visit: Payer: BC Managed Care – PPO | Admitting: Primary Care

## 2023-02-17 VITALS — BP 124/66 | HR 75 | Temp 98.2°F | Ht 61.0 in | Wt 185.0 lb

## 2023-02-17 DIAGNOSIS — E663 Overweight: Secondary | ICD-10-CM | POA: Insufficient documentation

## 2023-02-17 DIAGNOSIS — Z0001 Encounter for general adult medical examination with abnormal findings: Secondary | ICD-10-CM | POA: Diagnosis not present

## 2023-02-17 DIAGNOSIS — Z6834 Body mass index (BMI) 34.0-34.9, adult: Secondary | ICD-10-CM | POA: Diagnosis not present

## 2023-02-17 DIAGNOSIS — E6609 Other obesity due to excess calories: Secondary | ICD-10-CM | POA: Diagnosis not present

## 2023-02-17 DIAGNOSIS — R5382 Chronic fatigue, unspecified: Secondary | ICD-10-CM | POA: Diagnosis not present

## 2023-02-17 DIAGNOSIS — R739 Hyperglycemia, unspecified: Secondary | ICD-10-CM | POA: Diagnosis not present

## 2023-02-17 DIAGNOSIS — Z0000 Encounter for general adult medical examination without abnormal findings: Secondary | ICD-10-CM | POA: Insufficient documentation

## 2023-02-17 DIAGNOSIS — D72829 Elevated white blood cell count, unspecified: Secondary | ICD-10-CM

## 2023-02-17 DIAGNOSIS — E66811 Obesity, class 1: Secondary | ICD-10-CM | POA: Insufficient documentation

## 2023-02-17 DIAGNOSIS — Z8349 Family history of other endocrine, nutritional and metabolic diseases: Secondary | ICD-10-CM

## 2023-02-17 LAB — CBC
HCT: 43.3 % (ref 36.0–46.0)
Hemoglobin: 14 g/dL (ref 12.0–15.0)
MCHC: 32.3 g/dL (ref 30.0–36.0)
MCV: 90.3 fl (ref 78.0–100.0)
Platelets: 388 10*3/uL (ref 150.0–400.0)
RBC: 4.8 Mil/uL (ref 3.87–5.11)
RDW: 13.6 % (ref 11.5–15.5)
WBC: 10.6 10*3/uL — ABNORMAL HIGH (ref 4.0–10.5)

## 2023-02-17 LAB — COMPREHENSIVE METABOLIC PANEL
ALT: 11 U/L (ref 0–35)
AST: 10 U/L (ref 0–37)
Albumin: 4.2 g/dL (ref 3.5–5.2)
Alkaline Phosphatase: 58 U/L (ref 39–117)
BUN: 6 mg/dL (ref 6–23)
CO2: 25 mEq/L (ref 19–32)
Calcium: 9.6 mg/dL (ref 8.4–10.5)
Chloride: 107 mEq/L (ref 96–112)
Creatinine, Ser: 0.78 mg/dL (ref 0.40–1.20)
GFR: 92.58 mL/min (ref >60.00)
Glucose, Bld: 85 mg/dL (ref 70–99)
Potassium: 4.9 mEq/L (ref 3.5–5.1)
Sodium: 138 mEq/L (ref 135–145)
Total Bilirubin: 0.3 mg/dL (ref 0.2–1.2)
Total Protein: 7 g/dL (ref 6.0–8.3)

## 2023-02-17 LAB — LIPID PANEL
Cholesterol: 219 mg/dL — ABNORMAL HIGH (ref 0–200)
HDL: 39.9 mg/dL (ref >39.00)
LDL Cholesterol: 148 mg/dL — ABNORMAL HIGH (ref 0–99)
NonHDL: 178.93
Total CHOL/HDL Ratio: 5
Triglycerides: 157 mg/dL — ABNORMAL HIGH (ref 0.0–149.0)
VLDL: 31.4 mg/dL (ref 0.0–40.0)

## 2023-02-17 LAB — VITAMIN D 25 HYDROXY (VIT D DEFICIENCY, FRACTURES): VITD: 27.77 ng/mL — ABNORMAL LOW (ref 30.00–100.00)

## 2023-02-17 LAB — HEMOGLOBIN A1C: Hgb A1c MFr Bld: 6 % (ref 4.6–6.5)

## 2023-02-17 LAB — TSH: TSH: 1.32 u[IU]/mL (ref 0.35–5.50)

## 2023-02-17 LAB — VITAMIN B12: Vitamin B-12: 236 pg/mL (ref 211–911)

## 2023-02-17 MED ORDER — SEMAGLUTIDE-WEIGHT MANAGEMENT 0.25 MG/0.5ML ~~LOC~~ SOAJ: SUBCUTANEOUS | 0 refills | Status: DC

## 2023-02-17 NOTE — Assessment & Plan Note (Signed)
Time spent today discussing her current diet and exercise regimen.  Discussed options including referral to healthy weight and wellness center.  She is prefers to start GLP-1 agonist treatment.  Discussed potential side effects, instructions for administration and dosing.  Start semaglutide [facility]) forweight loss. Start by injecting 0.25 mg into the skin once weekly for 4 weeks, then increase to 0.5 mg once weekly thereafter.   Follow up in 3 months.

## 2023-02-17 NOTE — Assessment & Plan Note (Signed)
Thyroid levels pending today.

## 2023-02-17 NOTE — Assessment & Plan Note (Signed)
Immunizations UTD. Influenza vaccine provided today. Pap smear UTD. Mammogram due, she is aware and will schedule.  Discussed the importance of a healthy diet and regular exercise in order for weight loss, and to reduce the risk of further co-morbidity.  Exam stable. Labs pending.  Follow up in 1 year for repeat physical.

## 2023-02-17 NOTE — Assessment & Plan Note (Signed)
Labs pending today including thyroid studies, CBC, vitamin B12 and vitamin D.

## 2023-02-17 NOTE — Assessment & Plan Note (Signed)
A1c pending today.  History of gestational diabetes.

## 2023-02-17 NOTE — Progress Notes (Signed)
Subjective:    Patient ID: Deborah Mccarthy, female    DOB: [birth year], 42 y.o.   MRN: [id number]  HPI  Deborah Mccarthy is a very pleasant 42 y.o. female who presents today to re-establish care, for complete physical and follow up of chronic conditions.  She would also like to discuss frustration with lack of weight loss. Also with fatigue, low energy levels, brain fog, irregular menstrual cycles, facial acne. She questions if there is something wrong metabolically. She would also like to try weight loss medication, particularly the GLP-1 agonist treatment or metformin. She has struggled with her weight for years despite changes in her lifestyle.   Immunizations: -Tetanus: Completed in 2023 -Influenza: Influenza vaccine provided today.   Exercise: No regular exercise.  Diet currently consists of:  Breakfast: Skips, [place] bacon and yogurt Lunch: Cottage cheese, yogurt Dinner: Protein, starch, veggie. 50% take out food and 50 % home cooked meals.  Snacks: Infrequently  Desserts: Several times weekly Beverages: Coke Zero, water, coffee,    Eye exam: Completes annually  Dental exam: Completes semi-annually    Pap Smear: Completed in 2024 Mammogram: Completed last in 2022   Wt Readings from Last 3 Encounters:  02/17/23 185 lb (83.9 kg)  10/12/22 181 lb (82.1 kg)  02/01/22 184 lb (83.5 kg)      Review of Systems  Constitutional:  Positive for fatigue. Negative for unexpected weight change.  HENT:  Negative for rhinorrhea.   Respiratory:  Negative for cough and shortness of breath.   Cardiovascular:  Negative for chest pain.  Gastrointestinal:  Negative for constipation and diarrhea.  Genitourinary:  Negative for difficulty urinating.  Musculoskeletal:  Negative for arthralgias and myalgias.  Skin:  Negative for rash.  Allergic/Immunologic: Negative for environmental allergies.  Neurological:  Negative for dizziness and headaches.  Psychiatric/Behavioral:  Positive for  sleep disturbance. The patient is not nervous/anxious.          Past Medical History:  Diagnosis Date   Abnormal Pap smear of vagina    Advanced maternal age in multigravida 11/14/2020   BV (bacterial vaginosis)    Cervical dysplasia    Chlamydia    Depression    Dysmenorrhea    Gestational diabetes    Hives    Menorrhagia    Ovarian cyst    Rectal ulcer    Negative for Crohn's   Vitamin D deficiency     Social History   Socioeconomic History   Marital status: Married    Spouse name: Not on file   Number of children: 3   Years of education: Not on file   Highest education level: Bachelor's degree (e.g., BA, AB, BS)  Occupational History   Not on file  Tobacco Use   Smoking status: Former    Current packs/day: 0.00    Average packs/day: 1 pack/day for 15.0 years (15.0 ttl pk-yrs)    Types: Cigarettes    Start date: 10/22/2005    Quit date: 10/22/2020    Years since quitting: 2.3   Smokeless tobacco: Never  Vaping Use   Vaping status: Never Used  Substance and Sexual Activity   Alcohol use: No    Alcohol/week: 0.0 standard drinks of alcohol   Drug use: No   Sexual activity: Not Currently    Birth control/protection: Surgical  Other Topics Concern   Not on file  Social History Narrative   Married.   3 children.   Works as a Child psychotherapist.   Enjoys reading, gardening, relaxing.  Social Determinants of Health   Financial Resource Strain: Low Risk  (02/17/2023)   Overall Financial Resource Strain (CARDIA)    Difficulty of Paying Living Expenses: Not very hard  Food Insecurity: No Food Insecurity (02/17/2023)   Hunger Vital Sign    Worried About Running Out of Food in the Last Year: Never true    Ran Out of Food in the Last Year: Never true  Transportation Needs: No Transportation Needs (02/17/2023)   PRAPARE - Administrator, Civil Service (Medical): No    Lack of Transportation (Non-Medical): No  Physical Activity: Insufficiently Active  (02/17/2023)   Exercise Vital Sign    Days of Exercise per Week: 1 day    Minutes of Exercise per Session: 30 min  Stress: Stress Concern Present (02/17/2023)   [employer] of Occupational Health - Occupational Stress Questionnaire    Feeling of Stress : To some extent  Social Connections: Socially Integrated (02/17/2023)   Social Connection and Isolation Panel [NHANES]    Frequency of Communication with Friends and Family: Three times a week    Frequency of Social Gatherings with Friends and Family: Once a week    Attends Religious Services: More than 4 times per year    Active Member of [employer] or Organizations: Yes    Attends Engineer, structural: More than 4 times per year    Marital Status: Married  Catering manager Violence: Not on file    Past Surgical History:  Procedure Laterality Date   CRYOTHERAPY     FINGER SURGERY     PILONIDAL CYST EXCISION     WISDOM TOOTH EXTRACTION     XI ROBOTIC ASSISTED SALPINGECTOMY Bilateral 08/11/2021   Procedure: XI ROBOTIC ASSISTED SALPINGECTOMY;  Surgeon: Natale Milch, MD;  Location: [facility] ORS;  Service: Gynecology;  Laterality: Bilateral;    Family History  Problem Relation Age of Onset   Graves' disease Mother    Coronary artery disease Father    Diabetes Father    Breast cancer Maternal Grandmother 50   Coronary artery disease Maternal Grandmother    Colon cancer Maternal Grandfather    Colon cancer Other     No Known Allergies  Current Outpatient Medications on File Prior to Visit  Medication Sig Dispense Refill   ACCU-CHEK GUIDE test strip USE AS INSTRUCTED FOUR TIMES A DAY     Blood Glucose Monitoring Suppl (ACCU-CHEK GUIDE) w/Device KIT 4 (four) times daily. use as directed     Niacinamide POWD Apply 1 Application topically at bedtime. Qhs to dark spots on face for up to 3 months 30 g 2   No current facility-administered medications on file prior to visit.    BP 124/66   Pulse 75   Temp 98.2 F (36.8  C) (Temporal)   Ht 5\' 1"  (1.549 m)   Wt 185 lb (83.9 kg)   LMP 02/01/2023   SpO2 98%   BMI 34.96 kg/m  Objective:   Physical Exam HENT:     Right Ear: Tympanic membrane and ear canal normal.     Left Ear: Tympanic membrane and ear canal normal.     Nose: Nose normal.  Eyes:     Conjunctiva/sclera: Conjunctivae normal.     Pupils: Pupils are equal, round, and reactive to light.  Neck:     Thyroid: No thyromegaly.  Cardiovascular:     Rate and Rhythm: Normal rate and regular rhythm.     Heart sounds: No murmur heard. Pulmonary:  Effort: Pulmonary effort is normal.     Breath sounds: Normal breath sounds. No rales.  Abdominal:     General: Bowel sounds are normal.     Palpations: Abdomen is soft.     Tenderness: There is no abdominal tenderness.  Musculoskeletal:        General: Normal range of motion.     Cervical back: Neck supple.  Lymphadenopathy:     Cervical: No cervical adenopathy.  Skin:    General: Skin is warm and dry.     Findings: No rash.  Neurological:     Mental Status: She is alert and oriented to person, place, and time.     Cranial Nerves: No cranial nerve deficit.     Deep Tendon Reflexes: Reflexes are normal and symmetric.  Psychiatric:        Mood and Affect: Mood normal.           Assessment & Plan:  Encounter for annual general medical examination with abnormal findings in adult Assessment & Plan: Immunizations UTD. Influenza vaccine provided today. Pap smear UTD. Mammogram due, she is aware and will schedule.  Discussed the importance of a healthy diet and regular exercise in order for weight loss, and to reduce the risk of further co-morbidity.  Exam stable. Labs pending.  Follow up in 1 year for repeat physical.    Family history of Graves' disease Assessment & Plan: Thyroid levels pending today.  Orders: -     TSH  Hyperglycemia Assessment & Plan: A1c pending today.  History of gestational diabetes.  Orders: -      Lipid panel -     Hemoglobin A1c  Class 1 obesity due to excess calories without serious comorbidity with body mass index (BMI) of 34.0 to 34.9 in adult Assessment & Plan: Time spent today discussing her current diet and exercise regimen.  Discussed options including referral to healthy weight and wellness center.  She is prefers to start GLP-1 agonist treatment.  Discussed potential side effects, instructions for administration and dosing.  Start semaglutide [facility]) forweight loss. Start by injecting 0.25 mg into the skin once weekly for 4 weeks, then increase to 0.5 mg once weekly thereafter.   Follow up in 3 months.  Orders: -     Semaglutide-Weight Management; Inject 0.25 mg into the skin once weekly for 4 weeks, then increase to 0.5 mg once weekly thereafter.  Dispense: 2 mL; Refill: 0  Chronic fatigue Assessment & Plan: Labs pending today including thyroid studies, CBC, vitamin B12 and vitamin D.  Orders: -     Vitamin B12 -     VITAMIN D 25 Hydroxy (Vit-D Deficiency, Fractures) -     Comprehensive metabolic panel -     CBC        Doreene Nest, NP

## 2023-02-17 NOTE — Patient Instructions (Signed)
Stop by the lab prior to leaving today. I will notify you of your results once received.   Schedule your mammogram.  Start semaglutide [facility]) for diabetes/weight loss. Start by injecting 0.25 mg into the skin once weekly for 4 weeks, then increase to 0.5 mg once weekly thereafter.   Schedule a 74-month follow-up visit once you start your [facility] medicine.  It was a pleasure to see you today!

## 2023-02-18 ENCOUNTER — Ambulatory Visit: Payer: BC Managed Care – PPO

## 2023-02-18 DIAGNOSIS — D72829 Elevated white blood cell count, unspecified: Secondary | ICD-10-CM

## 2023-02-18 DIAGNOSIS — D721 Eosinophilia, unspecified: Secondary | ICD-10-CM | POA: Diagnosis not present

## 2023-02-18 DIAGNOSIS — D72820 Lymphocytosis (symptomatic): Secondary | ICD-10-CM | POA: Diagnosis not present

## 2023-02-21 LAB — PATHOLOGIST SMEAR REVIEW

## 2023-02-22 ENCOUNTER — Ambulatory Visit (INDEPENDENT_AMBULATORY_CARE_PROVIDER_SITE_OTHER): Payer: BC Managed Care – PPO | Admitting: Podiatry

## 2023-02-22 DIAGNOSIS — M778 Other enthesopathies, not elsewhere classified: Secondary | ICD-10-CM | POA: Diagnosis not present

## 2023-02-22 DIAGNOSIS — Q828 Other specified congenital malformations of skin: Secondary | ICD-10-CM | POA: Diagnosis not present

## 2023-02-22 NOTE — Progress Notes (Signed)
  Subjective:  Patient ID: Deborah Mccarthy, female    DOB: [birth year],  MRN: [id number]  Chief Complaint  Patient presents with   Callouses    42 y.o. female presents with the above complaint.  Patient presents with left midfoot porokeratotic lesion with underlying capsulitis pain for touch is progressive and worse hurts with ambulation or shoe pressure.  S she states it came back and is causing her more issues.  She would like to do the same thing as last time.   Review of Systems: Negative except as noted in the HPI. Denies N/V/F/Ch.  Past Medical History:  Diagnosis Date   Abnormal Pap smear of vagina    Advanced maternal age in multigravida 11/14/2020   BV (bacterial vaginosis)    Cervical dysplasia    Chlamydia    Depression    Dysmenorrhea    Gestational diabetes    Hives    Menorrhagia    Ovarian cyst    Rectal ulcer    Negative for Crohn's   Vitamin D deficiency     Current Outpatient Medications:    ACCU-CHEK GUIDE test strip, USE AS INSTRUCTED FOUR TIMES A DAY, Disp: , Rfl:    Blood Glucose Monitoring Suppl (ACCU-CHEK GUIDE) w/Device KIT, 4 (four) times daily. use as directed, Disp: , Rfl:    Niacinamide POWD, Apply 1 Application topically at bedtime. Qhs to dark spots on face for up to 3 months, Disp: 30 g, Rfl: 2   Semaglutide-Weight Management 0.25 MG/0.5ML SOAJ, Inject 0.25 mg into the skin once weekly for 4 weeks, then increase to 0.5 mg once weekly thereafter., Disp: 2 mL, Rfl: 0  Social History   Tobacco Use  Smoking Status Former   Current packs/day: 0.00   Average packs/day: 1 pack/day for 15.0 years (15.0 ttl pk-yrs)   Types: Cigarettes   Start date: 10/22/2005   Quit date: 10/22/2020   Years since quitting: 2.3  Smokeless Tobacco Never    No Known Allergies Objective:  There were no vitals filed for this visit. There is no height or weight on file to calculate BMI. Constitutional Well developed. Well nourished.  Vascular Dorsalis pedis pulses  palpable bilaterally. Posterior tibial pulses palpable bilaterally. Capillary refill normal to all digits.  No cyanosis or clubbing noted. Pedal hair growth normal.  Neurologic Normal speech. Oriented to person, place, and time. Epicritic sensation to light touch grossly present bilaterally.  Dermatologic Nails well groomed and normal in appearance. No open wounds. No skin lesions.  Orthopedic: Pain on palpation left midfoot porokeratotic lesion noted central nucleated core noted no pinpoint bleeding noted upon debridement   Radiographs: None Assessment:   1. Porokeratosis   2. Capsulitis of left foot     Plan:  Patient was evaluated and treated and all questions answered.  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  Patient agrees with plan like to proceed with steroid injection -A second steroid injection was performed at left midfoot using 1% plain Lidocaine and 10 mg of Kenalog. This was well tolerated. -I discussed shoe gear modification as well as orthotics management in the future. -Also discussed surgical options with her as well   No follow-ups on file.

## 2023-02-23 ENCOUNTER — Other Ambulatory Visit: Payer: Self-pay | Admitting: Primary Care

## 2023-02-23 DIAGNOSIS — D72829 Elevated white blood cell count, unspecified: Secondary | ICD-10-CM

## 2023-02-25 ENCOUNTER — Inpatient Hospital Stay: Payer: BC Managed Care – PPO

## 2023-02-25 ENCOUNTER — Encounter: Payer: Self-pay | Admitting: Oncology

## 2023-02-25 ENCOUNTER — Inpatient Hospital Stay: Payer: BC Managed Care – PPO | Attending: Oncology | Admitting: Oncology

## 2023-02-25 VITALS — BP 120/84 | HR 64 | Temp 97.9°F | Resp 18 | Wt 184.6 lb

## 2023-02-25 DIAGNOSIS — Z803 Family history of malignant neoplasm of breast: Secondary | ICD-10-CM | POA: Insufficient documentation

## 2023-02-25 DIAGNOSIS — D72829 Elevated white blood cell count, unspecified: Secondary | ICD-10-CM

## 2023-02-25 DIAGNOSIS — F17290 Nicotine dependence, other tobacco product, uncomplicated: Secondary | ICD-10-CM | POA: Insufficient documentation

## 2023-02-25 DIAGNOSIS — Z800 Family history of malignant neoplasm of digestive organs: Secondary | ICD-10-CM | POA: Diagnosis not present

## 2023-02-25 LAB — CBC WITH DIFFERENTIAL/PLATELET
Abs Immature Granulocytes: 0.07 10*3/uL (ref 0.00–0.07)
Basophils Absolute: 0.1 10*3/uL (ref 0.0–0.1)
Basophils Relative: 1 %
Eosinophils Absolute: 0.3 10*3/uL (ref 0.0–0.5)
Eosinophils Relative: 3 %
HCT: 40.2 % (ref 36.0–46.0)
Hemoglobin: 13.2 g/dL (ref 12.0–15.0)
Immature Granulocytes: 1 %
Lymphocytes Relative: 30 %
Lymphs Abs: 3.4 10*3/uL (ref 0.7–4.0)
MCH: 29.1 pg (ref 26.0–34.0)
MCHC: 32.8 g/dL (ref 30.0–36.0)
MCV: 88.7 fL (ref 80.0–100.0)
Monocytes Absolute: 0.8 10*3/uL (ref 0.1–1.0)
Monocytes Relative: 7 %
Neutro Abs: 6.7 10*3/uL (ref 1.7–7.7)
Neutrophils Relative %: 58 %
Platelets: 335 10*3/uL (ref 150–400)
RBC: 4.53 MIL/uL (ref 3.87–5.11)
RDW: 13.2 % (ref 11.5–15.5)
WBC: 11.4 10*3/uL — ABNORMAL HIGH (ref 4.0–10.5)
nRBC: 0 % (ref 0.0–0.2)

## 2023-02-25 LAB — TECHNOLOGIST SMEAR REVIEW: Plt Morphology: ADEQUATE

## 2023-02-25 LAB — LACTATE DEHYDROGENASE: LDH: 113 U/L (ref 98–192)

## 2023-02-25 LAB — HEPATITIS PANEL, ACUTE
HCV Ab: NONREACTIVE
Hep A IgM: NONREACTIVE
Hep B C IgM: NONREACTIVE
Hepatitis B Surface Ag: NONREACTIVE

## 2023-02-25 LAB — HIV ANTIBODY (ROUTINE TESTING W REFLEX): HIV Screen 4th Generation wRfx: NONREACTIVE

## 2023-02-25 NOTE — Assessment & Plan Note (Signed)
Leukocytosis, differential diagnosis is broad, acute or chronic infection and inflammation, smoking, autoimmune disease, or underlying bone marrow disorders.   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 For the work up of patient's leukocytosis, I recommend checking CBC; LDH, smear review, peripheral flowcytometry, hepatitis, HIV, SPEP

## 2023-02-25 NOTE — Progress Notes (Signed)
Hematology/Oncology Consult Note [phone] [phone]     REFERRING PROVIDER: Doreene Nest, NP   CHIEF COMPLAINTS/REASON FOR VISIT:  Evaluation of leukocytosis  ASSESSMENT & PLAN:   Leukocytosis Leukocytosis, differential diagnosis is broad, acute or chronic infection and inflammation, smoking, autoimmune disease, or underlying bone marrow disorders.   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 For the work up of patient's leukocytosis, I recommend checking CBC; LDH, smear review, peripheral flowcytometry, hepatitis, HIV, SPEP   Orders Placed This Encounter  Procedures   CBC with Differential/Platelet    Standing Status:   Future    Number of Occurrences:   1    Standing Expiration Date:   02/25/2024   Protein electrophoresis, serum    Standing Status:   Future    Number of Occurrences:   1    Standing Expiration Date:   02/25/2024   Flow cytometry panel-leukemia/lymphoma work-up    Standing Status:   Future    Number of Occurrences:   1    Standing Expiration Date:   02/25/2024   Lactate dehydrogenase    Standing Status:   Future    Number of Occurrences:   1    Standing Expiration Date:   02/25/2024   Hepatitis panel, acute    Standing Status:   Future    Number of Occurrences:   1    Standing Expiration Date:   02/25/2024   HIV Antibody (routine testing w rflx)    Standing Status:   Future    Number of Occurrences:   1    Standing Expiration Date:   02/25/2024   Technologist smear review    Standing Status:   Future    Number of Occurrences:   1    Standing Expiration Date:   02/25/2024    Order Specific Question:   Clinical information:    Answer:   leukocytosis    Return of visit: 3-4 weeks to discuss results.  Cc Doreene Nest, NP All questions were answered. The patient knows to call the clinic with any problems, questions or concerns.  Rickard Patience, MD, PhD [facility] Hematology Oncology 02/25/2023    HISTORY  OF PRESENTING ILLNESS:  Deborah Mccarthy is a  42 y.o.  female with PMH listed below who was referred to me for evaluation of leukocytosis  Previous lab records reviewed. Leukocytosis onset of chronic, duration is since 2015  Patient denies unintentional weight loss, fever, chills,night sweats.   Patient smokes cigar on some days. Denies history of recent oral steroid use or steroid injection:  Denies recent infection, denies sinus congestion, cough, urinary frequency/urgency or dysuria, diarrhea, joint swelling/pain or abnormal skin rash, prosthetics. Denies autoimmune disease history.      MEDICAL HISTORY:  Past Medical History:  Diagnosis Date   Abnormal Pap smear of vagina    Advanced maternal age in multigravida 11/14/2020   BV (bacterial vaginosis)    Cervical dysplasia    Chlamydia    Depression    Dysmenorrhea    Gestational diabetes    Hives    Menorrhagia    Ovarian cyst    Rectal ulcer    Negative for Crohn's   Vitamin D deficiency     SURGICAL HISTORY: Past Surgical History:  Procedure Laterality Date   CRYOTHERAPY     FINGER SURGERY     PILONIDAL CYST EXCISION     WISDOM TOOTH EXTRACTION     XI ROBOTIC ASSISTED SALPINGECTOMY Bilateral 08/11/2021   Procedure:  XI ROBOTIC ASSISTED SALPINGECTOMY;  Surgeon: Natale Milch, MD;  Location: [facility] ORS;  Service: Gynecology;  Laterality: Bilateral;    SOCIAL HISTORY: Social History   Socioeconomic History   Marital status: Married    Spouse name: Not on file   Number of children: 3   Years of education: Not on file   Highest education level: Bachelor's degree (e.g., BA, AB, BS)  Occupational History   Not on file  Tobacco Use   Smoking status: Some Days    Types: Cigars   Smokeless tobacco: Never  Vaping Use   Vaping status: Never Used  Substance and Sexual Activity   Alcohol use: No    Alcohol/week: 0.0 standard drinks of alcohol   Drug use: No   Sexual activity: Not Currently    Birth  control/protection: Surgical  Other Topics Concern   Not on file  Social History Narrative   Married.   3 children.   Works as a Child psychotherapist.   Enjoys reading, gardening, relaxing.    Social Determinants of Health   Financial Resource Strain: Low Risk  (02/17/2023)   Overall Financial Resource Strain (CARDIA)    Difficulty of Paying Living Expenses: Not very hard  Food Insecurity: No Food Insecurity (02/17/2023)   Hunger Vital Sign    Worried About Running Out of Food in the Last Year: Never true    Ran Out of Food in the Last Year: Never true  Transportation Needs: No Transportation Needs (02/17/2023)   PRAPARE - Administrator, Civil Service (Medical): No    Lack of Transportation (Non-Medical): No  Physical Activity: Insufficiently Active (02/17/2023)   Exercise Vital Sign    Days of Exercise per Week: 1 day    Minutes of Exercise per Session: 30 min  Stress: Stress Concern Present (02/17/2023)   [employer] of Occupational Health - Occupational Stress Questionnaire    Feeling of Stress : To some extent  Social Connections: Socially Integrated (02/17/2023)   Social Connection and Isolation Panel [NHANES]    Frequency of Communication with Friends and Family: Three times a week    Frequency of Social Gatherings with Friends and Family: Once a week    Attends Religious Services: More than 4 times per year    Active Member of [employer] or Organizations: Yes    Attends Engineer, structural: More than 4 times per year    Marital Status: Married  Catering manager Violence: Not on file    FAMILY HISTORY: Family History  Problem Relation Age of Onset   Graves' disease Mother    Coronary artery disease Father    Diabetes Father    Breast cancer Maternal Grandmother 50   Coronary artery disease Maternal Grandmother    Colon cancer Maternal Grandfather    Colon cancer Other     ALLERGIES:  has No Known Allergies.  MEDICATIONS:  Current Outpatient  Medications  Medication Sig Dispense Refill   ACCU-CHEK GUIDE test strip USE AS INSTRUCTED FOUR TIMES A DAY     Blood Glucose Monitoring Suppl (ACCU-CHEK GUIDE) w/Device KIT 4 (four) times daily. use as directed     Cholecalciferol (VITAMIN D3) 50 MCG (2000 UT) CAPS Take by mouth.     Multiple Vitamin (MULTIVITAMIN WITH MINERALS) TABS tablet Take 1 tablet by mouth daily.     vitamin B-12 (CYANOCOBALAMIN) 500 MCG tablet Take 500 mcg by mouth daily.     Niacinamide POWD Apply 1 Application topically at bedtime. Qhs to  dark spots on face for up to 3 months (Patient not taking: Reported on 02/25/2023) 30 g 2   Semaglutide-Weight Management 0.25 MG/0.5ML SOAJ Inject 0.25 mg into the skin once weekly for 4 weeks, then increase to 0.5 mg once weekly thereafter. (Patient not taking: Reported on 02/25/2023) 2 mL 0   No current facility-administered medications for this visit.    Review of Systems  Constitutional:  Negative for appetite change, chills, fatigue and fever.  HENT:   Negative for hearing loss and voice change.   Eyes:  Negative for eye problems.  Respiratory:  Negative for chest tightness and cough.   Cardiovascular:  Negative for chest pain.  Gastrointestinal:  Negative for abdominal distention, abdominal pain and blood in stool.  Endocrine: Negative for hot flashes.  Genitourinary:  Negative for difficulty urinating and frequency.   Musculoskeletal:  Negative for arthralgias.  Skin:  Negative for itching and rash.  Neurological:  Negative for extremity weakness.  Hematological:  Negative for adenopathy.  Psychiatric/Behavioral:  Negative for confusion.     PHYSICAL EXAMINATION: ECOG PERFORMANCE STATUS: 0 - Asymptomatic Vitals:   02/25/23 0917  BP: 120/84  Pulse: 64  Resp: 18  Temp: 97.9 F (36.6 C)  SpO2: 99%   Filed Weights   02/25/23 0917  Weight: 184 lb 9.6 oz (83.7 kg)    Physical Exam Constitutional:      General: She is not in acute distress. HENT:     Head:  Normocephalic and atraumatic.  Eyes:     General: No scleral icterus. Cardiovascular:     Rate and Rhythm: Normal rate and regular rhythm.     Heart sounds: Normal heart sounds.  Pulmonary:     Effort: Pulmonary effort is normal. No respiratory distress.     Breath sounds: No wheezing.  Abdominal:     General: Bowel sounds are normal. There is no distension.     Palpations: Abdomen is soft.  Musculoskeletal:        General: No deformity. Normal range of motion.     Cervical back: Normal range of motion and neck supple.  Skin:    General: Skin is warm and dry.     Findings: No erythema or rash.  Neurological:     Mental Status: She is alert and oriented to person, place, and time. Mental status is at baseline.     Cranial Nerves: No cranial nerve deficit.     Coordination: Coordination normal.  Psychiatric:        Mood and Affect: Mood normal.        RADIOGRAPHIC STUDIES: I have personally reviewed the radiological images as listed and agreed with the findings in the report. No results found.  LABORATORY DATA:  I have reviewed the data as listed    Latest Ref Rng & Units 02/25/2023   10:04 AM 02/17/2023   12:33 PM 07/07/2021    5:44 AM  CBC  WBC 4.0 - 10.5 K/uL 11.4  10.6  15.6   Hemoglobin 12.0 - 15.0 g/dL 16.1  09.6  04.5   Hematocrit 36.0 - 46.0 % 40.2  43.3  32.2   Platelets 150 - 400 K/uL 335  388.0  298       Latest Ref Rng & Units 02/17/2023   12:33 PM 08/10/2021    3:53 PM 01/15/2020    9:50 AM  CMP  Glucose 70 - 99 mg/dL 85  409  96   BUN 6 - 23 mg/dL 6  8  Creatinine 0.40 - 1.20 mg/dL 1.61  0.96    Sodium 045 - 145 mEq/L 138  139    Potassium 3.5 - 5.1 mEq/L 4.9  3.8    Chloride 96 - 112 mEq/L 107  105    CO2 19 - 32 mEq/L 25  27    Calcium 8.4 - 10.5 mg/dL 9.6  9.2    Total Protein 6.0 - 8.3 g/dL 7.0     Total Bilirubin 0.2 - 1.2 mg/dL 0.3     Alkaline Phos 39 - 117 U/L 58     AST 0 - 37 U/L 10     ALT 0 - 35 U/L 11

## 2023-03-01 NOTE — Telephone Encounter (Signed)
[facility], can you check on the status of her [employer] PA?

## 2023-03-02 ENCOUNTER — Other Ambulatory Visit (HOSPITAL_COMMUNITY): Payer: Self-pay

## 2023-03-02 LAB — PROTEIN ELECTROPHORESIS, SERUM
A/G Ratio: 1.3 (ref 0.7–1.7)
Albumin ELP: 3.6 g/dL (ref 2.9–4.4)
Alpha-1-Globulin: 0.2 g/dL (ref 0.0–0.4)
Alpha-2-Globulin: 0.7 g/dL (ref 0.4–1.0)
Beta Globulin: 1 g/dL (ref 0.7–1.3)
Gamma Globulin: 0.9 g/dL (ref 0.4–1.8)
Globulin, Total: 2.8 g/dL (ref 2.2–3.9)
Total Protein ELP: 6.4 g/dL (ref 6.0–8.5)

## 2023-03-03 ENCOUNTER — Telehealth: Payer: Self-pay

## 2023-03-03 LAB — COMP PANEL: LEUKEMIA/LYMPHOMA

## 2023-03-03 NOTE — Telephone Encounter (Signed)
PA request has been Submitted. New Encounter created for follow up. For additional info see Pharmacy Prior Auth telephone encounter from 03/03/2023.

## 2023-03-03 NOTE — Telephone Encounter (Signed)
Pharmacy Patient Advocate Encounter   Received notification from CoverMyMeds that prior authorization for [facility] 0.25MG /0.5ML auto-injectors is required/requested.   Insurance verification completed.   The patient is insured through [facility] .   Per test claim: PA required; PA submitted to [employer] via CoverMyMeds Key/confirmation #/EOC [id number] Status is pending

## 2023-03-08 NOTE — Telephone Encounter (Signed)
Pharmacy Patient Advocate Encounter  Received notification from [facility] that Prior Authorization for Reginal Lutes has been APPROVED from 03/03/2023 to 07/07/2023   PA #/Case ID/Reference #: [id number]

## 2023-03-18 NOTE — Assessment & Plan Note (Addendum)
Labs reviewed and discussed with patient. She has mild leukocytosis with completely normal differential subgroups. Negative peripheral blood flow cytometry, negative M protein on protein after pheresis.  Normal LDH, negative HIV, negative hepatitis panel. Likely reactive process secondary to smoking cigar, chronic inflammation.  She has no constitutional symptoms I recommend observation.

## 2023-03-24 IMAGING — US US MFM OB FOLLOW-UP
1 series · 14 of 28 positions shown · non-contrast
Comparison: none

[Series 1: us mfm ob follow-up · 40 acquisitions, 14 frames shown]
[im 2/40]
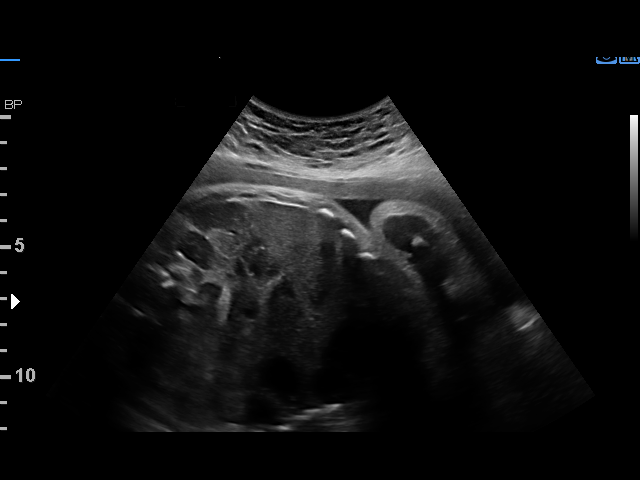
[im 5/40]
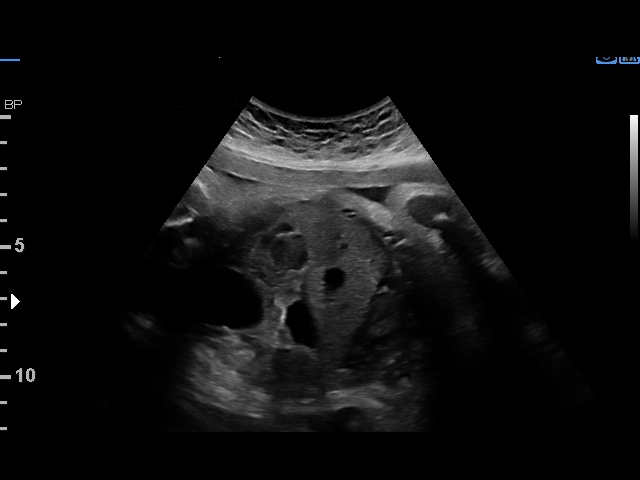
[im 8/40]
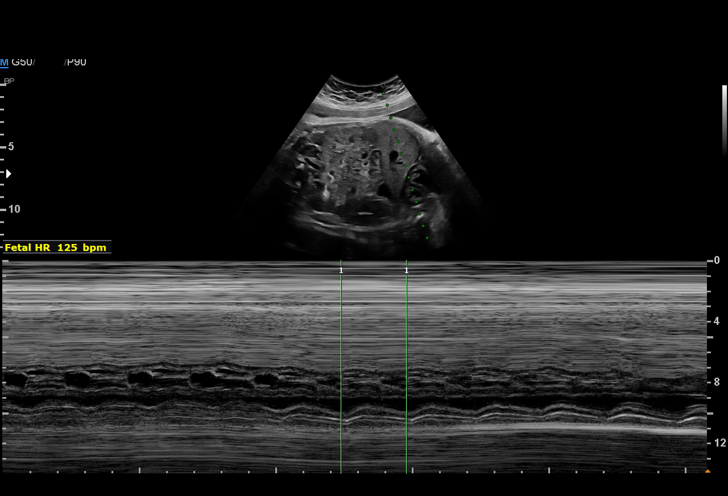
[im 11/40]
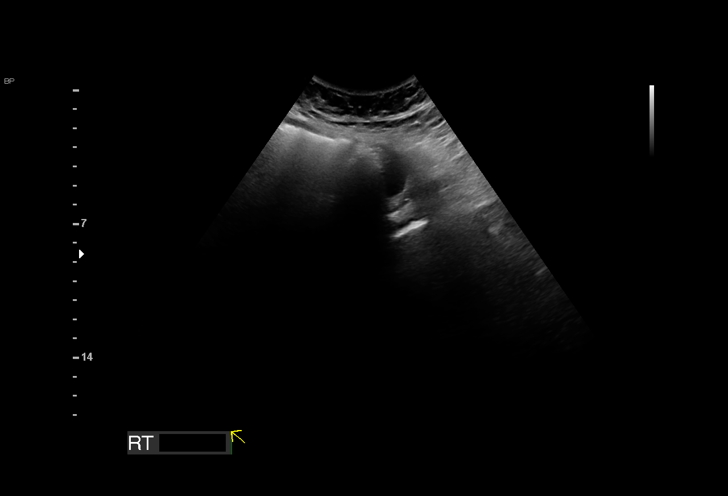
[im 14/40]
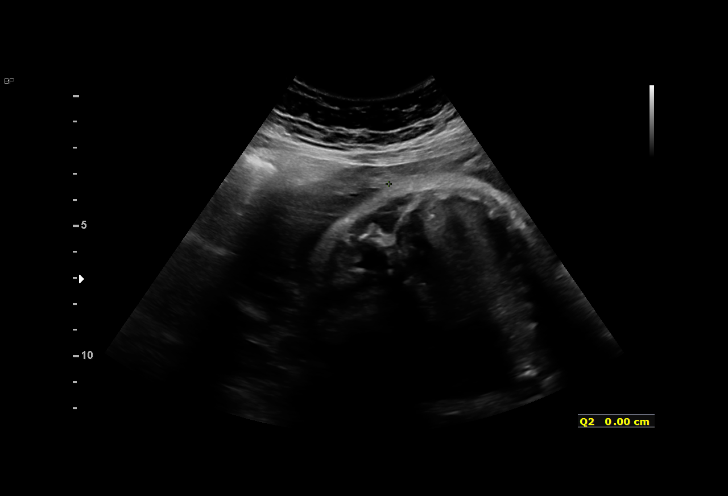
[im 16/40]
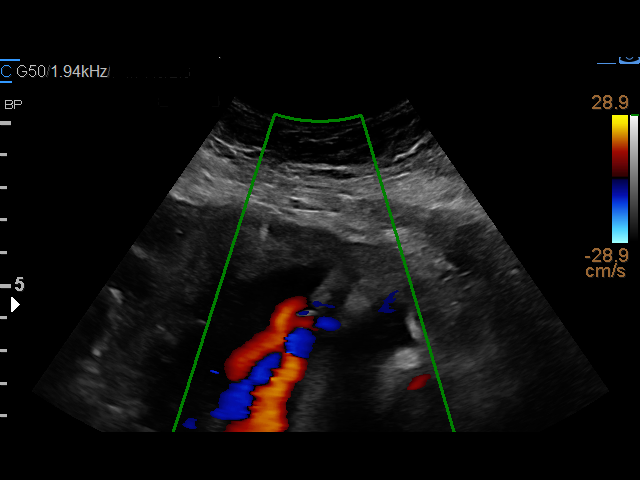
[im 19/40]
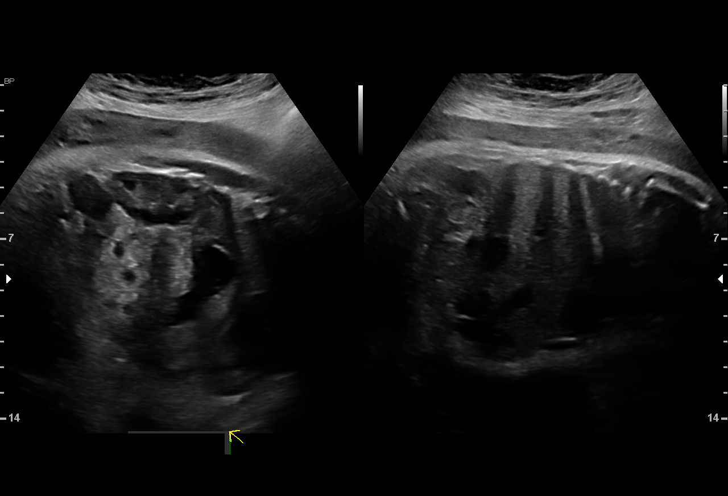
[im 22/40]
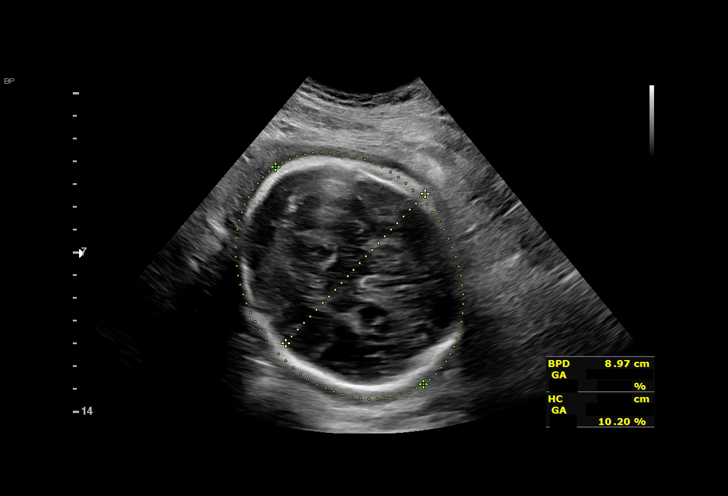
[im 25/40]
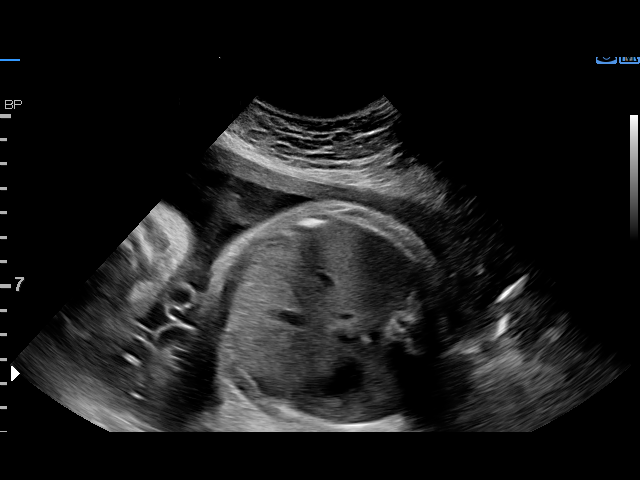
[im 28/40]
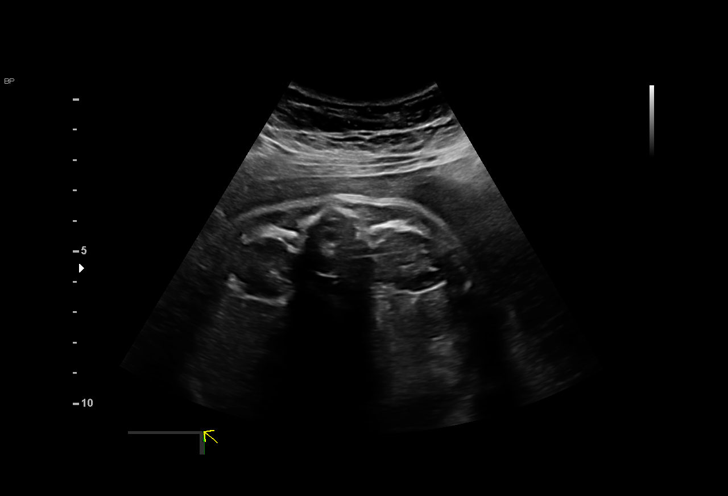
[im 31/40]
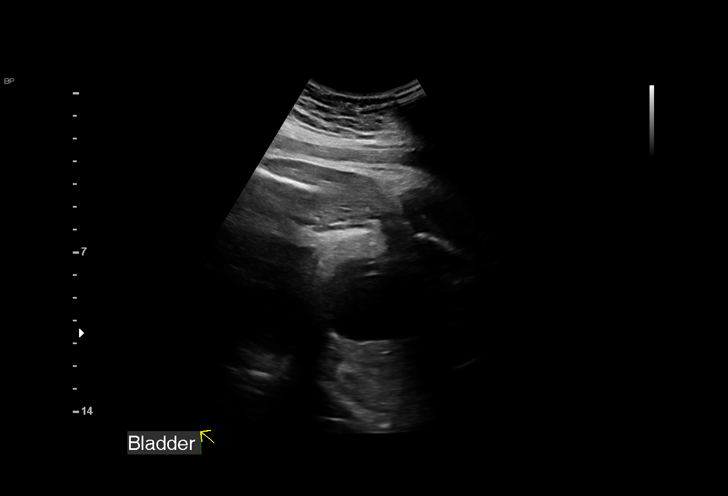
[im 34/40]
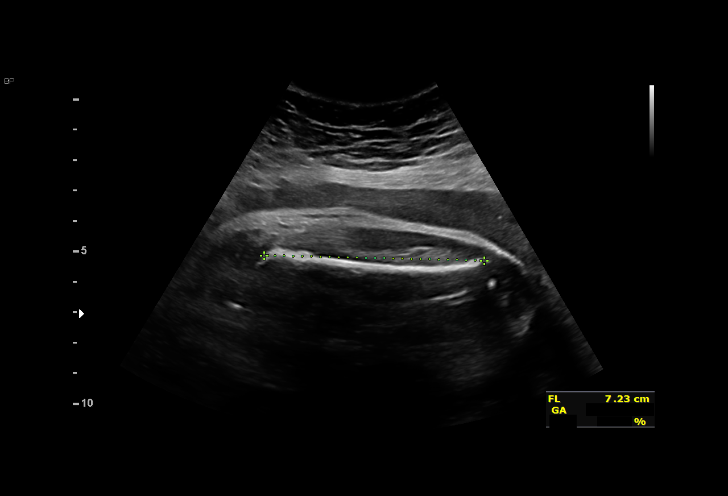
[im 37/40]
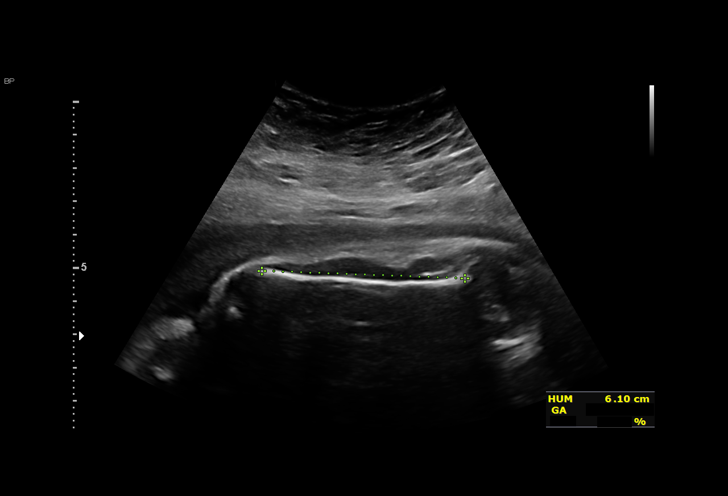
[im 40/40]
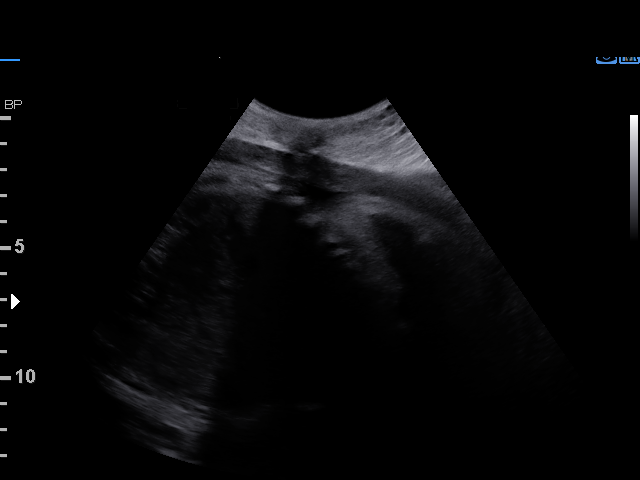

[14 of 28 positions shown; findings below may reference images not displayed]

Indications

 Gestational diabetes in pregnancy,
 controlled by oral hypoglycemic drugs
 (metformin)
 Advanced maternal age multigravida 35+,
 third trimester (40 yo)
 Obesity complicating pregnancy, third
 trimester (BMI pre-G 33.65)
 Negative 3aterni2YE
 38 weeks gestation of pregnancy
Fetal Evaluation

 Num Of Fetuses:         1
 Fetal Heart Rate(bpm):  125
 Cardiac Activity:       Observed
 Presentation:           Cephalic
 Placenta:               Right lateral
 P. Cord Insertion:      Previously Visualized

 Amniotic Fluid
 AFI FV:      Within normal limits

 AFI Sum(cm)     %Tile       Largest Pocket(cm)
 10.76           33

 RUQ(cm)       RLQ(cm)       LUQ(cm)        LLQ(cm)
 6.75          1.64          0
Biophysical Evaluation
 Amniotic F.V:   Pocket => 2 cm             F. Tone:        Observed
 F. Movement:    Observed                   Score:          [DATE]
 F. Breathing:   Observed
Biometry

 BPD:      89.7  mm     G. Age:  36w 2d         21  %    CI:        75.44   %    70 - 86
                                                         FL/HC:      22.2   %    20.9 -
 HC:      327.5  mm     G. Age:  37w 1d         10  %    HC/AC:      1.01        0.92 -
 AC:       325   mm     G. Age:  36w 3d         16  %    FL/BPD:     81.0   %    71 - 87
 FL:       72.7  mm     G. Age:  37w 2d         24  %    FL/AC:      22.4   %    20 - 24
 HUM:      61.2  mm     G. Age:  35w 3d         20  %

 Est. FW:    6669  gm    6 lb 10 oz      23  %
OB History

 Gravidity:    5         Term:   2        Prem:   0        SAB:   1
 TOP:          1       Ectopic:  0        Living: 2
Gestational Age

 U/S Today:     36w 6d                                        EDD:   07/22/21
 Best:          38w 3d     Det. By:  Early Ultrasound         EDD:   07/11/21
                                     (11/28/20)
Anatomy

 Cranium:               Appears normal         Aortic Arch:            Previously seen
 Cavum:                 Previously seen        Ductal Arch:            Previously seen
 Ventricles:            Previously seen        Diaphragm:              Appears normal
 Choroid Plexus:        Previously seen        Stomach:                Appears normal, left
                                                                       sided
 Cerebellum:            Previously seen        Abdomen:                Previously seen
 Posterior Fossa:       Previously seen        Abdominal Wall:         Previously seen
 Nuchal Fold:           Previously seen        Cord Vessels:           Previously seen
 Face:                  Orbits and profile     Kidneys:                Appear normal
                        previously seen
 Lips:                  Previously seen        Bladder:                Appears normal
 Thoracic:              Previously seen        Spine:                  Previously seen
 Heart:                 Previously seen        Upper Extremities:      Previously seen
 RVOT:                  Previously seen        Lower Extremities:      Previously seen
 LVOT:                  Previously seen

 Other:  Male gender previously seen.Technically difficult due to fetal position.
Cervix Uterus Adnexa

 Cervix
 Not visualized (advanced GA >60wks)

 Uterus
 Normal shape and size.

 Right Ovary
 Not visualized.
 Left Ovary
 Not visualized.
Comments

 This patient was seen for a BPP and growth scan due to
 gestational diabetes treated with metformin, maternal obesity,
 and advanced maternal age.  She denies any problems since
 her last exam and reports feeling fetal movements throughout
 the day.
 The fetal growth and amniotic fluid level appears appropriate
 for her gestational age.  The overall EFW was 6 pounds 10
 ounces, 23rd percentile.
 A biophysical profile performed today was [DATE].
 She already has an induction scheduled on July 06, 2021 (in 6 days).

 No further exams were scheduled in our office.

## 2023-03-25 ENCOUNTER — Encounter: Payer: Self-pay | Admitting: Oncology

## 2023-03-25 ENCOUNTER — Inpatient Hospital Stay: Payer: BC Managed Care – PPO | Attending: Oncology | Admitting: Oncology

## 2023-03-25 DIAGNOSIS — D72829 Elevated white blood cell count, unspecified: Secondary | ICD-10-CM | POA: Diagnosis not present

## 2023-03-25 NOTE — Progress Notes (Signed)
HEMATOLOGY-ONCOLOGY TeleHEALTH VISIT PROGRESS NOTE  I connected with Deborah Mccarthy on 03/25/23  at 10:00 AM EDT by video enabled telemedicine visit and verified that I am speaking with the correct person using two identifiers. I discussed the limitations, risks, security and privacy concerns of performing an evaluation and management service by telemedicine and the availability of in-person appointments. The patient expressed understanding and agreed to proceed.   Other persons participating in the visit and their role in the encounter:  None  Patient's location: Home  Provider's location: office Chief Complaint: Leukocytosis   INTERVAL HISTORY Deborah Mccarthy is a 42 y.o. female who has above history reviewed by me today presents for follow up visit for management of leukocytosis Patient presents virtually to discuss results.  She has no new complaints.  Denies any constitutional symptoms  Review of Systems  Constitutional:  Negative for appetite change, chills, fatigue and fever.  HENT:   Negative for hearing loss and voice change.   Eyes:  Negative for eye problems.  Respiratory:  Negative for chest tightness and cough.   Cardiovascular:  Negative for chest pain.  Gastrointestinal:  Negative for abdominal distention, abdominal pain and blood in stool.  Endocrine: Negative for hot flashes.  Genitourinary:  Negative for difficulty urinating and frequency.   Musculoskeletal:  Negative for arthralgias.  Skin:  Negative for itching and rash.  Neurological:  Negative for extremity weakness.  Hematological:  Negative for adenopathy.  Psychiatric/Behavioral:  Negative for confusion.     Past Medical History:  Diagnosis Date   Abnormal Pap smear of vagina    Advanced maternal age in multigravida 11/14/2020   BV (bacterial vaginosis)    Cervical dysplasia    Chlamydia    Depression    Dysmenorrhea    Gestational diabetes    Hives    Menorrhagia    Ovarian cyst    Rectal ulcer     Negative for Crohn's   Vitamin D deficiency    Past Surgical History:  Procedure Laterality Date   CRYOTHERAPY     FINGER SURGERY     PILONIDAL CYST EXCISION     WISDOM TOOTH EXTRACTION     XI ROBOTIC ASSISTED SALPINGECTOMY Bilateral 08/11/2021   Procedure: XI ROBOTIC ASSISTED SALPINGECTOMY;  Surgeon: Natale Milch, MD;  Location: [facility] ORS;  Service: Gynecology;  Laterality: Bilateral;    Family History  Problem Relation Age of Onset   Graves' disease Mother    Coronary artery disease Father    Diabetes Father    Breast cancer Maternal Grandmother 17   Coronary artery disease Maternal Grandmother    Colon cancer Maternal Grandfather    Colon cancer Other     Social History   Socioeconomic History   Marital status: Married    Spouse name: Not on file   Number of children: 3   Years of education: Not on file   Highest education level: Bachelor's degree (e.g., BA, AB, BS)  Occupational History   Not on file  Tobacco Use   Smoking status: Some Days    Types: Cigars   Smokeless tobacco: Never  Vaping Use   Vaping status: Never Used  Substance and Sexual Activity   Alcohol use: No    Alcohol/week: 0.0 standard drinks of alcohol   Drug use: No   Sexual activity: Not Currently    Birth control/protection: Surgical  Other Topics Concern   Not on file  Social History Narrative   Married.   3 children.  Works as a Child psychotherapist.   Enjoys reading, gardening, relaxing.    Social Determinants of Health   Financial Resource Strain: Low Risk  (02/17/2023)   Overall Financial Resource Strain (CARDIA)    Difficulty of Paying Living Expenses: Not very hard  Food Insecurity: No Food Insecurity (02/17/2023)   Hunger Vital Sign    Worried About Running Out of Food in the Last Year: Never true    Ran Out of Food in the Last Year: Never true  Transportation Needs: No Transportation Needs (02/17/2023)   PRAPARE - Administrator, Civil Service (Medical): No     Lack of Transportation (Non-Medical): No  Physical Activity: Insufficiently Active (02/17/2023)   Exercise Vital Sign    Days of Exercise per Week: 1 day    Minutes of Exercise per Session: 30 min  Stress: Stress Concern Present (02/17/2023)   [employer] of Occupational Health - Occupational Stress Questionnaire    Feeling of Stress : To some extent  Social Connections: Socially Integrated (02/17/2023)   Social Connection and Isolation Panel [NHANES]    Frequency of Communication with Friends and Family: Three times a week    Frequency of Social Gatherings with Friends and Family: Once a week    Attends Religious Services: More than 4 times per year    Active Member of [employer] or Organizations: Yes    Attends Engineer, structural: More than 4 times per year    Marital Status: Married  Catering manager Violence: Not on file    Current Outpatient Medications on File Prior to Visit  Medication Sig Dispense Refill   ACCU-CHEK GUIDE test strip USE AS INSTRUCTED FOUR TIMES A DAY     Blood Glucose Monitoring Suppl (ACCU-CHEK GUIDE) w/Device KIT 4 (four) times daily. use as directed     Cholecalciferol (VITAMIN D3) 50 MCG (2000 UT) CAPS Take by mouth.     Multiple Vitamin (MULTIVITAMIN WITH MINERALS) TABS tablet Take 1 tablet by mouth daily.     vitamin B-12 (CYANOCOBALAMIN) 500 MCG tablet Take 500 mcg by mouth daily.     Niacinamide POWD Apply 1 Application topically at bedtime. Qhs to dark spots on face for up to 3 months (Patient not taking: Reported on 03/25/2023) 30 g 2   Semaglutide-Weight Management 0.25 MG/0.5ML SOAJ Inject 0.25 mg into the skin once weekly for 4 weeks, then increase to 0.5 mg once weekly thereafter. (Patient not taking: Reported on 02/25/2023) 2 mL 0   No current facility-administered medications on file prior to visit.    No Known Allergies     Observations/Objective: There were no vitals filed for this visit. There is no height or weight on file to  calculate BMI.  Physical Exam Neurological:     Mental Status: She is alert.     CBC    Component Value Date/Time   WBC 11.4 (H) 02/25/2023 1004   RBC 4.53 02/25/2023 1004   HGB 13.2 02/25/2023 1004   HGB 10.5 (L) 04/22/2021 0923   HCT 40.2 02/25/2023 1004   HCT 30.7 (L) 04/22/2021 0923   PLT 335 02/25/2023 1004   PLT 336 04/22/2021 0923   MCV 88.7 02/25/2023 1004   MCV 86 04/22/2021 0923   MCV 92 03/13/2014 0740   MCH 29.1 02/25/2023 1004   MCHC 32.8 02/25/2023 1004   RDW 13.2 02/25/2023 1004   RDW 11.9 04/22/2021 0923   RDW 12.9 03/13/2014 0740   LYMPHSABS 3.4 02/25/2023 1004   LYMPHSABS  2.3 04/22/2021 0923   LYMPHSABS 1.8 03/13/2014 0740   MONOABS 0.8 02/25/2023 1004   MONOABS 1.6 (H) 03/13/2014 0740   EOSABS 0.3 02/25/2023 1004   EOSABS 0.4 04/22/2021 0923   EOSABS 0.1 03/13/2014 0740   BASOSABS 0.1 02/25/2023 1004   BASOSABS 0.0 04/22/2021 0923   BASOSABS 0.3 (H) 03/13/2014 0740    CMP     Component Value Date/Time   NA 138 02/17/2023 1233   K 4.9 02/17/2023 1233   CL 107 02/17/2023 1233   CO2 25 02/17/2023 1233   GLUCOSE 85 02/17/2023 1233   BUN 6 02/17/2023 1233   CREATININE 0.78 02/17/2023 1233   CALCIUM 9.6 02/17/2023 1233   PROT 7.0 02/17/2023 1233   ALBUMIN 4.2 02/17/2023 1233   AST 10 02/17/2023 1233   ALT 11 02/17/2023 1233   ALKPHOS 58 02/17/2023 1233   BILITOT 0.3 02/17/2023 1233   GFRNONAA >60 08/10/2021 1553    ASSESSMENT & PLAN:   Leukocytosis Labs reviewed and discussed with patient. She has mild leukocytosis with completely normal differential subgroups. Negative peripheral blood flow cytometry, negative M protein on protein after pheresis.  Normal LDH, negative HIV, negative hepatitis panel. Likely reactive process secondary to smoking cigar, chronic inflammation.  She has no constitutional symptoms I recommend observation.  Orders Placed This Encounter  Procedures   CBC with Differential (Cancer Center Only)    Standing Status:    Future    Standing Expiration Date:   03/24/2024   Technologist smear review    Standing Status:   Future    Standing Expiration Date:   03/24/2024    Order Specific Question:   Clinical information:    Answer:   leukocytosis   I discussed the assessment and treatment plan with the patient. The patient was provided an opportunity to ask questions and all were answered. The patient agreed with the plan and demonstrated an understanding of the instructions.  The patient was advised to call back or seek an in-person evaluation if the symptoms worsen or if the condition fails to improve as anticipated.   Follow-up in 6 months Rickard Patience, MD 03/25/2023 2:48 PM

## 2023-08-09 ENCOUNTER — Ambulatory Visit (INDEPENDENT_AMBULATORY_CARE_PROVIDER_SITE_OTHER): Payer: BC Managed Care – PPO | Admitting: Podiatry

## 2023-08-09 DIAGNOSIS — Q828 Other specified congenital malformations of skin: Secondary | ICD-10-CM | POA: Diagnosis not present

## 2023-08-09 DIAGNOSIS — M778 Other enthesopathies, not elsewhere classified: Secondary | ICD-10-CM | POA: Diagnosis not present

## 2023-08-09 NOTE — Progress Notes (Signed)
 Subjective:  Patient ID: Deborah Mccarthy, female    DOB: September 21, 1980,  MRN: [id number]  Chief Complaint  Patient presents with   Callouses    43 y.o. female presents with the above complaint.  Patient presents with left midfoot porokeratotic lesion with underlying capsulitis pain for touch is progressive and worse hurts with ambulation or shoe pressure.  S she states it came back and is causing her more issues.  She would like to do the same thing as last time.   Review of Systems: Negative except as noted in the HPI. Denies N/V/F/Ch.  Past Medical History:  Diagnosis Date   Abnormal Pap smear of vagina    Advanced maternal age in multigravida 11/14/2020   BV (bacterial vaginosis)    Cervical dysplasia    Chlamydia    Depression    Dysmenorrhea    Gestational diabetes    Hives    Menorrhagia    Ovarian cyst    Rectal ulcer    Negative for Crohn's   Vitamin D deficiency     Current Outpatient Medications:    ACCU-CHEK GUIDE test strip, USE AS INSTRUCTED FOUR TIMES A DAY, Disp: , Rfl:    Blood Glucose Monitoring Suppl (ACCU-CHEK GUIDE) w/Device KIT, 4 (four) times daily. use as directed, Disp: , Rfl:    Cholecalciferol (VITAMIN D3) 50 MCG (2000 UT) CAPS, Take by mouth., Disp: , Rfl:    Multiple Vitamin (MULTIVITAMIN WITH MINERALS) TABS tablet, Take 1 tablet by mouth daily., Disp: , Rfl:    Niacinamide POWD, Apply 1 Application topically at bedtime. Qhs to dark spots on face for up to 3 months (Patient not taking: Reported on 03/25/2023), Disp: 30 g, Rfl: 2   Semaglutide-Weight Management 0.25 MG/0.5ML SOAJ, Inject 0.25 mg into the skin once weekly for 4 weeks, then increase to 0.5 mg once weekly thereafter. (Patient not taking: Reported on 02/25/2023), Disp: 2 mL, Rfl: 0   vitamin B-12 (CYANOCOBALAMIN) 500 MCG tablet, Take 500 mcg by mouth daily., Disp: , Rfl:   Social History   Tobacco Use  Smoking Status Some Days   Types: Cigars  Smokeless Tobacco Never    No Known  Allergies Objective:  There were no vitals filed for this visit. There is no height or weight on file to calculate BMI. Constitutional Well developed. Well nourished.  Vascular Dorsalis pedis pulses palpable bilaterally. Posterior tibial pulses palpable bilaterally. Capillary refill normal to all digits.  No cyanosis or clubbing noted. Pedal hair growth normal.  Neurologic Normal speech. Oriented to person, place, and time. Epicritic sensation to light touch grossly present bilaterally.  Dermatologic Nails well groomed and normal in appearance. No open wounds. No skin lesions.  Orthopedic: Pain on palpation left midfoot porokeratotic lesion noted central nucleated core noted no pinpoint bleeding noted upon debridement   Radiographs: None Assessment:   No diagnosis found.   Plan:  Patient was evaluated and treated and all questions answered.  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  Patient agrees with plan like to proceed with steroid injection -A second steroid injection was performed at left midfoot using 1% plain Lidocaine and 10 mg of Kenalog. This was well tolerated. -I discussed shoe gear modification as well as orthotics management in the future. -Also discussed surgical options with her as well   No  follow-ups on file.

## 2023-08-17 ENCOUNTER — Encounter

## 2023-08-17 ENCOUNTER — Ambulatory Visit
Admission: RE | Admit: 2023-08-17 | Discharge: 2023-08-17 | Disposition: A | Discharge status: Home / Self Care | Source: Ambulatory Visit | Attending: Obstetrics and Gynecology | Admitting: Obstetrics and Gynecology

## 2023-08-17 DIAGNOSIS — Z1231 Encounter for screening mammogram for malignant neoplasm of breast: Secondary | ICD-10-CM | POA: Insufficient documentation

## 2023-08-21 ENCOUNTER — Encounter: Payer: Self-pay | Admitting: Obstetrics and Gynecology

## 2023-08-23 ENCOUNTER — Other Ambulatory Visit (HOSPITAL_COMMUNITY): Payer: Self-pay

## 2023-09-09 ENCOUNTER — Telehealth: Payer: Self-pay

## 2023-09-09 ENCOUNTER — Other Ambulatory Visit (HOSPITAL_COMMUNITY): Payer: Self-pay

## 2023-09-09 NOTE — Telephone Encounter (Signed)
 Pharmacy Patient Advocate Encounter   Received notification from CoverMyMeds that prior authorization for Wegovy  0.25MG /0.5ML auto-injectors  is required/requested.   Insurance verification completed.   The patient is insured through [facility] .   Per test claim: PA required; PA started via CoverMyMeds. KEY [id number] .   Prior Authorization form/request asks a question that requires your assistance. Please see the question below and advise accordingly. The PA will not be submitted until the necessary information is received.  Pt needs a weight check documented in chart with in the last 45 day pt last weight was in 02/25/2023

## 2023-09-12 NOTE — Telephone Encounter (Signed)
 Unable to reach patient. Left voicemail to return call to our office and schedule an appointment with Polly Brink for a weight f/u

## 2023-09-14 NOTE — Telephone Encounter (Signed)
 Called and spoke with patient, she states at this time she is not interested in resuming the Wegovy . She may revisit at another time and will schedule appt if she wants to resume.

## 2023-09-15 ENCOUNTER — Other Ambulatory Visit (HOSPITAL_COMMUNITY): Payer: Self-pay

## 2023-09-15 NOTE — Telephone Encounter (Signed)
 PA request has been Cancelled. New Encounter will be created for follow up. Pt is not interested in resuming at this time and will  revisit at another time will  schedule appt.

## 2023-09-23 ENCOUNTER — Inpatient Hospital Stay: Payer: BC Managed Care – PPO

## 2023-09-29 ENCOUNTER — Inpatient Hospital Stay: Payer: BC Managed Care – PPO | Admitting: Oncology

## 2023-10-03 ENCOUNTER — Other Ambulatory Visit (HOSPITAL_COMMUNITY): Payer: Self-pay

## 2023-10-03 ENCOUNTER — Encounter: Payer: Self-pay | Admitting: Primary Care

## 2023-10-03 ENCOUNTER — Telehealth: Payer: Self-pay | Admitting: Pharmacy Technician

## 2023-10-03 ENCOUNTER — Ambulatory Visit (INDEPENDENT_AMBULATORY_CARE_PROVIDER_SITE_OTHER): Admitting: Primary Care

## 2023-10-03 VITALS — BP 122/64 | HR 79 | Temp 98.1°F | Ht 61.0 in | Wt 174.0 lb

## 2023-10-03 DIAGNOSIS — E785 Hyperlipidemia, unspecified: Secondary | ICD-10-CM | POA: Diagnosis not present

## 2023-10-03 DIAGNOSIS — E6609 Other obesity due to excess calories: Secondary | ICD-10-CM

## 2023-10-03 DIAGNOSIS — E66811 Obesity, class 1: Secondary | ICD-10-CM | POA: Diagnosis not present

## 2023-10-03 DIAGNOSIS — R7303 Prediabetes: Secondary | ICD-10-CM | POA: Diagnosis not present

## 2023-10-03 DIAGNOSIS — Z6832 Body mass index (BMI) 32.0-32.9, adult: Secondary | ICD-10-CM

## 2023-10-03 LAB — LIPID PANEL
Cholesterol: 213 mg/dL — ABNORMAL HIGH (ref 0–200)
HDL: 32.5 mg/dL — ABNORMAL LOW (ref >39.00)
LDL Cholesterol: 156 mg/dL — ABNORMAL HIGH (ref 0–99)
NonHDL: 180.5
Total CHOL/HDL Ratio: 7
Triglycerides: 122 mg/dL (ref 0.0–149.0)
VLDL: 24.4 mg/dL (ref 0.0–40.0)

## 2023-10-03 LAB — HEMOGLOBIN A1C: Hgb A1c MFr Bld: 5.5 % (ref 4.6–6.5)

## 2023-10-03 MED ORDER — SEMAGLUTIDE-WEIGHT MANAGEMENT 0.25 MG/0.5ML ~~LOC~~ SOAJ: 0.2500 mg | SUBCUTANEOUS | 0 refills | Status: DC

## 2023-10-03 NOTE — Telephone Encounter (Signed)
 Great, thank you!

## 2023-10-03 NOTE — Patient Instructions (Addendum)
 Stop by the lab prior to leaving today. I will notify you of your results once received.   Start semaglutide  (Wegovy ) for weight loss in July. Start by injecting 0.25 mg into the skin once weekly for 4 weeks, then increase to 0.5 mg once weekly thereafter.  Please notify me once you have used your last 0.25 mg pen so that I can send the 0.5 mg dose to your pharmacy.  Please schedule a physical to meet with me for the 2nd week in October  It was a pleasure to see you today!

## 2023-10-03 NOTE — Assessment & Plan Note (Signed)
 Commended her on weight loss!  Repeat lipid panel pending.

## 2023-10-03 NOTE — Assessment & Plan Note (Signed)
 Commended her on weight loss, encouraged to continue!  Start semaglutide  (Wegovy ) for weight loss in July. Start by injecting 0.25 mg into the skin once weekly for 4 weeks, then increase to 0.5 mg once weekly thereafter.   Follow up in late September/early October 2025.

## 2023-10-03 NOTE — Progress Notes (Signed)
 Subjective:    Patient ID: Deborah Mccarthy, female    DOB: 02-04-1981, 43 y.o.   MRN: [id number]  HPI  Deborah Mccarthy is a very pleasant 43 y.o. female with a history of class I obesity due to excess calories, tobacco use, hyperglycemia, chronic fatigue who presents today to discuss obesity and weight loss options.  She was last evaluated on 02/17/2023, requested to start Wegovy  for assistance with weight loss.  A prescription was sent to her pharmacy at that time.   Since her last visit she never started the Wegovy  due to cost, but she plans on starting Wegovy  this Summer. She is needing a new prescription for prior authorization. She has been walking and gardening. She's also much less stressed, has cut back on sugary foods and drinks.   Diet currently consists of:  Breakfast: Skips  Lunch: Bagel, cottage cheese, leftovers  Dinner: Soup, sandwiches, pasta, chicken, meatloaf, burritos Snacks: Oatmeal with raisins  Desserts: On occasion  Beverages: Coffee, sometimes water, diet soda.     Wt Readings from Last 3 Encounters:  10/03/23 174 lb (78.9 kg)  02/25/23 184 lb 9.6 oz (83.7 kg)  02/17/23 185 lb (83.9 kg)     Review of Systems  Respiratory:  Negative for shortness of breath.   Cardiovascular:  Negative for chest pain.  Gastrointestinal:  Negative for constipation and diarrhea.         Past Medical History:  Diagnosis Date   Abnormal Pap smear of vagina    Advanced maternal age in multigravida 11/14/2020   BV (bacterial vaginosis)    Cervical dysplasia    Chlamydia    Depression    Dysmenorrhea    Gestational diabetes    Hives    Menorrhagia    Ovarian cyst    Rectal ulcer    Negative for Crohn's   Vitamin D  deficiency     Social History   Socioeconomic History   Marital status: Married    Spouse name: Not on file   Number of children: 3   Years of education: Not on file   Highest education level: Bachelor's degree (e.g., BA, AB, BS)  Occupational  History   Not on file  Tobacco Use   Smoking status: Some Days    Types: Cigars   Smokeless tobacco: Never  Vaping Use   Vaping status: Never Used  Substance and Sexual Activity   Alcohol use: No    Alcohol/week: 0.0 standard drinks of alcohol   Drug use: No   Sexual activity: Not Currently    Birth control/protection: Surgical  Other Topics Concern   Not on file  Social History Narrative   Married.   3 children.   Works as a Child psychotherapist.   Enjoys reading, gardening, relaxing.    Social Drivers of Corporate investment banker Strain: Low Risk  (02/17/2023)   Overall Financial Resource Strain (CARDIA)    Difficulty of Paying Living Expenses: Not very hard  Food Insecurity: No Food Insecurity (02/17/2023)   Hunger Vital Sign    Worried About Running Out of Food in the Last Year: Never true    Ran Out of Food in the Last Year: Never true  Transportation Needs: No Transportation Needs (02/17/2023)   PRAPARE - Administrator, Civil Service (Medical): No    Lack of Transportation (Non-Medical): No  Physical Activity: Insufficiently Active (02/17/2023)   Exercise Vital Sign    Days of Exercise per Week: 1 day  Minutes of Exercise per Session: 30 min  Stress: Stress Concern Present (02/17/2023)   [employer] of Occupational Health - Occupational Stress Questionnaire    Feeling of Stress : To some extent  Social Connections: Socially Integrated (02/17/2023)   Social Connection and Isolation Panel [NHANES]    Frequency of Communication with Friends and Family: Three times a week    Frequency of Social Gatherings with Friends and Family: Once a week    Attends Religious Services: More than 4 times per year    Active Member of [employer] or Organizations: Yes    Attends Engineer, structural: More than 4 times per year    Marital Status: Married  Catering manager Violence: Not on file    Past Surgical History:  Procedure Laterality Date   CRYOTHERAPY      FINGER SURGERY     PILONIDAL CYST EXCISION     WISDOM TOOTH EXTRACTION     XI ROBOTIC ASSISTED SALPINGECTOMY Bilateral 08/11/2021   Procedure: XI ROBOTIC ASSISTED SALPINGECTOMY;  Surgeon: Heron Lord, MD;  Location: [facility] ORS;  Service: Gynecology;  Laterality: Bilateral;    Family History  Problem Relation Age of Onset   Graves' disease Mother    Coronary artery disease Father    Diabetes Father    Breast cancer Maternal Grandmother 50   Coronary artery disease Maternal Grandmother    Colon cancer Maternal Grandfather    Colon cancer Other     No Known Allergies  Current Outpatient Medications on File Prior to Visit  Medication Sig Dispense Refill   ACCU-CHEK GUIDE test strip USE AS INSTRUCTED FOUR TIMES A DAY     Blood Glucose Monitoring Suppl (ACCU-CHEK GUIDE) w/Device KIT 4 (four) times daily. use as directed     Cholecalciferol (VITAMIN D3) 50 MCG (2000 UT) CAPS Take by mouth.     Multiple Vitamin (MULTIVITAMIN WITH MINERALS) TABS tablet Take 1 tablet by mouth daily.     vitamin B-12 (CYANOCOBALAMIN ) 500 MCG tablet Take 500 mcg by mouth daily.     Niacinamide  POWD Apply 1 Application topically at bedtime. Qhs to dark spots on face for up to 3 months (Patient not taking: Reported on 10/03/2023) 30 g 2   No current facility-administered medications on file prior to visit.    BP 122/64   Pulse 79   Temp 98.1 F (36.7 C) (Temporal)   Ht 5\' 1"  (1.549 m)   Wt 174 lb (78.9 kg)   LMP 09/23/2023   SpO2 98%   BMI 32.88 kg/m  Objective:   Physical Exam Cardiovascular:     Rate and Rhythm: Normal rate and regular rhythm.  Pulmonary:     Effort: Pulmonary effort is normal.     Breath sounds: Normal breath sounds.  Musculoskeletal:     Cervical back: Neck supple.  Skin:    General: Skin is warm and dry.  Neurological:     Mental Status: She is alert and oriented to person, place, and time.  Psychiatric:        Mood and Affect: Mood normal.            Assessment & Plan:  Hyperlipidemia, unspecified hyperlipidemia type Assessment & Plan: Commended her on weight loss! Repeat lipid panel pending.   Orders: -     Lipid panel -     Semaglutide -Weight Management; Inject 0.25 mg into the skin once a week.  Dispense: 2 mL; Refill: 0  Prediabetes -     Hemoglobin A1c -  Semaglutide -Weight Management; Inject 0.25 mg into the skin once a week.  Dispense: 2 mL; Refill: 0  Class 1 obesity due to excess calories without serious comorbidity with body mass index (BMI) of 32.0 to 32.9 in adult Assessment & Plan: Commended her on weight loss, encouraged to continue!  Start semaglutide  (Wegovy ) for weight loss in July. Start by injecting 0.25 mg into the skin once weekly for 4 weeks, then increase to 0.5 mg once weekly thereafter.   Follow up in late September/early October 2025.  Orders: -     Semaglutide -Weight Management; Inject 0.25 mg into the skin once a week.  Dispense: 2 mL; Refill: 0        Gabriel John, NP

## 2023-10-03 NOTE — Assessment & Plan Note (Signed)
 Commended her on weight loss! Repeat A1c pending.

## 2023-10-03 NOTE — Telephone Encounter (Signed)
 Looks like [facility] goofed, they sent it to [facility] for Ozempic . Let me look into this further.Aaron AasAaron AasAaron Aas

## 2023-10-03 NOTE — Telephone Encounter (Signed)
 Is this not the prescription for Wegovy ? Semaglutide  weight management? The same prescription was approved for [place] 6 months ago.

## 2023-10-03 NOTE — Telephone Encounter (Signed)

## 2023-10-03 NOTE — Telephone Encounter (Signed)
 Pharmacy Patient Advocate Encounter   Received notification from Physician's Office that prior authorization for Wegovy  0.25MG /0.5ML auto-injectors is required/requested.   Insurance verification completed.   The patient is insured through [facility] .   Per test claim: PA required; PA submitted to above mentioned insurance via CoverMyMeds Key/confirmation #/EOC [id number] Status is pending

## 2023-10-04 ENCOUNTER — Other Ambulatory Visit (HOSPITAL_COMMUNITY): Payer: Self-pay

## 2023-10-04 NOTE — Telephone Encounter (Signed)
 Pharmacy Patient Advocate Encounter  Received notification from [facility] that Prior Authorization for Wegovy  0.25MG /0.5ML auto-injectors has been APPROVED from 10/03/23 to 03/31/24   PA #/Case ID/Reference #: [id number]

## 2023-10-18 NOTE — Progress Notes (Signed)
 PCP:  Gabriel John, NP   Chief Complaint  Patient presents with   Gynecologic Exam    Discuss heavy and long cycles, sometimes can have 2 cycles in one month.     HPI:      [name] is a 43 y.o. [id number] whose LMP was Patient's last menstrual period was 09/23/2023 (exact date)., presents today for her annual examination.  Her menses are heavier and more irregular for the past yr. Menses now Q4-5 wks, lasting 7-14 days (if late, flow is longer and then next period comes on time so can have 2 periods a couple wks apart), heavy flow changing products hourly and with golf sized clots, no BTB if menses monthly, mod dysmen/LBP, improved with NSAIDs. Last yr, menses were monthly, lasting 5 days, mod flow.  Dysmenorrhea worsening as she gets older, no BTB. No VS sx. No hx of HTN, DVTs, migraines with aura. Did OCPs in past without problems. Takes MVI but not sure if has Fe. Has lost 16# since last annual and doing GLP-1 past few months only.   Sex activity: not sexually active. No vag sx. Gets some vaginal itching; using scented soap and dryer sheets.  Last Pap: 10/12/22 Results were NILM/neg HPV DNA  09/14/21 Results were: ASCUS /neg HPV DNA Hx of STDs: s/p cryotx; hx of chlamydia in past  (Hx of occas SUI, also has rectocele with BM that she has to press on to have BM. No constipation. Went to urogyn in past but didn't have time for surg/PT. Will let me know if wants pelvic PT ref.)  Last mammogram: 08/17/23 Results were: normal--routine follow-up in 12 months There is a FH of breast cancer in her MGM, genetic testing not indicated. There is no FH of ovarian cancer. The patient does do self-breast exams.  Tobacco use: former smoker, occas smokes cigars socially Alcohol use: none No drug use.  Exercise: moderately active  She does get adequate calcium and Vitamin D  in her diet. Labs with PCP  Patient Active Problem List   Diagnosis Date Noted   Hyperlipidemia 10/03/2023    Leukocytosis 02/25/2023   Encounter for annual general medical examination with abnormal findings in adult 02/17/2023   Class 1 obesity due to excess calories with body mass index (BMI) of 32.0 to 32.9 in adult 02/17/2023   Chronic fatigue 02/17/2023   ASCUS of cervix with negative high risk HPV 10/12/2022   Prediabetes 10/26/2017   Family history of Graves' disease 10/26/2017   Condyloma acuminata 01/04/2017    Past Surgical History:  Procedure Laterality Date   CRYOTHERAPY     FINGER SURGERY     PILONIDAL CYST EXCISION     WISDOM TOOTH EXTRACTION     XI ROBOTIC ASSISTED SALPINGECTOMY Bilateral 08/11/2021   Procedure: XI ROBOTIC ASSISTED SALPINGECTOMY;  Surgeon: Heron Lord, MD;  Location: [facility] ORS;  Service: Gynecology;  Laterality: Bilateral;    Family History  Problem Relation Age of Onset   Graves' disease Mother    Coronary artery disease Father    Diabetes Father    Breast cancer Maternal Grandmother 1   Coronary artery disease Maternal Grandmother    Colon cancer Maternal Grandfather    Colon cancer Other     Social History   Socioeconomic History   Marital status: Married    Spouse name: Not on file   Number of children: 3   Years of education: Not on file   Highest education level: Bachelor's degree (  e.g., BA, AB, BS)  Occupational History   Not on file  Tobacco Use   Smoking status: Some Days    Types: Cigars   Smokeless tobacco: Never  Vaping Use   Vaping status: Never Used  Substance and Sexual Activity   Alcohol use: No    Alcohol/week: 0.0 standard drinks of alcohol   Drug use: No   Sexual activity: Not Currently    Birth control/protection: Surgical  Other Topics Concern   Not on file  Social History Narrative   Married.   3 children.   Works as a Child psychotherapist.   Enjoys reading, gardening, relaxing.    Social Drivers of Corporate investment banker Strain: Low Risk  (02/17/2023)   Overall Financial Resource Strain (CARDIA)     Difficulty of Paying Living Expenses: Not very hard  Food Insecurity: No Food Insecurity (02/17/2023)   Hunger Vital Sign    Worried About Running Out of Food in the Last Year: Never true    Ran Out of Food in the Last Year: Never true  Transportation Needs: No Transportation Needs (02/17/2023)   PRAPARE - Administrator, Civil Service (Medical): No    Lack of Transportation (Non-Medical): No  Physical Activity: Insufficiently Active (02/17/2023)   Exercise Vital Sign    Days of Exercise per Week: 1 day    Minutes of Exercise per Session: 30 min  Stress: Stress Concern Present (02/17/2023)   [employer] of Occupational Health - Occupational Stress Questionnaire    Feeling of Stress : To some extent  Social Connections: Socially Integrated (02/17/2023)   Social Connection and Isolation Panel [NHANES]    Frequency of Communication with Friends and Family: Three times a week    Frequency of Social Gatherings with Friends and Family: Once a week    Attends Religious Services: More than 4 times per year    Active Member of [employer] or Organizations: Yes    Attends Engineer, structural: More than 4 times per year    Marital Status: Married  Catering manager Violence: Not on file     Current Outpatient Medications:    ACCU-CHEK GUIDE test strip, USE AS INSTRUCTED FOUR TIMES A DAY, Disp: , Rfl:    Blood Glucose Monitoring Suppl (ACCU-CHEK GUIDE) w/Device KIT, 4 (four) times daily. use as directed, Disp: , Rfl:    Cholecalciferol (VITAMIN D3) 50 MCG (2000 UT) CAPS, Take by mouth., Disp: , Rfl:    Multiple Vitamin (MULTIVITAMIN WITH MINERALS) TABS tablet, Take 1 tablet by mouth daily., Disp: , Rfl:    Semaglutide -Weight Management 0.25 MG/0.5ML SOAJ, Inject 0.25 mg into the skin once a week., Disp: 2 mL, Rfl: 0   vitamin B-12 (CYANOCOBALAMIN ) 500 MCG tablet, Take 500 mcg by mouth daily., Disp: , Rfl:      ROS:  Review of Systems  Constitutional:  Negative for  fatigue, fever and unexpected weight change.  Respiratory:  Negative for cough, shortness of breath and wheezing.   Cardiovascular:  Negative for chest pain, palpitations and leg swelling.  Gastrointestinal:  Negative for blood in stool, constipation, diarrhea, nausea and vomiting.  Endocrine: Negative for cold intolerance, heat intolerance and polyuria.  Genitourinary:  Positive for menstrual problem. Negative for dyspareunia, dysuria, flank pain, frequency, genital sores, hematuria, pelvic pain, urgency, vaginal bleeding, vaginal discharge and vaginal pain.  Musculoskeletal:  Negative for back pain, joint swelling and myalgias.  Skin:  Negative for rash.  Neurological:  Negative for dizziness, syncope,  light-headedness, numbness and headaches.  Hematological:  Negative for adenopathy.  Psychiatric/Behavioral:  Negative for agitation, confusion, sleep disturbance and suicidal ideas. The patient is not nervous/anxious.    BREAST: No symptoms   Objective: BP 123/72   Pulse 84   Ht 5\' 1"  (1.549 m)   Wt 167 lb (75.8 kg)   LMP 09/23/2023 (Exact Date)   BMI 31.55 kg/m    Physical Exam Constitutional:      Appearance: She is well-developed.  Genitourinary:     Vulva normal.     Right Labia: No rash, tenderness or lesions.    Left Labia: No tenderness, lesions or rash.    No vaginal discharge, erythema or tenderness.      Right Adnexa: not tender and no mass present.    Left Adnexa: not tender and no mass present.    No cervical friability or polyp.     Uterus is enlarged.     Uterus is not tender.  Breasts:    Right: No mass, nipple discharge, skin change or tenderness.     Left: No mass, nipple discharge, skin change or tenderness.  Neck:     Thyroid : No thyromegaly.  Cardiovascular:     Rate and Rhythm: Normal rate and regular rhythm.     Heart sounds: Normal heart sounds. No murmur heard. Pulmonary:     Effort: Pulmonary effort is normal.     Breath sounds: Normal breath  sounds.  Abdominal:     Palpations: Abdomen is soft.     Tenderness: There is no abdominal tenderness. There is no guarding or rebound.  Musculoskeletal:        General: Normal range of motion.     Cervical back: Normal range of motion.  Lymphadenopathy:     Cervical: No cervical adenopathy.  Neurological:     General: No focal deficit present.     Mental Status: She is alert and oriented to person, place, and time.     Cranial Nerves: No cranial nerve deficit.  Skin:    General: Skin is warm and dry.  Psychiatric:        Mood and Affect: Mood normal.        Behavior: Behavior normal.        Thought Content: Thought content normal.        Judgment: Judgment normal.  Vitals reviewed.    Assessment/Plan: Encounter for annual routine gynecological examination  Encounter for screening mammogram for malignant neoplasm of breast - Plan: MM 3D SCREENING MAMMOGRAM BILATERAL BREAST; pt to schedule mammo 3/26.   Abnormal uterine bleeding (AUB) - Plan: CBC with Differential/Platelet, TSH, T4, free, US  PELVIS TRANSVAGINAL NON-OB (TV ONLY); for the past yr; check labs and GYN u/s. Will f/u with results. If WNL, will call pt to discuss tx options.   Menometrorrhagia - Plan: CBC with Differential/Platelet, TSH, T4, free, US  PELVIS TRANSVAGINAL NON-OB (TV ONLY)            GYN counsel breast self exam, mammography screening, adequate intake of calcium and vitamin D , diet and exercise     F/U  Return in about 1 week (around 10/31/2023) for GYn u/s for AUB/menometrorrhagia--[facility] to call pt.  Rohnan Bartleson B. Curtina Grills, PA-C 10/24/2023 3:00 PM

## 2023-10-24 ENCOUNTER — Encounter: Payer: Self-pay | Admitting: Obstetrics and Gynecology

## 2023-10-24 ENCOUNTER — Ambulatory Visit (INDEPENDENT_AMBULATORY_CARE_PROVIDER_SITE_OTHER): Admitting: Obstetrics and Gynecology

## 2023-10-24 VITALS — BP 123/72 | HR 84 | Ht 61.0 in | Wt 167.0 lb

## 2023-10-24 DIAGNOSIS — N921 Excessive and frequent menstruation with irregular cycle: Secondary | ICD-10-CM

## 2023-10-24 DIAGNOSIS — N939 Abnormal uterine and vaginal bleeding, unspecified: Secondary | ICD-10-CM

## 2023-10-24 DIAGNOSIS — Z1231 Encounter for screening mammogram for malignant neoplasm of breast: Secondary | ICD-10-CM

## 2023-10-24 DIAGNOSIS — Z01419 Encounter for gynecological examination (general) (routine) without abnormal findings: Secondary | ICD-10-CM

## 2023-10-24 NOTE — Patient Instructions (Signed)
 I value your feedback and you entrusting [place] with your care. If you get a [place] patient survey, I would appreciate you taking the time to let us know about your experience today. Thank you! ? ? ?

## 2023-10-25 ENCOUNTER — Ambulatory Visit: Payer: Self-pay | Admitting: Obstetrics and Gynecology

## 2023-10-25 LAB — T4, FREE: Free T4: 1.06 ng/dL (ref 0.82–1.77)

## 2023-10-25 LAB — CBC WITH DIFFERENTIAL/PLATELET
Basophils Absolute: 0.1 10*3/uL (ref 0.0–0.2)
Basos: 1 %
EOS (ABSOLUTE): 0.2 10*3/uL (ref 0.0–0.4)
Eos: 2 %
Hematocrit: 44 % (ref 34.0–46.6)
Hemoglobin: 14.1 g/dL (ref 11.1–15.9)
Immature Grans (Abs): 0 10*3/uL (ref 0.0–0.1)
Immature Granulocytes: 0 %
Lymphocytes Absolute: 3.4 10*3/uL — ABNORMAL HIGH (ref 0.7–3.1)
Lymphs: 40 %
MCH: 29 pg (ref 26.6–33.0)
MCHC: 32 g/dL (ref 31.5–35.7)
MCV: 91 fL (ref 79–97)
Monocytes Absolute: 0.5 10*3/uL (ref 0.1–0.9)
Monocytes: 5 %
Neutrophils Absolute: 4.5 10*3/uL (ref 1.4–7.0)
Neutrophils: 52 %
Platelets: 332 10*3/uL (ref 150–450)
RBC: 4.86 x10E6/uL (ref 3.77–5.28)
RDW: 12.9 % (ref 11.7–15.4)
WBC: 8.6 10*3/uL (ref 3.4–10.8)

## 2023-10-25 LAB — TSH: TSH: 1.35 u[IU]/mL (ref 0.450–4.500)

## 2023-10-27 ENCOUNTER — Ambulatory Visit (INDEPENDENT_AMBULATORY_CARE_PROVIDER_SITE_OTHER): Admitting: Podiatry

## 2023-10-27 DIAGNOSIS — Z79899 Other long term (current) drug therapy: Secondary | ICD-10-CM

## 2023-10-27 DIAGNOSIS — B351 Tinea unguium: Secondary | ICD-10-CM

## 2023-10-27 NOTE — Progress Notes (Signed)
 Subjective:  Patient ID: Deborah Mccarthy, female    DOB: 20-Jun-1980,  MRN: [id number]  Chief Complaint  Patient presents with   Callouses    Pt stated that the places on her foot is causing her pain again    43 y.o. female presents with the above complaint.  Patient presents with complaint of left midfoot porokeratotic lesion with underlying capsulitis.  Patient states that is hurting again.  She would like to do another treatment options.  She also has secondary complaint of nail fungus.  She is tried topical medication which has not helped she would like to discuss oral medication   Review of Systems: Negative except as noted in the HPI. Denies N/V/F/Ch.  Past Medical History:  Diagnosis Date   Abnormal Pap smear of vagina    Advanced maternal age in multigravida 11/14/2020   BV (bacterial vaginosis)    Cervical dysplasia    Chlamydia    Depression    Dysmenorrhea    Gestational diabetes    Hives    Menorrhagia    Ovarian cyst    Rectal ulcer    Negative for Crohn's   Vitamin D  deficiency     Current Outpatient Medications:    ACCU-CHEK GUIDE test strip, USE AS INSTRUCTED FOUR TIMES A DAY, Disp: , Rfl:    Blood Glucose Monitoring Suppl (ACCU-CHEK GUIDE) w/Device KIT, 4 (four) times daily. use as directed, Disp: , Rfl:    Cholecalciferol (VITAMIN D3) 50 MCG (2000 UT) CAPS, Take by mouth., Disp: , Rfl:    Multiple Vitamin (MULTIVITAMIN WITH MINERALS) TABS tablet, Take 1 tablet by mouth daily., Disp: , Rfl:    Semaglutide -Weight Management 0.25 MG/0.5ML SOAJ, Inject 0.25 mg into the skin once a week., Disp: 2 mL, Rfl: 0   vitamin B-12 (CYANOCOBALAMIN ) 500 MCG tablet, Take 500 mcg by mouth daily., Disp: , Rfl:   Social History   Tobacco Use  Smoking Status Some Days   Types: Cigars  Smokeless Tobacco Never    No Known Allergies Objective:  There were no vitals filed for this visit. There is no height or weight on file to calculate BMI. Constitutional Well  developed. Well nourished.  Vascular Dorsalis pedis pulses palpable bilaterally. Posterior tibial pulses palpable bilaterally. Capillary refill normal to all digits.  No cyanosis or clubbing noted. Pedal hair growth normal.  Neurologic Normal speech. Oriented to person, place, and time. Epicritic sensation to light touch grossly present bilaterally.  Dermatologic Thickened elongated dystrophic mycotic nail with onychomycosis toenails x 10 No open wounds. No skin lesions.  Orthopedic: Pain on palpation left midfoot porokeratotic lesion noted central nucleated core noted no pinpoint bleeding noted upon debridement   Radiographs: None Assessment:   1. Long-term use of high-risk medication   2. Nail fungus   3. Onychomycosis due to dermatophyte      Plan:  Patient was evaluated and treated and all questions answered.  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  Patient agrees with plan like to proceed with steroid injection - Another steroid injection was performed at left midfoot using 1% plain Lidocaine  and 10 mg of Kenalog . This was well tolerated. -I discussed shoe gear modification as well as orthotics management in the future. -Also discussed surgical options with her as well  Onychomycosis toenails x 10 -Educated the patient  on the etiology of onychomycosis and various treatment options associated with improving the fungal load.  I explained to the patient that there is 3 treatment options available to treat the onychomycosis including topical, p.o., laser treatment.  Patient elected to undergo p.o. options with Lamisil/terbinafine therapy.  In order for me to start the medication therapy, I explained to the patient the importance of evaluating the liver and  obtaining the liver function test.  Once the liver function test comes back normal I will start him on 27-month course of Lamisil therapy.  Patient understood all risk and would like to proceed with Lamisil therapy.  I have asked the patient to immediately stop the Lamisil therapy if she has any reactions to it and call the office or go to the emergency room right away.  Patient states understanding    No follow-ups on file.

## 2023-11-01 LAB — HEPATIC FUNCTION PANEL
ALT: 8 IU/L (ref 0–32)
AST: 14 IU/L (ref 0–40)
Albumin: 4.4 g/dL (ref 3.9–4.9)
Alkaline Phosphatase: 82 IU/L (ref 44–121)
Bilirubin Total: <0.2 mg/dL (ref 0.0–1.2)
Total Protein: 7.1 g/dL (ref 6.0–8.5)

## 2023-11-01 MED ORDER — TERBINAFINE HCL 250 MG PO TABS: 250.0000 mg | ORAL_TABLET | Freq: Every day | ORAL | 0 refills | Status: DC

## 2023-11-01 NOTE — Addendum Note (Signed)
 Addended by: Shreeya Recendiz on: 11/01/2023 10:49 AM   Modules accepted: Orders

## 2023-11-07 ENCOUNTER — Other Ambulatory Visit

## 2023-11-23 ENCOUNTER — Ambulatory Visit

## 2023-11-23 DIAGNOSIS — N939 Abnormal uterine and vaginal bleeding, unspecified: Secondary | ICD-10-CM

## 2023-11-23 DIAGNOSIS — N921 Excessive and frequent menstruation with irregular cycle: Secondary | ICD-10-CM

## 2023-11-29 ENCOUNTER — Telehealth: Payer: Self-pay | Admitting: Obstetrics and Gynecology

## 2023-11-29 NOTE — Telephone Encounter (Signed)
 Pt aware of neg GYN u/s results. Discussed BC, depo, IUD, ablation, lysteda. Pt to consider and f/u with questions/decisions. Not sure she wants to do hormones, may want to do ablation or lysteda.

## 2023-12-02 ENCOUNTER — Inpatient Hospital Stay

## 2023-12-08 ENCOUNTER — Telehealth: Admitting: Oncology

## 2024-03-07 ENCOUNTER — Encounter: Payer: Self-pay | Admitting: Primary Care

## 2024-03-07 ENCOUNTER — Ambulatory Visit: Admitting: Primary Care

## 2024-03-07 VITALS — BP 118/64 | HR 82 | Temp 97.3°F | Ht 61.0 in | Wt 158.0 lb

## 2024-03-07 DIAGNOSIS — R7303 Prediabetes: Secondary | ICD-10-CM | POA: Diagnosis not present

## 2024-03-07 DIAGNOSIS — E785 Hyperlipidemia, unspecified: Secondary | ICD-10-CM | POA: Diagnosis not present

## 2024-03-07 DIAGNOSIS — E663 Overweight: Secondary | ICD-10-CM

## 2024-03-07 DIAGNOSIS — Z23 Encounter for immunization: Secondary | ICD-10-CM | POA: Diagnosis not present

## 2024-03-07 DIAGNOSIS — Z0000 Encounter for general adult medical examination without abnormal findings: Secondary | ICD-10-CM | POA: Diagnosis not present

## 2024-03-07 LAB — HEMOGLOBIN A1C: Hgb A1c MFr Bld: 5.5 % (ref 4.6–6.5)

## 2024-03-07 LAB — COMPREHENSIVE METABOLIC PANEL WITH GFR
ALT: 4 U/L (ref 0–35)
AST: 8 U/L (ref 0–37)
Albumin: 4.2 g/dL (ref 3.5–5.2)
Alkaline Phosphatase: 53 U/L (ref 39–117)
BUN: 5 mg/dL — ABNORMAL LOW (ref 6–23)
CO2: 26 meq/L (ref 19–32)
Calcium: 9.1 mg/dL (ref 8.4–10.5)
Chloride: 105 meq/L (ref 96–112)
Creatinine, Ser: 0.72 mg/dL (ref 0.40–1.20)
GFR: 101.17 mL/min (ref >60.00)
Glucose, Bld: 86 mg/dL (ref 70–99)
Potassium: 4.7 meq/L (ref 3.5–5.1)
Sodium: 137 meq/L (ref 135–145)
Total Bilirubin: 0.3 mg/dL (ref 0.2–1.2)
Total Protein: 6.6 g/dL (ref 6.0–8.3)

## 2024-03-07 LAB — LIPID PANEL
Cholesterol: 216 mg/dL — ABNORMAL HIGH (ref 0–200)
HDL: 29.6 mg/dL — ABNORMAL LOW (ref >39.00)
LDL Cholesterol: 155 mg/dL — ABNORMAL HIGH (ref 0–99)
NonHDL: 186.19
Total CHOL/HDL Ratio: 7
Triglycerides: 155 mg/dL — ABNORMAL HIGH (ref 0.0–149.0)
VLDL: 31 mg/dL (ref 0.0–40.0)

## 2024-03-07 NOTE — Progress Notes (Signed)
 Subjective:    Patient ID: Deborah Mccarthy, female    DOB: 1981-04-06, 43 y.o.   MRN: [id number]  Deborah Mccarthy is a very pleasant 43 y.o. female who presents today for complete physical and follow up of chronic conditions.  Immunizations: -Tetanus: Completed in 2023 -Influenza: Influenza vaccine provided today.   Diet: Fair diet.  Exercise: No regular exercise.  Eye exam: Completes annually  Dental exam: Completes semi-annually    Pap Smear: Completed in 2024 Mammogram: Completed in March 2025  Colonoscopy: Completed > 10 years ago.   BP Readings from Last 3 Encounters:  03/07/24 118/64  10/24/23 123/72  10/03/23 122/64   Wt Readings from Last 3 Encounters:  03/07/24 158 lb (71.7 kg)  10/24/23 167 lb (75.8 kg)  10/03/23 174 lb (78.9 kg)   Body mass index is 29.85 kg/m.   Review of Systems  Constitutional:  Negative for unexpected weight change.  HENT:  Negative for rhinorrhea.   Respiratory:  Negative for cough and shortness of breath.   Cardiovascular:  Negative for chest pain.  Gastrointestinal:  Negative for constipation and diarrhea.  Genitourinary:  Negative for difficulty urinating.  Musculoskeletal:  Negative for arthralgias and myalgias.  Skin:  Negative for rash.  Allergic/Immunologic: Negative for environmental allergies.  Neurological:  Negative for dizziness and headaches.  Psychiatric/Behavioral:  The patient is not nervous/anxious.          Past Medical History:  Diagnosis Date   Abnormal Pap smear of vagina    Advanced maternal age in multigravida 11/14/2020   BV (bacterial vaginosis)    Cervical dysplasia    Chlamydia    Depression    Dysmenorrhea    Gestational diabetes    Hives    Menorrhagia    Ovarian cyst    Rectal ulcer    Negative for Crohn's   Vitamin D  deficiency     Social History   Socioeconomic History   Marital status: Married    Spouse name: Not on file   Number of children: 3   Years of education: Not on  file   Highest education level: Bachelor's degree (e.g., BA, AB, BS)  Occupational History   Not on file  Tobacco Use   Smoking status: Some Days    Types: Cigars   Smokeless tobacco: Never  Vaping Use   Vaping status: Never Used  Substance and Sexual Activity   Alcohol use: No    Alcohol/week: 0.0 standard drinks of alcohol   Drug use: No   Sexual activity: Not Currently    Birth control/protection: Surgical  Other Topics Concern   Not on file  Social History Narrative   Married.   3 children.   Works as a Child psychotherapist.   Enjoys reading, gardening, relaxing.    Social Drivers of Corporate investment banker Strain: Low Risk  (02/17/2023)   Overall Financial Resource Strain (CARDIA)    Difficulty of Paying Living Expenses: Not very hard  Food Insecurity: No Food Insecurity (02/17/2023)   Hunger Vital Sign    Worried About Running Out of Food in the Last Year: Never true    Ran Out of Food in the Last Year: Never true  Transportation Needs: No Transportation Needs (02/17/2023)   PRAPARE - Administrator, Civil Service (Medical): No    Lack of Transportation (Non-Medical): No  Physical Activity: Insufficiently Active (02/17/2023)   Exercise Vital Sign    Days of Exercise per Week: 1 day  Minutes of Exercise per Session: 30 min  Stress: Stress Concern Present (02/17/2023)   [employer] of Occupational Health - Occupational Stress Questionnaire    Feeling of Stress : To some extent  Social Connections: Socially Integrated (02/17/2023)   Social Connection and Isolation Panel    Frequency of Communication with Friends and Family: Three times a week    Frequency of Social Gatherings with Friends and Family: Once a week    Attends Religious Services: More than 4 times per year    Active Member of [employer] or Organizations: Yes    Attends Engineer, structural: More than 4 times per year    Marital Status: Married  Catering manager Violence: Not on file     Past Surgical History:  Procedure Laterality Date   CRYOTHERAPY     FINGER SURGERY     PILONIDAL CYST EXCISION     WISDOM TOOTH EXTRACTION     XI ROBOTIC ASSISTED SALPINGECTOMY Bilateral 08/11/2021   Procedure: XI ROBOTIC ASSISTED SALPINGECTOMY;  Surgeon: Victor Claudell SAUNDERS, MD;  Location: [facility] OR[facility];  Service: Gynecology;  Laterality: Bilateral;    Family History  Problem Relation Age of Onset   Graves' disease Mother    Coronary artery disease Father    Diabetes Father    Breast cancer Maternal Grandmother 50   Coronary artery disease Maternal Grandmother    Colon cancer Maternal Grandfather    Colon cancer Other     No Known Allergies  Current Outpatient Medications on File Prior to Visit  Medication Sig Dispense Refill   ACCU-CHEK GUIDE test strip USE AS INSTRUCTED FOUR TIMES A DAY     Blood Glucose Monitoring Suppl (ACCU-CHEK GUIDE) w/Device KIT 4 (four) times daily. use as directed     Cholecalciferol (VITAMIN D3) 50 MCG (2000 UT) CAPS Take by mouth.     Multiple Vitamin (MULTIVITAMIN WITH MINERALS) TABS tablet Take 1 tablet by mouth daily.     tirzepatide (MOUNJARO) 10 MG/0.5ML Pen Inject 10 mg into the skin once a week.     vitamin B-12 (CYANOCOBALAMIN ) 500 MCG tablet Take 500 mcg by mouth daily.     No current facility-administered medications on file prior to visit.    BP 118/64   Pulse 82   Temp (!) 97.3 F (36.3 C) (Temporal)   Ht 5' 1 (1.549 m)   Wt 158 lb (71.7 kg)   LMP 03/05/2024   SpO2 100%   BMI 29.85 kg/m  Objective:   Physical Exam HENT:     Right Ear: Tympanic membrane and ear canal normal.     Left Ear: Tympanic membrane and ear canal normal.  Eyes:     Pupils: Pupils are equal, round, and reactive to light.  Cardiovascular:     Rate and Rhythm: Normal rate and regular rhythm.  Pulmonary:     Effort: Pulmonary effort is normal.     Breath sounds: Normal breath sounds.  Abdominal:     General: Bowel sounds are normal.      Palpations: Abdomen is soft.     Tenderness: There is no abdominal tenderness.  Musculoskeletal:        General: Normal range of motion.     Cervical back: Neck supple.  Skin:    General: Skin is warm and dry.  Neurological:     Mental Status: She is alert and oriented to person, place, and time.     Cranial Nerves: No cranial nerve deficit.     Deep  Tendon Reflexes:     Reflex Scores:      Patellar reflexes are 2+ on the right side and 2+ on the left side. Psychiatric:        Mood and Affect: Mood normal.     Physical Exam        Assessment & Plan:  Prediabetes Assessment & Plan: Repeat A1C pending.  Commended her on weight loss.  Orders: -     Hemoglobin A1c  Hyperlipidemia, unspecified hyperlipidemia type Assessment & Plan: Repeat lipid panel pending.    Orders: -     Lipid panel -     Comprehensive metabolic panel with GFR  Overweight (BMI 25.0-29.9) Assessment & Plan: Commended her on weight loss. Continue Tirzepatide 10 mg weekly per online provider.      Preventative health care Assessment & Plan: Immunizations UTD. Influenza vaccine provided today.  Pap smear UTD. Mammogram UTD Colonoscopy due, she declines for now.  Discussed the importance of a healthy diet and regular exercise in order for weight loss, and to reduce the risk of further co-morbidity.  Exam stable. Labs pending.  Follow up in 1 year for repeat physical.      Assessment and Plan Assessment & Plan         Comer MARLA Gaskins, NP    History of Present Illness

## 2024-03-07 NOTE — Assessment & Plan Note (Signed)
 Immunizations UTD. Influenza vaccine provided today.  Pap smear UTD. Mammogram UTD Colonoscopy due, she declines for now.  Discussed the importance of a healthy diet and regular exercise in order for weight loss, and to reduce the risk of further co-morbidity.  Exam stable. Labs pending.  Follow up in 1 year for repeat physical.

## 2024-03-07 NOTE — Assessment & Plan Note (Signed)
 Commended her on weight loss. Continue Tirzepatide 10 mg weekly per online provider.

## 2024-03-07 NOTE — Patient Instructions (Signed)
 Stop by the lab prior to leaving today. I will notify you of your results once received.   It was a pleasure to see you today!

## 2024-03-07 NOTE — Assessment & Plan Note (Signed)
Repeat A1C pending.  Commended her on weight loss. 

## 2024-03-07 NOTE — Assessment & Plan Note (Signed)
 Repeat lipid panel pending.

## 2024-03-08 ENCOUNTER — Ambulatory Visit: Payer: Self-pay | Admitting: Primary Care

## 2024-03-27 ENCOUNTER — Ambulatory Visit: Admitting: Podiatry

## 2024-03-27 ENCOUNTER — Ambulatory Visit

## 2024-03-27 DIAGNOSIS — M216X2 Other acquired deformities of left foot: Secondary | ICD-10-CM | POA: Diagnosis not present

## 2024-03-27 DIAGNOSIS — Q828 Other specified congenital malformations of skin: Secondary | ICD-10-CM

## 2024-03-27 DIAGNOSIS — B351 Tinea unguium: Secondary | ICD-10-CM

## 2024-03-27 DIAGNOSIS — Z79899 Other long term (current) drug therapy: Secondary | ICD-10-CM

## 2024-03-27 MED ORDER — TERBINAFINE HCL 250 MG PO TABS: 250.0000 mg | ORAL_TABLET | Freq: Every day | ORAL | 0 refills | Status: AC

## 2024-03-27 NOTE — Progress Notes (Addendum)
 "  Subjective:  Patient ID: Deborah Mccarthy, female    DOB: 07-31-1980,  MRN: [id number]  Chief Complaint  Patient presents with   Porokeratosis    43 y.o. female presents with the above complaint.  Patient presents with complaint of right midfoot porokeratotic lesion with underlying capsulitis.  She also had complaints of left second and fifth plantarflexed metatarsal painful. She states is causing more more discomfort she would like to discuss surgical options she has tried shoe gear modification previous debridement none of which has helped denies any other acute complaints   Review of Systems: Negative except as noted in the HPI. Denies N/V/F/Ch.  Past Medical History:  Diagnosis Date   Abnormal Pap smear of vagina    Advanced maternal age in multigravida 11/14/2020   BV (bacterial vaginosis)    Cervical dysplasia    Chlamydia    Depression    Dysmenorrhea    Gestational diabetes    Hives    Menorrhagia    Ovarian cyst    Rectal ulcer    Negative for Crohn's   Vitamin D  deficiency     Current Outpatient Medications:    terbinafine  (LAMISIL ) 250 MG tablet, Take 1 tablet (250 mg total) by mouth daily., Disp: 90 tablet, Rfl: 0   ACCU-CHEK GUIDE test strip, USE AS INSTRUCTED FOUR TIMES A DAY, Disp: , Rfl:    Blood Glucose Monitoring Suppl (ACCU-CHEK GUIDE) w/Device KIT, 4 (four) times daily. use as directed, Disp: , Rfl:    Cholecalciferol (VITAMIN D3) 50 MCG (2000 UT) CAPS, Take by mouth., Disp: , Rfl:    Multiple Vitamin (MULTIVITAMIN WITH MINERALS) TABS tablet, Take 1 tablet by mouth daily., Disp: , Rfl:    tirzepatide (MOUNJARO) 10 MG/0.5ML Pen, Inject 10 mg into the skin once a week., Disp: , Rfl:    vitamin B-12 (CYANOCOBALAMIN ) 500 MCG tablet, Take 500 mcg by mouth daily., Disp: , Rfl:   Social History   Tobacco Use  Smoking Status Some Days   Types: Cigars  Smokeless Tobacco Never    No Known Allergies Objective:  There were no vitals filed for this  visit. There is no height or weight on file to calculate BMI. Constitutional Well developed. Well nourished.  Vascular Dorsalis pedis pulses palpable bilaterally. Posterior tibial pulses palpable bilaterally. Capillary refill normal to all digits.  No cyanosis or clubbing noted. Pedal hair growth normal.  Neurologic Normal speech. Oriented to person, place, and time. Epicritic sensation to light touch grossly present bilaterally.  Dermatologic Thickened elongated dystrophic mycotic nail with onychomycosis toenails x 10 No open wounds. No skin lesions.  Orthopedic: Pain on palpation right midfoot porokeratotic lesion noted central nucleated core noted no pinpoint bleeding noted upon debridement.   Left Plantarflexed 2nd and 5th metatarsal noted with submetatarsal 5 and second lesion.  Pain on palpation.   Radiographs: None Assessment:   1. Porokeratosis      Plan:  Patient was evaluated and treated and all questions answered.  Right mid foot porokeratosis with underlying capsulitis -All questions and concerns were discussed with the patient in extensive detail given the amount of pain that she is having without any benefits I believe she would benefit from surgical excision of the midfoot Porokeratotic lesion she states understanding would like to proceed with surgery I discussed my preoperative postop plan with the patient in extensive detail she states understanding -Informed surgical risk consent was reviewed and read aloud to the patient.  I reviewed the films.  I have  discussed my findings with the patient in great detail.  I have discussed all risks including but not limited to infection, stiffness, scarring, limp, disability, deformity, damage to blood vessels and nerves, numbness, poor healing, need for braces, arthritis, chronic pain, amputation, death.  All benefits and realistic expectations discussed in great detail.  I have made no promises as to the outcome.  I have provided  realistic expectations.  I have offered the patient a 2nd opinion, which they have declined and assured me they preferred to proceed despite the risks  Left 2nd and 5th plantarflexed metatarsal - Given the amount of pain that she is having under submetatarsal 2 and 5 due to ambulation patient will benefit from floating osteotomy of the fifth metatarsal-discussed the preoperative postop plan with the patient in extensive detail she states understanding like to proceed with surgery -Informed surgical risk consent was reviewed and read aloud to the patient.  I reviewed the films.  I have discussed my findings with the patient in great detail.  I have discussed all risks including but not limited to infection, stiffness, scarring, limp, disability, deformity, damage to blood vessels and nerves, numbness, poor healing, need for braces, arthritis, chronic pain, amputation, death.  All benefits and realistic expectations discussed in great detail.  I have made no promises as to the outcome.  I have provided realistic expectations.  I have offered the patient a 2nd opinion, which they have declined and assured me they preferred to proceed despite the risks    Onychomycosis toenails x 10~second round -Educated the patient on the etiology of onychomycosis and various treatment options associated with improving the fungal load.  I explained to the patient that there is 3 treatment options available to treat the onychomycosis including topical, p.o., laser treatment.  Patient elected to undergo p.o. options with Lamisil /terbinafine  therapy.  In order for me to start the medication therapy, I explained to the patient the importance of evaluating the liver and obtaining the liver function test.  Once the liver function test comes back normal I will start him on 44-month course of Lamisil  therapy.  Patient understood all risk and would like to proceed with Lamisil  therapy.  I have asked the patient to immediately stop the  Lamisil  therapy if she has any reactions to it and call the office or go to the emergency room right away.  Patient states understanding    "

## 2024-04-02 ENCOUNTER — Other Ambulatory Visit (HOSPITAL_COMMUNITY): Payer: Self-pay

## 2024-04-03 ENCOUNTER — Telehealth: Payer: Self-pay

## 2024-04-03 ENCOUNTER — Other Ambulatory Visit (HOSPITAL_COMMUNITY): Payer: Self-pay

## 2024-04-03 NOTE — Telephone Encounter (Signed)
 Prior Authorization form/request asks a question that requires your assistance. Please see the question below and advise accordingly. The PA will not be submitted until the necessary information is received.   This PA request is for Wegovy  0.25.  Patient has Mounjaro rx that started 03/04/24. Which medication is patient on?

## 2024-04-03 NOTE — Telephone Encounter (Signed)
 According to 03/07/24 OV notes, pt is to continue tirzepatide (Mounjaro) 10 mg wkly.   Spoke with pt to confirm above info. Pt states she is taking a compound version of Mounjaro. Not taking Wegovy .

## 2024-04-09 ENCOUNTER — Other Ambulatory Visit (HOSPITAL_COMMUNITY): Payer: Self-pay

## 2024-04-09 ENCOUNTER — Telehealth: Payer: Self-pay | Admitting: Podiatry

## 2024-04-09 NOTE — Telephone Encounter (Signed)
 Called and patient is scheduled for surgery on 05/28/2024. Patient will be on GLP1 medications by this time and has been advised that she will need to discontinue 7 days prior. Not on any blood thinners. Patient pharmacy correct in chart.

## 2024-05-02 ENCOUNTER — Telehealth: Payer: Self-pay | Admitting: Podiatry

## 2024-05-02 NOTE — Telephone Encounter (Signed)
 Recd [employer] forms and faxed [phone] RTW apporox 06/25/2024 DOS 05/28/24

## 2024-05-09 ENCOUNTER — Telehealth: Payer: Self-pay | Admitting: Podiatry

## 2024-05-09 NOTE — Telephone Encounter (Signed)
 DOS- 05/28/2024  2ND+5TH METATARSAL OSTEOTOMY LT- 71691 EXC BENIGN LESION OVER 4.0 CM RT- 11426  [employer] EFFECTIVE DATE- 11/22/2023  DEDUCTIBLE- $2000 REMAINING- $1197.64 OOP- $4000 REMAINING- $6802.35 COINSURANCE- 30%  PER BCBS PORTAL, PRIOR AUTH IS NOT REQUIRED FOR CPT CODES 71691 AND 11426.

## 2024-05-28 ENCOUNTER — Other Ambulatory Visit: Payer: Self-pay | Admitting: Podiatry

## 2024-05-28 DIAGNOSIS — D492 Neoplasm of unspecified behavior of bone, soft tissue, and skin: Secondary | ICD-10-CM | POA: Diagnosis not present

## 2024-05-28 DIAGNOSIS — M216X2 Other acquired deformities of left foot: Secondary | ICD-10-CM | POA: Diagnosis not present

## 2024-05-28 MED ORDER — OXYCODONE-ACETAMINOPHEN 5-325 MG PO TABS: 1.0000 | ORAL_TABLET | ORAL | 0 refills | Status: AC | PRN

## 2024-05-28 MED ORDER — IBUPROFEN 800 MG PO TABS: 800.0000 mg | ORAL_TABLET | Freq: Four times a day (QID) | ORAL | 1 refills | Status: AC | PRN

## 2024-05-30 ENCOUNTER — Encounter: Payer: Self-pay | Admitting: Podiatry

## 2024-05-30 ENCOUNTER — Encounter: Payer: Self-pay | Admitting: Lab

## 2024-05-30 NOTE — Progress Notes (Signed)
"  error  "

## 2024-06-05 ENCOUNTER — Other Ambulatory Visit: Payer: Self-pay | Admitting: Podiatry

## 2024-06-05 ENCOUNTER — Ambulatory Visit (INDEPENDENT_AMBULATORY_CARE_PROVIDER_SITE_OTHER)

## 2024-06-05 ENCOUNTER — Ambulatory Visit (INDEPENDENT_AMBULATORY_CARE_PROVIDER_SITE_OTHER): Admitting: Podiatry

## 2024-06-05 DIAGNOSIS — Z9889 Other specified postprocedural states: Secondary | ICD-10-CM

## 2024-06-05 DIAGNOSIS — M216X2 Other acquired deformities of left foot: Secondary | ICD-10-CM | POA: Diagnosis not present

## 2024-06-05 NOTE — Progress Notes (Signed)
 "  Subjective:  Patient ID: Deborah Mccarthy, female    DOB: [birth year],  MRN: [id number]  Chief Complaint  Patient presents with   Routine Post Op    POV #1 DOS 05/28/2024 LT 2ND AND 5TH METATARSAL OSTEOTOMY, RT EXC BENIGN LESION OVER 4.0 CM     DOS: 05/28/2024 Procedure: Left 2nd and 5th metatarsal osteotomy and right excision of benign skin lesion  44 y.o. female returns for post-op check.  She states that she is doing well denies any other acute complaints.  Bandages clean dry and intact pain is controlled  Review of Systems: Negative except as noted in the HPI. Denies N/V/F/Ch.  Past Medical History:  Diagnosis Date   Abnormal Pap smear of vagina    Advanced maternal age in multigravida 11/14/2020   BV (bacterial vaginosis)    Cervical dysplasia    Chlamydia    Depression    Dysmenorrhea    Gestational diabetes    Hives    Menorrhagia    Ovarian cyst    Rectal ulcer    Negative for Crohn's   Vitamin D  deficiency    Current Medications[1]  Tobacco Use History[2]  Allergies[3] Objective:  There were no vitals filed for this visit. There is no height or weight on file to calculate BMI. Constitutional Well developed. Well nourished.  Vascular Foot warm and well perfused. Capillary refill normal to all digits.   Neurologic Normal speech. Oriented to person, place, and time. Epicritic sensation to light touch grossly present bilaterally.  Dermatologic Skin healing well without signs of infection. Skin edges well coapted without signs of infection.  Orthopedic: Tenderness to palpation noted about the surgical site.   Radiographs: 3 views of skeletally mature adult bilateral foot: Osteotomy sites are doing well reduction of deformity noted.  Dorsal medial shift and the capital fragment noted.  All within normal limits Assessment:   1. Plantar flexed metatarsal, left   2. Status post foot surgery    Plan:  Patient was evaluated and treated and all questions  answered.  S/p foot surgery bilaterally -Progressing as expected post-operatively. -XR: See above -WB Status: Weightbearing as tolerated to the left side and partial weightbearing to the heel on the right side in surgical shoe -Sutures: Intact.  No clinical signs of dehiscence noted no complication noted. -Medications: None -Foot redressed.  No follow-ups on file.     [1]  Current Outpatient Medications:    ACCU-CHEK GUIDE test strip, USE AS INSTRUCTED FOUR TIMES A DAY, Disp: , Rfl:    Blood Glucose Monitoring Suppl (ACCU-CHEK GUIDE) w/Device KIT, 4 (four) times daily. use as directed, Disp: , Rfl:    Cholecalciferol (VITAMIN D3) 50 MCG (2000 UT) CAPS, Take by mouth., Disp: , Rfl:    ibuprofen  (ADVIL ) 800 MG tablet, Take 1 tablet (800 mg total) by mouth every 6 (six) hours as needed., Disp: 60 tablet, Rfl: 1   Multiple Vitamin (MULTIVITAMIN WITH MINERALS) TABS tablet, Take 1 tablet by mouth daily., Disp: , Rfl:    oxyCODONE -acetaminophen  (PERCOCET) 5-325 MG tablet, Take 1 tablet by mouth every 4 (four) hours as needed for severe pain (pain score 7-10)., Disp: 30 tablet, Rfl: 0   terbinafine  (LAMISIL ) 250 MG tablet, Take 1 tablet (250 mg total) by mouth daily., Disp: 90 tablet, Rfl: 0   tirzepatide (MOUNJARO) 10 MG/0.5ML Pen, Inject 10 mg into the skin once a week., Disp: , Rfl:    vitamin B-12 (CYANOCOBALAMIN ) 500 MCG tablet, Take 500 mcg by mouth daily.,  Disp: , Rfl:  [2]  Social History Tobacco Use  Smoking Status Some Days   Types: Cigars  Smokeless Tobacco Never  [3] No Known Allergies  "

## 2024-06-07 ENCOUNTER — Telehealth: Payer: Self-pay | Admitting: Lab

## 2024-06-07 ENCOUNTER — Other Ambulatory Visit: Payer: Self-pay | Admitting: Podiatry

## 2024-06-07 MED ORDER — OXYCODONE-ACETAMINOPHEN 5-325 MG PO TABS: 1.0000 | ORAL_TABLET | ORAL | 0 refills | Status: AC | PRN

## 2024-06-07 NOTE — Telephone Encounter (Signed)
 Refill on pain medication needed please call in.

## 2024-06-19 ENCOUNTER — Ambulatory Visit (INDEPENDENT_AMBULATORY_CARE_PROVIDER_SITE_OTHER): Admitting: Podiatry

## 2024-06-19 ENCOUNTER — Encounter: Payer: Self-pay | Admitting: Podiatry

## 2024-06-19 DIAGNOSIS — Z9889 Other specified postprocedural states: Secondary | ICD-10-CM

## 2024-06-19 DIAGNOSIS — M216X2 Other acquired deformities of left foot: Secondary | ICD-10-CM

## 2024-06-19 NOTE — Progress Notes (Signed)
 "  Subjective:  Patient ID: Deborah Mccarthy, female    DOB: [birth year],  MRN: [id number]  Chief Complaint  Patient presents with   Routine Post Op     POV #2 DOS 05/28/2024 LT 2ND AND 5TH METATARSAL OSTEOTOMY, RT EXC BENIGN LESION OVER 4.0 CM. She states that everything is fine. No concerns     DOS: 05/28/2024 Procedure: Left 2nd and 5th metatarsal osteotomy and right excision of benign skin lesion  44 y.o. female returns for post-op check.  She states that she is doing well denies any other acute complaints.  She has been doing good right weightbearing as tolerated with surgical shoe denies any other acute complaints  Review of Systems: Negative except as noted in the HPI. Denies N/V/F/Ch.  Past Medical History:  Diagnosis Date   Abnormal Pap smear of vagina    Advanced maternal age in multigravida 11/14/2020   BV (bacterial vaginosis)    Cervical dysplasia    Chlamydia    Depression    Dysmenorrhea    Gestational diabetes    Hives    Menorrhagia    Ovarian cyst    Rectal ulcer    Negative for Crohn's   Vitamin D  deficiency    Current Medications[1]  Tobacco Use History[2]  Allergies[3] Objective:  There were no vitals filed for this visit. There is no height or weight on file to calculate BMI. Constitutional Well developed. Well nourished.  Vascular Foot warm and well perfused. Capillary refill normal to all digits.   Neurologic Normal speech. Oriented to person, place, and time. Epicritic sensation to light touch grossly present bilaterally.  Dermatologic Completely epithelialized no signs of dehiscence noted.  Reduction of pressure deformities noted.  Orthopedic: No further tenderness to palpation noted about the surgical site.   Radiographs: 3 views of skeletally mature adult bilateral foot: Osteotomy sites are doing well reduction of deformity noted.  Dorsal medial shift and the capital fragment noted.  All within normal limits Assessment:   No diagnosis  found.  Plan:  Patient was evaluated and treated and all questions answered.  S/p foot surgery bilaterally - Skin completely reepithelialized reduction of deformity noted and pressure.  At this time I discussed with her that she can return to regular activities no restrictions.  No signs of recurrence noted.  If any foot and ankle issues on future she will come back and see me  No follow-ups on file.      [1]  Current Outpatient Medications:    ACCU-CHEK GUIDE test strip, USE AS INSTRUCTED FOUR TIMES A DAY, Disp: , Rfl:    Blood Glucose Monitoring Suppl (ACCU-CHEK GUIDE) w/Device KIT, 4 (four) times daily. use as directed, Disp: , Rfl:    Cholecalciferol (VITAMIN D3) 50 MCG (2000 UT) CAPS, Take by mouth., Disp: , Rfl:    ibuprofen  (ADVIL ) 800 MG tablet, Take 1 tablet (800 mg total) by mouth every 6 (six) hours as needed., Disp: 60 tablet, Rfl: 1   Multiple Vitamin (MULTIVITAMIN WITH MINERALS) TABS tablet, Take 1 tablet by mouth daily., Disp: , Rfl:    oxyCODONE -acetaminophen  (PERCOCET) 5-325 MG tablet, Take 1 tablet by mouth every 4 (four) hours as needed for severe pain (pain score 7-10)., Disp: 30 tablet, Rfl: 0   oxyCODONE -acetaminophen  (PERCOCET) 5-325 MG tablet, Take 1 tablet by mouth every 4 (four) hours as needed for severe pain (pain score 7-10)., Disp: 30 tablet, Rfl: 0   terbinafine  (LAMISIL ) 250 MG tablet, Take 1 tablet (250 mg total) by  mouth daily., Disp: 90 tablet, Rfl: 0   tirzepatide (MOUNJARO) 10 MG/0.5ML Pen, Inject 10 mg into the skin once a week., Disp: , Rfl:    vitamin B-12 (CYANOCOBALAMIN ) 500 MCG tablet, Take 500 mcg by mouth daily., Disp: , Rfl:  [2]  Social History Tobacco Use  Smoking Status Some Days   Types: Cigars  Smokeless Tobacco Never  [3] No Known Allergies  "

## 2025-03-08 ENCOUNTER — Encounter: Admitting: Primary Care
